# Patient Record
Sex: Female | Born: 1970 | Race: Black or African American | Hispanic: No | Marital: Single | Smoking: Former smoker
Health system: Southern US, Community
[De-identification: ages and names within clinical notes are randomized; demographics above are authoritative.]

## PROBLEM LIST (undated history)

## (undated) DIAGNOSIS — I10 Essential (primary) hypertension: Secondary | ICD-10-CM

## (undated) DIAGNOSIS — K429 Umbilical hernia without obstruction or gangrene: Secondary | ICD-10-CM

## (undated) DIAGNOSIS — E7800 Pure hypercholesterolemia, unspecified: Secondary | ICD-10-CM

## (undated) DIAGNOSIS — R7303 Prediabetes: Secondary | ICD-10-CM

## (undated) DIAGNOSIS — R0602 Shortness of breath: Secondary | ICD-10-CM

## (undated) DIAGNOSIS — E119 Type 2 diabetes mellitus without complications: Secondary | ICD-10-CM

## (undated) DIAGNOSIS — E669 Obesity, unspecified: Secondary | ICD-10-CM

## (undated) DIAGNOSIS — G4730 Sleep apnea, unspecified: Secondary | ICD-10-CM

## (undated) DIAGNOSIS — D649 Anemia, unspecified: Secondary | ICD-10-CM

## (undated) DIAGNOSIS — M549 Dorsalgia, unspecified: Secondary | ICD-10-CM

## (undated) DIAGNOSIS — F419 Anxiety disorder, unspecified: Secondary | ICD-10-CM

## (undated) DIAGNOSIS — R928 Other abnormal and inconclusive findings on diagnostic imaging of breast: Secondary | ICD-10-CM

## (undated) HISTORY — DX: Type 2 diabetes mellitus without complications: E11.9

## (undated) HISTORY — DX: Dorsalgia, unspecified: M54.9

## (undated) HISTORY — DX: Shortness of breath: R06.02

## (undated) HISTORY — PX: TUBAL LIGATION: SHX77

## (undated) HISTORY — DX: Prediabetes: R73.03

## (undated) HISTORY — DX: Obesity, unspecified: E66.9

## (undated) HISTORY — DX: Pure hypercholesterolemia, unspecified: E78.00

## (undated) HISTORY — DX: Sleep apnea, unspecified: G47.30

---
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
## 1997-12-29 ENCOUNTER — Other Ambulatory Visit: Admission: RE | Admit: 1997-12-29 | Discharge: 1997-12-29 | Payer: Self-pay | Admitting: Gynecology

## 1998-02-06 ENCOUNTER — Other Ambulatory Visit: Admission: RE | Admit: 1998-02-06 | Discharge: 1998-02-06 | Payer: Self-pay | Admitting: Gynecology

## 1998-04-17 ENCOUNTER — Other Ambulatory Visit: Admission: RE | Admit: 1998-04-17 | Discharge: 1998-04-17 | Payer: Self-pay | Admitting: Gynecology

## 1998-05-14 ENCOUNTER — Inpatient Hospital Stay (HOSPITAL_COMMUNITY): Admission: AD | Admit: 1998-05-14 | Discharge: 1998-05-18 | Payer: Self-pay | Admitting: Gynecology

## 1998-07-03 ENCOUNTER — Other Ambulatory Visit: Admission: RE | Admit: 1998-07-03 | Discharge: 1998-07-03 | Payer: Self-pay | Admitting: Obstetrics and Gynecology

## 2001-05-01 ENCOUNTER — Other Ambulatory Visit: Admission: RE | Admit: 2001-05-01 | Discharge: 2001-05-01 | Payer: Self-pay | Admitting: Obstetrics and Gynecology

## 2001-12-30 ENCOUNTER — Emergency Department (HOSPITAL_COMMUNITY): Admission: EM | Admit: 2001-12-30 | Discharge: 2001-12-30 | Payer: Self-pay | Admitting: Emergency Medicine

## 2001-12-30 ENCOUNTER — Encounter: Payer: Self-pay | Admitting: Emergency Medicine

## 2002-03-02 ENCOUNTER — Emergency Department (HOSPITAL_COMMUNITY): Admission: EM | Admit: 2002-03-02 | Discharge: 2002-03-02 | Payer: Self-pay | Admitting: Emergency Medicine

## 2005-09-20 ENCOUNTER — Other Ambulatory Visit: Admission: RE | Admit: 2005-09-20 | Discharge: 2005-09-20 | Payer: Self-pay | Admitting: Obstetrics and Gynecology

## 2006-11-14 ENCOUNTER — Other Ambulatory Visit: Admission: RE | Admit: 2006-11-14 | Discharge: 2006-11-14 | Payer: Self-pay | Admitting: Obstetrics and Gynecology

## 2007-12-11 ENCOUNTER — Other Ambulatory Visit: Admission: RE | Admit: 2007-12-11 | Discharge: 2007-12-11 | Payer: Self-pay | Admitting: Obstetrics and Gynecology

## 2009-01-16 ENCOUNTER — Other Ambulatory Visit: Admission: RE | Admit: 2009-01-16 | Discharge: 2009-01-16 | Payer: Self-pay | Admitting: Obstetrics and Gynecology

## 2009-01-16 ENCOUNTER — Ambulatory Visit: Payer: Self-pay | Admitting: Obstetrics and Gynecology

## 2009-01-16 ENCOUNTER — Encounter: Payer: Self-pay | Admitting: Obstetrics and Gynecology

## 2010-03-12 ENCOUNTER — Ambulatory Visit: Payer: Self-pay | Admitting: Obstetrics and Gynecology

## 2010-03-12 ENCOUNTER — Other Ambulatory Visit: Admission: RE | Admit: 2010-03-12 | Discharge: 2010-03-12 | Payer: Self-pay | Admitting: Obstetrics and Gynecology

## 2011-07-01 ENCOUNTER — Encounter: Payer: Self-pay | Admitting: Gynecology

## 2011-07-05 ENCOUNTER — Other Ambulatory Visit: Payer: Self-pay | Admitting: *Deleted

## 2011-07-05 ENCOUNTER — Encounter: Payer: Self-pay | Admitting: Obstetrics and Gynecology

## 2011-07-05 ENCOUNTER — Other Ambulatory Visit (HOSPITAL_COMMUNITY)
Admission: RE | Admit: 2011-07-05 | Discharge: 2011-07-05 | Disposition: A | Discharge status: Home / Self Care | Payer: BC Managed Care – PPO | Source: Ambulatory Visit | Attending: Obstetrics and Gynecology | Admitting: Obstetrics and Gynecology

## 2011-07-05 ENCOUNTER — Ambulatory Visit (INDEPENDENT_AMBULATORY_CARE_PROVIDER_SITE_OTHER): Payer: BC Managed Care – PPO | Admitting: Obstetrics and Gynecology

## 2011-07-05 VITALS — BP 124/72 | Ht 61.0 in | Wt 240.0 lb

## 2011-07-05 DIAGNOSIS — Z309 Encounter for contraceptive management, unspecified: Secondary | ICD-10-CM

## 2011-07-05 DIAGNOSIS — Z01419 Encounter for gynecological examination (general) (routine) without abnormal findings: Secondary | ICD-10-CM | POA: Insufficient documentation

## 2011-07-05 DIAGNOSIS — Z833 Family history of diabetes mellitus: Secondary | ICD-10-CM

## 2011-07-05 DIAGNOSIS — D649 Anemia, unspecified: Secondary | ICD-10-CM

## 2011-07-05 DIAGNOSIS — E7800 Pure hypercholesterolemia, unspecified: Secondary | ICD-10-CM

## 2011-07-05 MED ORDER — FERROUS SULFATE 325 (65 FE) MG PO TABS: 325.0000 mg | ORAL_TABLET | Freq: Every day | ORAL | Status: DC

## 2011-07-05 MED ORDER — NORGESTIM-ETH ESTRAD TRIPHASIC 0.18/0.215/0.25 MG-35 MCG PO TABS: 1.0000 | ORAL_TABLET | Freq: Every day | ORAL | Status: DC

## 2011-07-05 NOTE — Progress Notes (Signed)
The patient came to see me today for her annual GYN exam. She remains on birth control pills with excellent results. She was treated for strep throat elsewhere a month ago. She was treated with a Z-Pak. She is now asymptomatic but her lymph nodes are still slightly swollen.  Physical examination: HEENT within normal limits. Neck: Thyroid not large. Slightly enlarged lymph nodes bilaterally. Supraclavicular nodes: not enlarged. Breasts: Examined in both sitting midline position. No skin changes and no masses. Abdomen: Soft no guarding rebound or masses or hernia. Pelvic: External: Within normal limits. BUS: Within normal limits. Vaginal:within normal limits. Good estrogen effect. No evidence of cystocele rectocele or enterocele. Cervix: clean. Uterus: Normal size and shape. Adnexa: No masses. Rectovaginal exam: Confirmatory and negative. Extremities: Within normal limits.  Assessment: Normal GYN exam. Strep throat treated.  Plan: Continue oral contraceptives. Mammogram at 40. Lipid profile done as cholesterol was slightly elevated last year. Patient was fasted this year. If lymph node swelling does not disappear we will refer her to ENT.

## 2012-05-04 ENCOUNTER — Other Ambulatory Visit: Payer: Self-pay | Admitting: Obstetrics and Gynecology

## 2012-05-04 ENCOUNTER — Other Ambulatory Visit (HOSPITAL_COMMUNITY): Payer: Self-pay | Admitting: *Deleted

## 2012-05-04 DIAGNOSIS — Z1231 Encounter for screening mammogram for malignant neoplasm of breast: Secondary | ICD-10-CM

## 2012-05-07 ENCOUNTER — Ambulatory Visit (INDEPENDENT_AMBULATORY_CARE_PROVIDER_SITE_OTHER): Payer: BC Managed Care – PPO | Admitting: Obstetrics and Gynecology

## 2012-05-07 ENCOUNTER — Encounter: Payer: Self-pay | Admitting: Obstetrics and Gynecology

## 2012-05-07 DIAGNOSIS — N912 Amenorrhea, unspecified: Secondary | ICD-10-CM

## 2012-05-07 DIAGNOSIS — Z113 Encounter for screening for infections with a predominantly sexual mode of transmission: Secondary | ICD-10-CM

## 2012-05-07 NOTE — Addendum Note (Signed)
Addended by: Trellis Paganini on: 05/07/2012 05:07 PM   Modules accepted: Orders

## 2012-05-07 NOTE — Patient Instructions (Signed)
Call for pregnancy test results.

## 2012-05-07 NOTE — Progress Notes (Signed)
Patient came to see me today having not had a cycle for 3 months. She's had in frequent intercourse and uses condoms. Over the past week she started to feel different and felt she might be pregnant. She did a home pregnancy test which was positive. She does not want to be pregnant.  Exam: Kennon Portela present.Pelvic exam: External within normal limits. BUS within normal limits. Vaginal exam within normal limits. Cervix is clean without lesions. Uterus top normal size and shape. Adnexa failed to reveal masses.   Assessment: Probable early  intrauterine pregnancy  Plan: Quantitative hCG. If positive pelvic ultrasound. We did have a preliminary  discussion about therapeutic abortion.

## 2012-05-08 ENCOUNTER — Ambulatory Visit (HOSPITAL_COMMUNITY): Payer: BC Managed Care – PPO

## 2012-05-08 LAB — HIV ANTIBODY (ROUTINE TESTING W REFLEX): HIV: NONREACTIVE

## 2012-05-11 ENCOUNTER — Ambulatory Visit (INDEPENDENT_AMBULATORY_CARE_PROVIDER_SITE_OTHER): Payer: BC Managed Care – PPO | Admitting: Obstetrics and Gynecology

## 2012-05-11 ENCOUNTER — Ambulatory Visit (INDEPENDENT_AMBULATORY_CARE_PROVIDER_SITE_OTHER): Payer: BC Managed Care – PPO

## 2012-05-11 ENCOUNTER — Other Ambulatory Visit: Payer: Self-pay | Admitting: Obstetrics and Gynecology

## 2012-05-11 DIAGNOSIS — O0930 Supervision of pregnancy with insufficient antenatal care, unspecified trimester: Secondary | ICD-10-CM

## 2012-05-11 DIAGNOSIS — O09529 Supervision of elderly multigravida, unspecified trimester: Secondary | ICD-10-CM

## 2012-05-11 DIAGNOSIS — N912 Amenorrhea, unspecified: Secondary | ICD-10-CM

## 2012-05-11 DIAGNOSIS — Z3490 Encounter for supervision of normal pregnancy, unspecified, unspecified trimester: Secondary | ICD-10-CM

## 2012-05-11 DIAGNOSIS — Z331 Pregnant state, incidental: Secondary | ICD-10-CM

## 2012-05-11 DIAGNOSIS — O2690 Pregnancy related conditions, unspecified, unspecified trimester: Secondary | ICD-10-CM

## 2012-05-11 LAB — US OB COMP + 14 WK

## 2012-05-12 NOTE — Progress Notes (Signed)
I reviewed patient's ultrasound which she did in our office yesterday. It shows a 17 week intrauterine pregnancy. An incomplete anatomy survey was done due to gestational age and fetal position. There was good fetal movement. The cervix was long and closed. I called the patient today and told her how far pregnant she was. She had been interested in termination of pregnancy. I told her I did not do terminations that far along. I called Dr. Ilda Mori who also said he did not do them at that point. He suggested she called the abortion clinic in [place]. I discussed this with the patient today. She is now leaning towards having a pregnancy. I referred her to [facility]  OB/GYN. I told her she should be seen immediately. She asked if the baby was normal. I told her we saw her no abnormality but was too soon to be sure and she needed a complete anatomy study within the next 2 weeks.

## 2012-05-12 NOTE — Patient Instructions (Signed)
Call [facility] immediately for prenatal care. If interested in termination call the abortion clinic in [place].

## 2012-05-15 ENCOUNTER — Encounter: Payer: BC Managed Care – PPO | Admitting: Obstetrics and Gynecology

## 2012-05-21 ENCOUNTER — Other Ambulatory Visit: Payer: Self-pay

## 2012-05-21 ENCOUNTER — Other Ambulatory Visit (HOSPITAL_COMMUNITY): Payer: Self-pay | Admitting: Obstetrics and Gynecology

## 2012-05-21 DIAGNOSIS — Z3689 Encounter for other specified antenatal screening: Secondary | ICD-10-CM

## 2012-05-21 DIAGNOSIS — O4400 Placenta previa specified as without hemorrhage, unspecified trimester: Secondary | ICD-10-CM

## 2012-05-21 DIAGNOSIS — O09529 Supervision of elderly multigravida, unspecified trimester: Secondary | ICD-10-CM

## 2012-05-21 LAB — OB RESULTS CONSOLE ABO/RH

## 2012-05-21 LAB — OB RESULTS CONSOLE GC/CHLAMYDIA
Chlamydia: NEGATIVE
Gonorrhea: NEGATIVE

## 2012-05-26 ENCOUNTER — Ambulatory Visit (HOSPITAL_COMMUNITY)
Admission: RE | Admit: 2012-05-26 | Discharge: 2012-05-26 | Disposition: A | Discharge status: Home / Self Care | Payer: Medicaid Other | Source: Ambulatory Visit | Attending: Obstetrics and Gynecology | Admitting: Obstetrics and Gynecology

## 2012-05-26 ENCOUNTER — Encounter (HOSPITAL_COMMUNITY): Payer: Self-pay

## 2012-05-26 DIAGNOSIS — O09529 Supervision of elderly multigravida, unspecified trimester: Secondary | ICD-10-CM

## 2012-05-26 DIAGNOSIS — Z363 Encounter for antenatal screening for malformations: Secondary | ICD-10-CM | POA: Insufficient documentation

## 2012-05-26 DIAGNOSIS — O99210 Obesity complicating pregnancy, unspecified trimester: Secondary | ICD-10-CM | POA: Insufficient documentation

## 2012-05-26 DIAGNOSIS — Z1389 Encounter for screening for other disorder: Secondary | ICD-10-CM | POA: Insufficient documentation

## 2012-05-26 DIAGNOSIS — Z3689 Encounter for other specified antenatal screening: Secondary | ICD-10-CM

## 2012-05-26 DIAGNOSIS — E669 Obesity, unspecified: Secondary | ICD-10-CM | POA: Insufficient documentation

## 2012-05-26 DIAGNOSIS — O358XX0 Maternal care for other (suspected) fetal abnormality and damage, not applicable or unspecified: Secondary | ICD-10-CM | POA: Insufficient documentation

## 2012-05-26 DIAGNOSIS — O4400 Placenta previa specified as without hemorrhage, unspecified trimester: Secondary | ICD-10-CM | POA: Insufficient documentation

## 2012-05-26 DIAGNOSIS — O0930 Supervision of pregnancy with insufficient antenatal care, unspecified trimester: Secondary | ICD-10-CM | POA: Insufficient documentation

## 2012-05-26 DIAGNOSIS — O34219 Maternal care for unspecified type scar from previous cesarean delivery: Secondary | ICD-10-CM | POA: Insufficient documentation

## 2012-07-16 ENCOUNTER — Other Ambulatory Visit (HOSPITAL_COMMUNITY): Payer: Self-pay | Admitting: Obstetrics and Gynecology

## 2012-07-16 DIAGNOSIS — O4420 Partial placenta previa NOS or without hemorrhage, unspecified trimester: Secondary | ICD-10-CM

## 2012-07-16 DIAGNOSIS — O09529 Supervision of elderly multigravida, unspecified trimester: Secondary | ICD-10-CM

## 2012-07-20 ENCOUNTER — Ambulatory Visit (HOSPITAL_COMMUNITY)
Admission: RE | Admit: 2012-07-20 | Discharge: 2012-07-20 | Disposition: A | Discharge status: Home / Self Care | Payer: Medicaid Other | Source: Ambulatory Visit | Attending: Obstetrics and Gynecology | Admitting: Obstetrics and Gynecology

## 2012-07-20 DIAGNOSIS — O09529 Supervision of elderly multigravida, unspecified trimester: Secondary | ICD-10-CM

## 2012-07-20 DIAGNOSIS — O4400 Placenta previa specified as without hemorrhage, unspecified trimester: Secondary | ICD-10-CM | POA: Insufficient documentation

## 2012-07-20 DIAGNOSIS — O4420 Partial placenta previa NOS or without hemorrhage, unspecified trimester: Secondary | ICD-10-CM

## 2012-07-20 DIAGNOSIS — E669 Obesity, unspecified: Secondary | ICD-10-CM | POA: Insufficient documentation

## 2012-07-20 DIAGNOSIS — O34219 Maternal care for unspecified type scar from previous cesarean delivery: Secondary | ICD-10-CM | POA: Insufficient documentation

## 2012-07-20 DIAGNOSIS — O0930 Supervision of pregnancy with insufficient antenatal care, unspecified trimester: Secondary | ICD-10-CM | POA: Insufficient documentation

## 2012-07-20 DIAGNOSIS — O99210 Obesity complicating pregnancy, unspecified trimester: Secondary | ICD-10-CM | POA: Insufficient documentation

## 2012-07-20 NOTE — Progress Notes (Addendum)
Ms. Venuti had an ultrasound appointment today.  Please see [facility]-OB/[facility] report for details.  Comments There is an active singleton fetus with no apparent dysmorphic features on today's routine anatomic re-examination.  The biometry suggests a fetus with an EFW at the approximately 65th percentile for gestational age.    Impression Active singleton fetus. Normal interval growth. Normal amniotic fluid volume Placenta is implanted along the left lateral uterine wall without evidence of previa. Today's images complete our anatomic survey without apparent structural defect. Extreme AMA (>36 years old)  Recommendations 1. Repeat interval growth assessment by ultrasound is recommended for your patient in 4 weeks in context of extremely advanced AMA. 2. Follow as clinically indicated.  Rogelia Boga, MD, MS, FACOG Assistant Professor Section of Maternal-Fetal Medicine [facility]

## 2012-07-20 NOTE — Addendum Note (Signed)
Encounter addended by: Durwin Nora, MD on: 07/20/2012 11:01 AM<BR>     Documentation filed: Notes Section

## 2012-07-24 ENCOUNTER — Other Ambulatory Visit: Payer: Self-pay

## 2012-07-29 ENCOUNTER — Encounter: Payer: Medicaid Other | Attending: Obstetrics and Gynecology | Admitting: *Deleted

## 2012-07-29 VITALS — Ht 61.0 in | Wt 267.3 lb

## 2012-07-29 DIAGNOSIS — O99810 Abnormal glucose complicating pregnancy: Secondary | ICD-10-CM | POA: Insufficient documentation

## 2012-07-29 DIAGNOSIS — Z713 Dietary counseling and surveillance: Secondary | ICD-10-CM | POA: Insufficient documentation

## 2012-07-29 DIAGNOSIS — O24419 Gestational diabetes mellitus in pregnancy, unspecified control: Secondary | ICD-10-CM

## 2012-07-30 ENCOUNTER — Encounter: Payer: Self-pay | Admitting: *Deleted

## 2012-07-30 NOTE — Progress Notes (Signed)
  Patient was seen on 07/29/12 for Gestational Diabetes self-management class at the [facility]. The following learning objectives were met by the patient during this course:   States the definition of Gestational Diabetes  States why dietary management is important in controlling blood glucose  Describes the effects each nutrient has on blood glucose levels  Demonstrates ability to create a balanced meal plan  Demonstrates carbohydrate counting   States when to check blood glucose levels  Demonstrates proper blood glucose monitoring techniques  States the effect of stress and exercise on blood glucose levels  States the importance of limiting caffeine and abstaining from alcohol and smoking  Blood glucose monitor given: Cecilie Lowers Lot # [id number] X Exp: 8/14 Blood glucose reading: 118 mg/dL  Patient instructed to monitor glucose levels: FBS: 60 - <90 2 hour: <120  *Patient received handouts:  Nutrition Diabetes and Pregnancy  Carbohydrate Counting List  Patient will be seen for follow-up as needed.

## 2012-07-30 NOTE — Patient Instructions (Signed)
Goals:  Check glucose levels per MD as instructed  Follow Gestational Diabetes Diet as instructed  Call for follow-up as needed    

## 2012-08-14 ENCOUNTER — Other Ambulatory Visit (HOSPITAL_COMMUNITY): Payer: Self-pay | Admitting: Obstetrics and Gynecology

## 2012-08-14 DIAGNOSIS — O09529 Supervision of elderly multigravida, unspecified trimester: Secondary | ICD-10-CM

## 2012-08-17 ENCOUNTER — Ambulatory Visit (HOSPITAL_COMMUNITY): Payer: BC Managed Care – PPO

## 2012-08-18 ENCOUNTER — Ambulatory Visit (HOSPITAL_COMMUNITY)
Admission: RE | Admit: 2012-08-18 | Discharge: 2012-08-18 | Disposition: A | Discharge status: Home / Self Care | Payer: Medicaid Other | Source: Ambulatory Visit | Attending: Obstetrics and Gynecology | Admitting: Obstetrics and Gynecology

## 2012-08-18 DIAGNOSIS — O0930 Supervision of pregnancy with insufficient antenatal care, unspecified trimester: Secondary | ICD-10-CM | POA: Insufficient documentation

## 2012-08-18 DIAGNOSIS — O09529 Supervision of elderly multigravida, unspecified trimester: Secondary | ICD-10-CM | POA: Insufficient documentation

## 2012-08-18 DIAGNOSIS — O34219 Maternal care for unspecified type scar from previous cesarean delivery: Secondary | ICD-10-CM | POA: Insufficient documentation

## 2012-08-18 DIAGNOSIS — E669 Obesity, unspecified: Secondary | ICD-10-CM | POA: Insufficient documentation

## 2012-08-19 ENCOUNTER — Other Ambulatory Visit (HOSPITAL_COMMUNITY): Payer: Self-pay | Admitting: Maternal and Fetal Medicine

## 2012-08-19 DIAGNOSIS — O09529 Supervision of elderly multigravida, unspecified trimester: Secondary | ICD-10-CM

## 2012-08-19 DIAGNOSIS — O99210 Obesity complicating pregnancy, unspecified trimester: Secondary | ICD-10-CM

## 2012-09-18 ENCOUNTER — Ambulatory Visit (HOSPITAL_COMMUNITY)
Admission: RE | Admit: 2012-09-18 | Discharge: 2012-09-18 | Disposition: A | Discharge status: Home / Self Care | Payer: Medicaid Other | Source: Ambulatory Visit | Attending: Maternal and Fetal Medicine | Admitting: Maternal and Fetal Medicine

## 2012-09-18 ENCOUNTER — Encounter (HOSPITAL_COMMUNITY): Payer: Self-pay

## 2012-09-18 DIAGNOSIS — E669 Obesity, unspecified: Secondary | ICD-10-CM | POA: Insufficient documentation

## 2012-09-18 DIAGNOSIS — O0930 Supervision of pregnancy with insufficient antenatal care, unspecified trimester: Secondary | ICD-10-CM | POA: Insufficient documentation

## 2012-09-18 DIAGNOSIS — O09529 Supervision of elderly multigravida, unspecified trimester: Secondary | ICD-10-CM | POA: Insufficient documentation

## 2012-09-18 DIAGNOSIS — O99810 Abnormal glucose complicating pregnancy: Secondary | ICD-10-CM | POA: Insufficient documentation

## 2012-09-18 DIAGNOSIS — O34219 Maternal care for unspecified type scar from previous cesarean delivery: Secondary | ICD-10-CM | POA: Insufficient documentation

## 2012-09-18 DIAGNOSIS — O99210 Obesity complicating pregnancy, unspecified trimester: Secondary | ICD-10-CM

## 2012-09-18 NOTE — Progress Notes (Signed)
Amy Cardenas  was seen today for an ultrasound appointment.  See full report in [facility].  Single IUP at 35 4/7 weeks A1 GDM on diet, advanced maternal age (> 41 yo) Interval growth is appropriate (79th %tile) Normal amniotic fluid volume  Follow-up ultrasounds as clinically indicated.  Recommend beginning antenatal fetal testing next week due to advanced maternal age > 44 yo.  Alpha Gula, MD

## 2012-09-30 ENCOUNTER — Other Ambulatory Visit: Payer: Self-pay | Admitting: Obstetrics and Gynecology

## 2012-10-01 ENCOUNTER — Encounter (HOSPITAL_COMMUNITY): Payer: Self-pay | Admitting: Pharmacist

## 2012-10-13 ENCOUNTER — Encounter (HOSPITAL_COMMUNITY)
Admission: RE | Admit: 2012-10-13 | Discharge: 2012-10-13 | Disposition: A | Discharge status: Home / Self Care | Payer: Medicaid Other | Source: Ambulatory Visit | Attending: Obstetrics and Gynecology | Admitting: Obstetrics and Gynecology

## 2012-10-13 ENCOUNTER — Encounter (HOSPITAL_COMMUNITY): Payer: Self-pay

## 2012-10-13 HISTORY — DX: Anemia, unspecified: D64.9

## 2012-10-13 HISTORY — DX: Anxiety disorder, unspecified: F41.9

## 2012-10-13 LAB — BASIC METABOLIC PANEL
BUN: 7 mg/dL (ref 6–23)
Calcium: 9.6 mg/dL (ref 8.4–10.5)
Creatinine, Ser: 0.7 mg/dL (ref 0.50–1.10)
GFR calc non Af Amer: >90 mL/min (ref >90)
Glucose, Bld: 92 mg/dL (ref 70–99)
Sodium: 136 mEq/L (ref 135–145)

## 2012-10-13 LAB — CBC
HCT: 31.8 % — ABNORMAL LOW (ref 36.0–46.0)
Hemoglobin: 10.3 g/dL — ABNORMAL LOW (ref 12.0–15.0)
RDW: 16.7 % — ABNORMAL HIGH (ref 11.5–15.5)
WBC: 7.7 10*3/uL (ref 4.0–10.5)

## 2012-10-13 LAB — RPR: RPR Ser Ql: NONREACTIVE

## 2012-10-13 LAB — SURGICAL PCR SCREEN: Staphylococcus aureus: NEGATIVE

## 2012-10-13 NOTE — Patient Instructions (Addendum)
   Your procedure is scheduled on: Thursday, Jan 23  Enter through the Main Entrance of [facility] at: 11 am Pick up the phone at the desk and dial [phone] and inform us of your arrival.  Please call this number if you have any problems the morning of surgery: [phone]  Remember: Do not eat food after midnight: Wednesday Do not drink clear liquids after: 830 am Thursday Take these medicines the morning of surgery with a SIP OF WATER:  none  Do not wear jewelry, make-up, or FINGER nail polish No metal in your hair or on your body. Do not wear lotions, powders, perfumes. You may wear deodorant.  Please use your CHG wash as directed prior to surgery.  Do not shave anywhere for at least 12 hours prior to first CHG shower.  Do not bring valuables to the hospital. Contacts, dentures or bridgework may not be worn into surgery.  Leave suitcase in the car. After Surgery it may be brought to your room. For patients being admitted to the hospital, checkout time is 11:00am the day of discharge.  Home with Lester Kinsman or Darrel Hoover.

## 2012-10-14 MED ORDER — DEXTROSE 5 % IV SOLN
3.0000 g | INTRAVENOUS | Status: DC
  Filled 2012-10-14: qty 3000

## 2012-10-14 NOTE — H&P (Addendum)
42 y.o. [redacted]w[redacted]d  G2P1001 comes in for scheduled repeat cesarean section and bilateral tubal ligation for desired permanent sterilization.  Otherwise has good fetal movement and no bleeding.  Pregnancy complicated by AMA, low lying placenta, GDMA1  Past Medical History  Diagnosis Date  . Obesity   . Anemia   . Anxiety     no meds  . Diabetes mellitus without complication     GDM - diet controlled - no meds    Past Surgical History  Procedure Date  . Cesarean section   . Wisdom tooth extraction 1990    OB History    Grav Para Term Preterm Abortions TAB SAB Ect Mult Living   2 1 1       1      # Outc Date GA Lbr Len/2nd Wgt Sex Del Anes PTL Lv   1 TRM 1999 [redacted]w[redacted]d   M CS EPI  Yes   2 CUR               History   Social History  . Marital Status: Single    Spouse Name: N/A    Number of Children: N/A  . Years of Education: N/A   Occupational History  . Not on file.   Social History Main Topics  . Smoking status: Former Smoker -- 0.1 packs/day for 3 years    Types: Cigarettes    Quit date: 09/23/2010  . Smokeless tobacco: Never Used  . Alcohol Use: No  . Drug Use: No  . Sexually Active: Not Currently    Birth Control/ Protection: None, Condom     Comment: pregnant   Other Topics Concern  . Not on file   Social History Narrative  . No narrative on file   [place]    Prenatal Transfer Tool  Maternal Diabetes: Yes:  Diabetes Type:  Diet controlled Genetic Screening: Normal Maternal Ultrasounds/Referrals: Normal anatomy scan Fetal Ultrasounds or other Referrals:  Other:  growth scan 09/18/12 79% (6#8) Maternal Substance Abuse:  No Significant Maternal Medications:  None Significant Maternal Lab Results: None  Other PNC: biweekly NSTs normal    There were no vitals filed for this visit.   Lungs/Cor:  NAD Abdomen:  soft, gravid Ex:  no cords, erythema    A/P   Admit for repeat cesarean section Risks, benefits, alternatives discussed with patient, desires  to proceed with Repeat c/s and BTL. 3g Ancef  GBS Pos Other routine pre-op care  Carl, Luther Parody

## 2012-10-15 ENCOUNTER — Encounter (HOSPITAL_COMMUNITY): Payer: Self-pay | Admitting: Anesthesiology

## 2012-10-15 ENCOUNTER — Inpatient Hospital Stay (HOSPITAL_COMMUNITY): Payer: Medicaid Other | Admitting: Anesthesiology

## 2012-10-15 ENCOUNTER — Inpatient Hospital Stay (HOSPITAL_COMMUNITY)
Admission: AD | Admit: 2012-10-15 | Discharge: 2012-10-18 | DRG: 765 | Disposition: A | Discharge status: Home / Self Care | Payer: Medicaid Other | Source: Ambulatory Visit | Attending: Obstetrics and Gynecology | Admitting: Obstetrics and Gynecology

## 2012-10-15 ENCOUNTER — Encounter (HOSPITAL_COMMUNITY)
Admission: AD | Disposition: A | Discharge status: Home / Self Care | Payer: Self-pay | Source: Ambulatory Visit | Attending: Obstetrics and Gynecology

## 2012-10-15 ENCOUNTER — Encounter (HOSPITAL_COMMUNITY): Payer: Self-pay | Admitting: *Deleted

## 2012-10-15 DIAGNOSIS — O09529 Supervision of elderly multigravida, unspecified trimester: Secondary | ICD-10-CM | POA: Diagnosis present

## 2012-10-15 DIAGNOSIS — O99814 Abnormal glucose complicating childbirth: Secondary | ICD-10-CM | POA: Diagnosis present

## 2012-10-15 DIAGNOSIS — O4410 Placenta previa with hemorrhage, unspecified trimester: Secondary | ICD-10-CM | POA: Diagnosis present

## 2012-10-15 DIAGNOSIS — O34219 Maternal care for unspecified type scar from previous cesarean delivery: Principal | ICD-10-CM | POA: Diagnosis present

## 2012-10-15 DIAGNOSIS — Z302 Encounter for sterilization: Secondary | ICD-10-CM

## 2012-10-15 LAB — GLUCOSE, CAPILLARY
Glucose-Capillary: 82 mg/dL (ref 70–99)
Glucose-Capillary: 89 mg/dL (ref 70–99)

## 2012-10-15 SURGERY — Surgical Case
Anesthesia: Spinal | Wound class: Clean Contaminated

## 2012-10-15 MED ORDER — ONDANSETRON HCL 4 MG/2ML IJ SOLN: 4.0000 mg | Freq: Three times a day (TID) | INTRAMUSCULAR | Status: DC | PRN

## 2012-10-15 MED ORDER — FENTANYL CITRATE 0.05 MG/ML IJ SOLN
INTRAMUSCULAR | Status: DC | PRN
  Administered 2012-10-15: 25 ug via INTRATHECAL

## 2012-10-15 MED ORDER — DIBUCAINE 1 % RE OINT: 1.0000 "application " | TOPICAL_OINTMENT | RECTAL | Status: DC | PRN

## 2012-10-15 MED ORDER — KETOROLAC TROMETHAMINE 30 MG/ML IJ SOLN: 30.0000 mg | Freq: Four times a day (QID) | INTRAMUSCULAR | Status: AC | PRN

## 2012-10-15 MED ORDER — DIPHENHYDRAMINE HCL 50 MG/ML IJ SOLN: 25.0000 mg | INTRAMUSCULAR | Status: DC | PRN

## 2012-10-15 MED ORDER — PHENYLEPHRINE 40 MCG/ML (10ML) SYRINGE FOR IV PUSH (FOR BLOOD PRESSURE SUPPORT)
PREFILLED_SYRINGE | INTRAVENOUS | Status: AC
  Filled 2012-10-15: qty 10

## 2012-10-15 MED ORDER — WITCH HAZEL-GLYCERIN EX PADS: 1.0000 "application " | MEDICATED_PAD | CUTANEOUS | Status: DC | PRN

## 2012-10-15 MED ORDER — OXYTOCIN 40 UNITS IN LACTATED RINGERS INFUSION - SIMPLE MED: 62.5000 mL/h | INTRAVENOUS | Status: AC

## 2012-10-15 MED ORDER — SIMETHICONE 80 MG PO CHEW
80.0000 mg | CHEWABLE_TABLET | Freq: Three times a day (TID) | ORAL | Status: DC
  Administered 2012-10-15 – 2012-10-18 (×10): 80 mg via ORAL

## 2012-10-15 MED ORDER — PHENYLEPHRINE 40 MCG/ML (10ML) SYRINGE FOR IV PUSH (FOR BLOOD PRESSURE SUPPORT)
PREFILLED_SYRINGE | INTRAVENOUS | Status: AC
  Filled 2012-10-15: qty 5

## 2012-10-15 MED ORDER — DIPHENHYDRAMINE HCL 25 MG PO CAPS: 25.0000 mg | ORAL_CAPSULE | Freq: Four times a day (QID) | ORAL | Status: DC | PRN

## 2012-10-15 MED ORDER — KETOROLAC TROMETHAMINE 30 MG/ML IJ SOLN
30.0000 mg | Freq: Four times a day (QID) | INTRAMUSCULAR | Status: AC | PRN
  Administered 2012-10-15 (×2): 30 mg via INTRAVENOUS
  Filled 2012-10-15: qty 1

## 2012-10-15 MED ORDER — GENTAMICIN SULFATE 40 MG/ML IJ SOLN
Freq: Once | INTRAMUSCULAR | Status: AC
  Administered 2012-10-15: 100 mL via INTRAVENOUS
  Filled 2012-10-15: qty 8.84

## 2012-10-15 MED ORDER — FENTANYL CITRATE 0.05 MG/ML IJ SOLN
INTRAMUSCULAR | Status: AC
  Filled 2012-10-15: qty 2

## 2012-10-15 MED ORDER — ONDANSETRON HCL 4 MG PO TABS: 4.0000 mg | ORAL_TABLET | ORAL | Status: DC | PRN

## 2012-10-15 MED ORDER — CLINDAMYCIN PHOSPHATE 900 MG/50ML IV SOLN: 900.0000 mg | Freq: Once | INTRAVENOUS | Status: DC

## 2012-10-15 MED ORDER — OXYTOCIN 10 UNIT/ML IJ SOLN
INTRAMUSCULAR | Status: AC
  Filled 2012-10-15: qty 4

## 2012-10-15 MED ORDER — TETANUS-DIPHTH-ACELL PERTUSSIS 5-2.5-18.5 LF-MCG/0.5 IM SUSP: 0.5000 mL | Freq: Once | INTRAMUSCULAR | Status: DC

## 2012-10-15 MED ORDER — ZOLPIDEM TARTRATE 5 MG PO TABS: 5.0000 mg | ORAL_TABLET | Freq: Every evening | ORAL | Status: DC | PRN

## 2012-10-15 MED ORDER — LANOLIN HYDROUS EX OINT: 1.0000 "application " | TOPICAL_OINTMENT | CUTANEOUS | Status: DC | PRN

## 2012-10-15 MED ORDER — SCOPOLAMINE 1 MG/3DAYS TD PT72
1.0000 | MEDICATED_PATCH | Freq: Once | TRANSDERMAL | Status: DC
  Administered 2012-10-15: 1.5 mg via TRANSDERMAL

## 2012-10-15 MED ORDER — NALBUPHINE HCL 10 MG/ML IJ SOLN: 5.0000 mg | INTRAMUSCULAR | Status: DC | PRN

## 2012-10-15 MED ORDER — SCOPOLAMINE 1 MG/3DAYS TD PT72: 1.0000 | MEDICATED_PATCH | Freq: Once | TRANSDERMAL | Status: DC

## 2012-10-15 MED ORDER — 0.9 % SODIUM CHLORIDE (POUR BTL) OPTIME
TOPICAL | Status: DC | PRN
  Administered 2012-10-15: 1000 mL

## 2012-10-15 MED ORDER — OXYCODONE-ACETAMINOPHEN 5-325 MG PO TABS
1.0000 | ORAL_TABLET | ORAL | Status: DC | PRN
  Administered 2012-10-16 (×3): 1 via ORAL
  Administered 2012-10-16: 2 via ORAL
  Administered 2012-10-17 – 2012-10-18 (×6): 1 via ORAL
  Filled 2012-10-15 (×2): qty 1
  Filled 2012-10-15: qty 2
  Filled 2012-10-15 (×7): qty 1

## 2012-10-15 MED ORDER — LACTATED RINGERS IV SOLN
INTRAVENOUS | Status: DC | PRN
  Administered 2012-10-15 (×3): via INTRAVENOUS

## 2012-10-15 MED ORDER — DIPHENHYDRAMINE HCL 50 MG/ML IJ SOLN: 12.5000 mg | INTRAMUSCULAR | Status: DC | PRN

## 2012-10-15 MED ORDER — KETOROLAC TROMETHAMINE 30 MG/ML IJ SOLN
INTRAMUSCULAR | Status: AC
  Administered 2012-10-15: 30 mg via INTRAVENOUS
  Filled 2012-10-15: qty 1

## 2012-10-15 MED ORDER — PRENATAL MULTIVITAMIN CH
1.0000 | ORAL_TABLET | Freq: Every day | ORAL | Status: DC
  Administered 2012-10-15 – 2012-10-18 (×4): 1 via ORAL
  Filled 2012-10-15 (×4): qty 1

## 2012-10-15 MED ORDER — NALOXONE HCL 0.4 MG/ML IJ SOLN: 0.4000 mg | INTRAMUSCULAR | Status: DC | PRN

## 2012-10-15 MED ORDER — LACTATED RINGERS IV SOLN
INTRAVENOUS | Status: DC | PRN
  Administered 2012-10-15: 13:00:00 via INTRAVENOUS

## 2012-10-15 MED ORDER — GENTAMICIN SULFATE 40 MG/ML IJ SOLN: 1.5000 mg/kg | Freq: Once | INTRAVENOUS | Status: DC

## 2012-10-15 MED ORDER — DIPHENHYDRAMINE HCL 25 MG PO CAPS: 25.0000 mg | ORAL_CAPSULE | ORAL | Status: DC | PRN

## 2012-10-15 MED ORDER — NALOXONE HCL 1 MG/ML IJ SOLN: 1.0000 ug/kg/h | INTRAVENOUS | Status: DC | PRN

## 2012-10-15 MED ORDER — ONDANSETRON HCL 4 MG/2ML IJ SOLN
INTRAMUSCULAR | Status: DC | PRN
  Administered 2012-10-15: 4 mg via INTRAVENOUS

## 2012-10-15 MED ORDER — MORPHINE SULFATE 0.5 MG/ML IJ SOLN
INTRAMUSCULAR | Status: AC
  Filled 2012-10-15: qty 10

## 2012-10-15 MED ORDER — PRENATAL MULTIVITAMIN CH: 1.0000 | ORAL_TABLET | Freq: Every day | ORAL | Status: DC

## 2012-10-15 MED ORDER — METOCLOPRAMIDE HCL 5 MG/ML IJ SOLN: 10.0000 mg | Freq: Three times a day (TID) | INTRAMUSCULAR | Status: DC | PRN

## 2012-10-15 MED ORDER — BUPIVACAINE IN DEXTROSE 0.75-8.25 % IT SOLN
INTRATHECAL | Status: DC | PRN
  Administered 2012-10-15: 1.5 mL via INTRATHECAL

## 2012-10-15 MED ORDER — LACTATED RINGERS IV SOLN
Freq: Once | INTRAVENOUS | Status: AC
  Administered 2012-10-15: 12:00:00 via INTRAVENOUS

## 2012-10-15 MED ORDER — ONDANSETRON HCL 4 MG/2ML IJ SOLN: 4.0000 mg | INTRAMUSCULAR | Status: DC | PRN

## 2012-10-15 MED ORDER — SENNOSIDES-DOCUSATE SODIUM 8.6-50 MG PO TABS
2.0000 | ORAL_TABLET | Freq: Every day | ORAL | Status: DC
  Administered 2012-10-15 – 2012-10-17 (×3): 2 via ORAL

## 2012-10-15 MED ORDER — MORPHINE SULFATE (PF) 0.5 MG/ML IJ SOLN
INTRAMUSCULAR | Status: DC | PRN
  Administered 2012-10-15: .1 mg via INTRATHECAL

## 2012-10-15 MED ORDER — ACETAMINOPHEN 10 MG/ML IV SOLN: 1000.0000 mg | Freq: Four times a day (QID) | INTRAVENOUS | Status: AC | PRN

## 2012-10-15 MED ORDER — IBUPROFEN 600 MG PO TABS
600.0000 mg | ORAL_TABLET | Freq: Four times a day (QID) | ORAL | Status: DC
  Administered 2012-10-16 – 2012-10-18 (×11): 600 mg via ORAL
  Filled 2012-10-15 (×11): qty 1

## 2012-10-15 MED ORDER — MIDAZOLAM HCL 2 MG/2ML IJ SOLN: 0.5000 mg | Freq: Once | INTRAMUSCULAR | Status: DC | PRN

## 2012-10-15 MED ORDER — MEPERIDINE HCL 25 MG/ML IJ SOLN: 6.2500 mg | INTRAMUSCULAR | Status: DC | PRN

## 2012-10-15 MED ORDER — PROMETHAZINE HCL 25 MG/ML IJ SOLN: 6.2500 mg | INTRAMUSCULAR | Status: DC | PRN

## 2012-10-15 MED ORDER — PHENYLEPHRINE HCL 10 MG/ML IJ SOLN
INTRAMUSCULAR | Status: DC | PRN
  Administered 2012-10-15: 40 ug via INTRAVENOUS
  Administered 2012-10-15: 80 ug via INTRAVENOUS
  Administered 2012-10-15 (×3): 40 ug via INTRAVENOUS
  Administered 2012-10-15: 80 ug via INTRAVENOUS
  Administered 2012-10-15: 40 ug via INTRAVENOUS
  Administered 2012-10-15: 80 ug via INTRAVENOUS
  Administered 2012-10-15: 40 ug via INTRAVENOUS
  Administered 2012-10-15: 80 ug via INTRAVENOUS
  Administered 2012-10-15: 40 ug via INTRAVENOUS
  Administered 2012-10-15 (×3): 80 ug via INTRAVENOUS
  Administered 2012-10-15 (×2): 40 ug via INTRAVENOUS
  Administered 2012-10-15: 80 ug via INTRAVENOUS
  Administered 2012-10-15: 40 ug via INTRAVENOUS
  Administered 2012-10-15: 80 ug via INTRAVENOUS

## 2012-10-15 MED ORDER — SODIUM CHLORIDE 0.9 % IJ SOLN: 3.0000 mL | INTRAMUSCULAR | Status: DC | PRN

## 2012-10-15 MED ORDER — ONDANSETRON HCL 4 MG/2ML IJ SOLN
INTRAMUSCULAR | Status: AC
  Filled 2012-10-15: qty 2

## 2012-10-15 MED ORDER — MENTHOL 3 MG MT LOZG: 1.0000 | LOZENGE | OROMUCOSAL | Status: DC | PRN

## 2012-10-15 MED ORDER — SIMETHICONE 80 MG PO CHEW: 80.0000 mg | CHEWABLE_TABLET | ORAL | Status: DC | PRN

## 2012-10-15 MED ORDER — OXYTOCIN 10 UNIT/ML IJ SOLN
40.0000 [IU] | INTRAVENOUS | Status: DC | PRN
  Administered 2012-10-15: 40 [IU] via INTRAVENOUS

## 2012-10-15 MED ORDER — FENTANYL CITRATE 0.05 MG/ML IJ SOLN
INTRAMUSCULAR | Status: AC
  Administered 2012-10-15: 50 ug via INTRAVENOUS
  Filled 2012-10-15: qty 2

## 2012-10-15 MED ORDER — LACTATED RINGERS IV SOLN
INTRAVENOUS | Status: DC
  Administered 2012-10-15 – 2012-10-16 (×2): via INTRAVENOUS

## 2012-10-15 MED ORDER — FENTANYL CITRATE 0.05 MG/ML IJ SOLN
25.0000 ug | INTRAMUSCULAR | Status: DC | PRN
  Administered 2012-10-15 (×2): 50 ug via INTRAVENOUS

## 2012-10-15 MED ORDER — SCOPOLAMINE 1 MG/3DAYS TD PT72
MEDICATED_PATCH | TRANSDERMAL | Status: AC
  Filled 2012-10-15: qty 1

## 2012-10-15 MED ORDER — FERROUS SULFATE 325 (65 FE) MG PO TABS
325.0000 mg | ORAL_TABLET | Freq: Every day | ORAL | Status: DC
  Administered 2012-10-16 – 2012-10-18 (×3): 325 mg via ORAL
  Filled 2012-10-15 (×3): qty 1

## 2012-10-15 SURGICAL SUPPLY — 36 items
CLIP FILSHIE TUBAL LIGA STRL (Clip) ×1 IMPLANT
CLOTH BEACON ORANGE TIMEOUT ST (SAFETY) ×2 IMPLANT
DRAPE LG THREE QUARTER DISP (DRAPES) ×2 IMPLANT
DRESSING TELFA 8X3 (GAUZE/BANDAGES/DRESSINGS) ×2 IMPLANT
DRSG OPSITE POSTOP 4X10 (GAUZE/BANDAGES/DRESSINGS) ×2 IMPLANT
DURAPREP 26ML APPLICATOR (WOUND CARE) ×2 IMPLANT
ELECT REM PT RETURN 9FT ADLT (ELECTROSURGICAL) ×2
ELECTRODE REM PT RTRN 9FT ADLT (ELECTROSURGICAL) ×1 IMPLANT
EXTRACTOR VACUUM M CUP 4 TUBE (SUCTIONS) IMPLANT
GAUZE SPONGE 4X4 12PLY STRL LF (GAUZE/BANDAGES/DRESSINGS) ×4 IMPLANT
GLOVE BIO SURGEON STRL SZ 6.5 (GLOVE) ×4 IMPLANT
GLOVE BIOGEL PI IND STRL 6.5 (GLOVE) ×2 IMPLANT
GLOVE BIOGEL PI IND STRL 7.0 (GLOVE) IMPLANT
GLOVE BIOGEL PI INDICATOR 6.5 (GLOVE) ×3
GLOVE BIOGEL PI INDICATOR 7.0 (GLOVE) ×2
GLOVE INDICATOR 7.0 STRL GRN (GLOVE) ×1 IMPLANT
GOWN PREVENTION PLUS LG XLONG (DISPOSABLE) ×4 IMPLANT
KIT ABG SYR 3ML LUER SLIP (SYRINGE) IMPLANT
NDL HYPO 25X5/8 SAFETYGLIDE (NEEDLE) IMPLANT
NEEDLE HYPO 25X5/8 SAFETYGLIDE (NEEDLE) IMPLANT
NS IRRIG 1000ML POUR BTL (IV SOLUTION) ×2 IMPLANT
PACK C SECTION WH (CUSTOM PROCEDURE TRAY) ×2 IMPLANT
PAD ABD 7.5X8 STRL (GAUZE/BANDAGES/DRESSINGS) IMPLANT
PAD OB MATERNITY 4.3X12.25 (PERSONAL CARE ITEMS) ×2 IMPLANT
RTRCTR C-SECT PINK 25CM LRG (MISCELLANEOUS) ×1 IMPLANT
SLEEVE SCD COMPRESS KNEE MED (MISCELLANEOUS) IMPLANT
STAPLER VISISTAT 35W (STAPLE) ×1 IMPLANT
SUT MON AB 2-0 CT1 27 (SUTURE) ×2 IMPLANT
SUT MON AB 4-0 PS1 27 (SUTURE) IMPLANT
SUT PLAIN 2 0 XLH (SUTURE) ×1 IMPLANT
SUT VIC AB 0 CTX 36 (SUTURE) ×8
SUT VIC AB 0 CTX36XBRD ANBCTRL (SUTURE) ×4 IMPLANT
SUT VIC AB 4-0 KS 27 (SUTURE) IMPLANT
TOWEL OR 17X24 6PK STRL BLUE (TOWEL DISPOSABLE) ×6 IMPLANT
TRAY FOLEY CATH 14FR (SET/KITS/TRAYS/PACK) ×2 IMPLANT
WATER STERILE IRR 1000ML POUR (IV SOLUTION) ×2 IMPLANT

## 2012-10-15 NOTE — Transfer of Care (Signed)
Immediate Anesthesia Transfer of Care Note  Patient: Amy Cardenas  Procedure(s) Performed: Procedure(s) (LRB) with comments: CESAREAN SECTION WITH BILATERAL TUBAL LIGATION (N/A) - Repeat  Patient Location: PACU  Anesthesia Type:Spinal  Level of Consciousness: awake, alert , oriented and patient cooperative  Airway & Oxygen Therapy: Patient Spontanous Breathing  Post-op Assessment: Report given to PACU RN and Post -op Vital signs reviewed and stable  Post vital signs: stable  Complications: No apparent anesthesia complications

## 2012-10-15 NOTE — Progress Notes (Signed)
Preop dx: term repeat c/s with BTL Postop dx: same Procedure: LTCS BTL Surgeon: Delray Alt Assist: M. Horvath EBL: 500 UOP 200 Fluids 2500 Findings: female infant, cephalic, APGARs pending, wt pending, vigorous and crying to neotologist Specimen: cord blood Complications: none Condition: stable to PACU

## 2012-10-15 NOTE — Interval H&P Note (Signed)
History and Physical Interval Note:  10/15/2012 11:18 AM  Amy Cardenas  has presented today for surgery, with the diagnosis of REPEAT [phone] [id number]  The various methods of treatment have been discussed with the patient and family. After consideration of risks, benefits and other options for treatment, the patient has consented to  Procedure(s) (LRB) with comments: CESAREAN SECTION WITH BILATERAL TUBAL LIGATION (N/A) - Repeat as a surgical intervention .  The patient's history has been reviewed, patient examined, no change in status, stable for surgery.  I have reviewed the patient's chart and labs.  Questions were answered to the patient's satisfaction.     Amy Cardenas  No changes to health.  Risks, benefits, complications reviewed with pt and consent reaffirmed.

## 2012-10-15 NOTE — Anesthesia Procedure Notes (Signed)
Spinal  Patient location during procedure: OR Start time: 10/15/2012 12:11 PM Staffing Anesthesiologist: Angus Seller., Harrell Gave. Performed by: anesthesiologist  Preanesthetic Checklist Completed: patient identified, site marked, surgical consent, pre-op evaluation, timeout performed, IV checked, risks and benefits discussed and monitors and equipment checked Spinal Block Patient position: sitting Prep: DuraPrep Patient monitoring: heart rate, cardiac monitor, continuous pulse ox and blood pressure Approach: midline Location: L3-4 Injection technique: single-shot Needle Needle type: Sprotte  Needle gauge: 24 G Needle length: 9 cm Assessment Sensory level: T4 Additional Notes Patient identified.  Risk benefits discussed including failed block, incomplete pain control, headache, nerve damage, paralysis, blood pressure changes, nausea, vomiting, reactions to medication both toxic or allergic, and postpartum back pain.  Patient expressed understanding and wished to proceed.  All questions were answered.  Sterile technique used throughout procedure.  CSF was clear.  No parasthesia or other complications.  Please see nursing notes for vital signs.

## 2012-10-15 NOTE — Anesthesia Preprocedure Evaluation (Signed)
Anesthesia Evaluation  Patient identified by MRN, date of birth, ID band Patient awake    Reviewed: Allergy & Precautions, H&P , NPO status , Patient's Chart, lab work & pertinent test results  Airway Mallampati: III      Dental No notable dental hx.    Pulmonary neg pulmonary ROS,  breath sounds clear to auscultation  Pulmonary exam normal       Cardiovascular Exercise Tolerance: Good negative cardio ROS  Rhythm:regular Rate:Normal     Neuro/Psych PSYCHIATRIC DISORDERS negative neurological ROS  negative psych ROS   GI/Hepatic negative GI ROS, Neg liver ROS,   Endo/Other  negative endocrine ROSdiabetesMorbid obesity  Renal/GU negative Renal ROS  negative genitourinary   Musculoskeletal   Abdominal Normal abdominal exam  (+)   Peds  Hematology negative hematology ROS (+) anemia ,   Anesthesia Other Findings Obesity     Anemia        Anxiety   no meds Diabetes mellitus without complication   GDM - diet controlled - no meds    Reproductive/Obstetrics (+) Pregnancy                           Anesthesia Physical Anesthesia Plan  ASA: III  Anesthesia Plan: Spinal   Post-op Pain Management:    Induction:   Airway Management Planned:   Additional Equipment:   Intra-op Plan:   Post-operative Plan:   Informed Consent: I have reviewed the patients History and Physical, chart, labs and discussed the procedure including the risks, benefits and alternatives for the proposed anesthesia with the patient or authorized representative who has indicated his/her understanding and acceptance.     Plan Discussed with: Anesthesiologist, CRNA and Surgeon  Anesthesia Plan Comments:         Anesthesia Quick Evaluation

## 2012-10-15 NOTE — Anesthesia Postprocedure Evaluation (Signed)
Anesthesia Post Note  Patient: Amy Cardenas  Procedure(s) Performed: Procedure(s) (LRB): CESAREAN SECTION WITH BILATERAL TUBAL LIGATION (N/A)  Anesthesia type: Spinal  Patient location: PACU  Post pain: Pain level controlled  Post assessment: Post-op Vital signs reviewed  Last Vitals:  Filed Vitals:   10/15/12 1445  BP: 127/74  Pulse: 86  Temp:   Resp: 17    Post vital signs: Reviewed  Level of consciousness: awake  Complications: No apparent anesthesia complications

## 2012-10-16 ENCOUNTER — Encounter (HOSPITAL_COMMUNITY): Payer: Self-pay | Admitting: Obstetrics and Gynecology

## 2012-10-16 LAB — CBC
HCT: 27.1 % — ABNORMAL LOW (ref 36.0–46.0)
Hemoglobin: 8.7 g/dL — ABNORMAL LOW (ref 12.0–15.0)
MCH: 25.1 pg — ABNORMAL LOW (ref 26.0–34.0)
MCHC: 32.1 g/dL (ref 30.0–36.0)
MCV: 78.3 fL (ref 78.0–100.0)
Platelets: 251 10*3/uL (ref 150–400)
RBC: 3.46 MIL/uL — ABNORMAL LOW (ref 3.87–5.11)
RDW: 16.8 % — ABNORMAL HIGH (ref 11.5–15.5)
WBC: 8.5 10*3/uL (ref 4.0–10.5)

## 2012-10-16 NOTE — Discharge Summary (Signed)
Obstetric Discharge Summary Reason for Admission: cesarean section Prenatal Procedures: none Intrapartum Procedures: cesarean: low cervical, transverse, Tubal sterilization Postpartum Procedures: none Complications-Operative and Postpartum: none Hemoglobin  Date Value Range Status  10/16/2012 8.7* 12.0 - 15.0 g/dL Final     HCT  Date Value Range Status  10/16/2012 27.1* 36.0 - 46.0 % Final    Physical Exam:  General: alert Lochia: appropriate Uterine Fundus: firm Incision: healing well DVT Evaluation: No evidence of DVT seen on physical exam.  Discharge Diagnoses: Term Pregnancy-delivered, Previous C/S.  Discharge Information: Date: 10/16/2012 Activity: Limited Diet: routine Medications: PNV, Ibuprofen and Percocet Condition: stable Instructions: refer to practice specific booklet Discharge to: home Follow-up Information    Follow up with CALLAHAN, SIDNEY, DO. Schedule an appointment as soon as possible for a visit in 4 weeks.   Contact information:   [address] [place] [address] [phone]          Newborn Data: Live born female  Birth Weight: 8 lb 12.9 oz (3995 g) APGAR: 9, 9  Home with mother.  Amy Cardenas D 10/16/2012, 9:05 PM

## 2012-10-16 NOTE — Progress Notes (Signed)
Post Op Day 1 Subjective: no complaints  Objective: Blood pressure 124/76, pulse 96, temperature 97.8 F (36.6 C), temperature source Oral, resp. rate 20, weight 273 lb (123.832 kg), last menstrual period 01/22/2012, SpO2 97.00%, unknown if currently breastfeeding.  Physical Exam:  General: alert Lochia: appropriate Uterine Fundus: firm Incision: healing well   Basename 10/16/12 0600 10/13/12 1345  HGB 8.7* 10.3*  HCT 27.1* 31.8*    Assessment/Plan: Plan for discharge tomorrow   LOS: 1 day   Cheick Suhr D 10/16/2012, 10:55 AM

## 2012-10-16 NOTE — Op Note (Signed)
NAME:  Amy Cardenas, Amy Cardenas                  ACCOUNT NO.:  [id number]  MEDICAL RECORD NO.:  [id number]  LOCATION:  9118                          FACILITY:  [facility]  PHYSICIAN:  Philip Aspen, DO    DATE OF BIRTH:  [birth year]  DATE OF PROCEDURE:  10/15/2012 DATE OF DISCHARGE:                              OPERATIVE REPORT   PREOPERATIVE DIAGNOSES:  Term pregnancy, elective repeat cesarean section with bilateral tubal ligation.  SURGEON:  Philip Aspen, DO  ASSISTANT:  Carrington Clamp, MD  ANESTHESIA:  Spinal.  ESTIMATED BLOOD LOSS:  500 mL.  URINE OUTPUT:  200 mL clear urine.  FLUIDS:  2500 mL.  FINDINGS:  Female infant, cephalic presentation with Apgars and weight pending but vigorous at time of delivery.  Normal tubes and ovaries bilaterally.  SPECIMENS:  Cord blood.  COMPLICATIONS:  None.  CONDITION:  Stable to PACU.  DESCRIPTION OF PROCEDURE:  Risks, benefits, expectations, potential complications were reviewed with the patient and consent was reaffirmed. The patient was taken to the operating room, where spinal anesthesia was administered and found to be adequate.  She was then prepped and draped in the normal sterile fashion in dorsal supine position with a leftward tilt.  As part of the preparation, the patient's pannus was elevated by taping in the cephalic direction so that the abdomen was easily visualized.  The patient was tested.  Spinal was adequate.  The scalp was used to create the Pfannenstiel skin incision.  Bovie was used to incise the subcutaneous tissue down to the fascia.  It was noted that intra-abdominal contents consisting of omental tissue had extruded through the fascial layer to the subcutaneous layer.  This was dissected off carefully from the fascia.  No additional adhesions were noted.  The fascial incision was extended laterally, bilaterally with the use of Mayo scissors.  An Alexis self retractor was placed.  The uterine contour examined  manually prior to placement of the Alexis retractor. The fascia was tented.  Kocher clamps and rectus muscles were dissected off bluntly and sharply.  A low-transverse cesarean incision was made with the scalpel and the use of Allis clamps to elevate incision.  The amniotic sac was entered sharply.  This incision was extended laterally with cephalic and caudal traction.  The infant's head was elevated and delivered without difficulty from the pelvis followed by the remainder of the body.  The cord was clamped and cut.  Baby was handed off to awaiting Neonatology.  Cord blood was collected and started gentle external massage on the uterus, performed gentle traction on the cord, and the placenta was delivered without difficulty.  The uterine cavity was cleared of all clot and debris and the uterine incision was closed with Vicryl in a running locked fashion.  Hemostasis was obtained.  The right tube was then grasped with Tanja Port and Filshie clip placed with confirmation and it had surrounded the entire tube onto to the mesosalpinx.  The same procedure was performed on the patient's right tube.  The Alexis self retractor was then removed and the incision was reexamined and found to be hemostatic.  Due to prior scar tissue, there was no clear peritoneal  edge along the patient's left hand side. Therefore, the peritoneum was not closed.  The rectus muscles were reapproximated with Monocryl suture.  The fascia was then closed with 0 Vicryl in a running, locked fashion.  The subcutaneous tissue was then irrigated, dried, and hemostasis achieved with use of Bovie cautery. The space at each lateral angle was closed by interrupted sutures of chromic subcutaneously.  The skin was then undermined with Bovie cautery along the patient's inferior left lateral side so that good approximation of the skin could be achieved.  Skin was then closed with staples.  The patient tolerated the procedure well.  The  sponge, lap, and needle counts were correct x2.  The patient was taken to recovery in stable condition.          ______________________________ Philip Aspen, DO     Rice/MEDQ  D:  10/15/2012  T:  10/16/2012  Job:  [id number]

## 2012-10-16 NOTE — Progress Notes (Signed)
Patient was referred for history of depression/anxiety.  * Referral screened out by Clinical Social Worker because none of the following criteria appear to apply:  ~ History of anxiety/depression during this pregnancy, or of post-partum depression.  ~ Diagnosis of anxiety and/or depression within last 3 years  ~ History of depression due to pregnancy loss/loss of child  OR  * Patient's symptoms currently being treated with medication and/or therapy.  Please contact the Clinical Social Worker if needs arise, or by the patient's request.  Pt's anxiety was situational, regarding pregnancy. Symptoms were brief, as per pt. She denies any depression & reports feeling fine.

## 2012-10-16 NOTE — Anesthesia Postprocedure Evaluation (Signed)
  Anesthesia Post-op Note  Patient: Amy Cardenas  Procedure(s) Performed: Procedure(s) (LRB) with comments: CESAREAN SECTION WITH BILATERAL TUBAL LIGATION (N/A) - Repeat  Patient Location: PACU and Mother/Baby  Anesthesia Type:Spinal  Level of Consciousness: awake  Airway and Oxygen Therapy: Patient Spontanous Breathing  Post-op Pain: none  Post-op Assessment: Patient's Cardiovascular Status Stable, Respiratory Function Stable, Patent Airway, No signs of Nausea or vomiting, Adequate PO intake, Pain level controlled, No headache, No backache, No residual numbness and No residual motor weakness  Post-op Vital Signs: Reviewed and stable  Complications: No apparent anesthesia complications

## 2012-10-16 NOTE — Addendum Note (Signed)
Addendum  created 10/16/12 0805 by Suella Grove, CRNA   Modules edited:Notes Section

## 2012-10-17 ENCOUNTER — Encounter (HOSPITAL_COMMUNITY): Payer: Self-pay

## 2012-10-17 LAB — TYPE AND SCREEN
ABO/RH(D): O POS
Antibody Screen: NEGATIVE
Unit division: 0

## 2012-10-17 MED ORDER — BISACODYL 10 MG RE SUPP
10.0000 mg | Freq: Once | RECTAL | Status: AC
  Administered 2012-10-17: 10 mg via RECTAL
  Filled 2012-10-17: qty 1

## 2012-10-17 NOTE — Progress Notes (Signed)
Post Op Day 2 Subjective: no complaints  Objective: Blood pressure 111/77, pulse 97, temperature 98.1 F (36.7 C), temperature source Oral, resp. rate 18, height 5\' 1"  (1.549 m), weight 273 lb (123.832 kg), last menstrual period 01/22/2012, SpO2 98.00%, unknown if currently breastfeeding.  Physical Exam:  General: alert Lochia: appropriate Uterine Fundus: firm Incision: healing well   Basename 10/16/12 0600  HGB 8.7*  HCT 27.1*    Assessment/Plan: Plan for discharge tomorrow   LOS: 2 days   Amy Cardenas D 10/17/2012, 10:02 AM

## 2012-10-18 MED ORDER — OXYCODONE-ACETAMINOPHEN 5-325 MG PO TABS: 1.0000 | ORAL_TABLET | ORAL | Status: DC | PRN

## 2012-10-19 NOTE — Progress Notes (Signed)
Post discharge chart review completed.  

## 2012-10-29 ENCOUNTER — Encounter (HOSPITAL_COMMUNITY)
Admission: RE | Admit: 2012-10-29 | Discharge: 2012-10-29 | Disposition: A | Discharge status: Home / Self Care | Payer: Medicaid Other | Source: Ambulatory Visit | Attending: Obstetrics and Gynecology | Admitting: Obstetrics and Gynecology

## 2012-10-29 DIAGNOSIS — O923 Agalactia: Secondary | ICD-10-CM | POA: Insufficient documentation

## 2012-11-27 ENCOUNTER — Encounter (HOSPITAL_COMMUNITY)
Admission: RE | Admit: 2012-11-27 | Discharge: 2012-11-27 | Disposition: A | Discharge status: Home / Self Care | Payer: BC Managed Care – PPO | Source: Ambulatory Visit | Attending: Obstetrics and Gynecology | Admitting: Obstetrics and Gynecology

## 2012-11-27 DIAGNOSIS — O923 Agalactia: Secondary | ICD-10-CM | POA: Insufficient documentation

## 2013-02-08 ENCOUNTER — Telehealth: Payer: Self-pay | Admitting: Nurse Practitioner

## 2013-02-08 NOTE — Telephone Encounter (Signed)
APPT MADE

## 2013-02-09 ENCOUNTER — Ambulatory Visit: Payer: Self-pay | Admitting: Nurse Practitioner

## 2013-03-13 ENCOUNTER — Emergency Department (HOSPITAL_COMMUNITY)
Admission: EM | Admit: 2013-03-13 | Discharge: 2013-03-13 | Disposition: A | Discharge status: Home / Self Care | Payer: Medicaid Other | Attending: Emergency Medicine | Admitting: Emergency Medicine

## 2013-03-13 ENCOUNTER — Encounter (HOSPITAL_COMMUNITY): Payer: Self-pay | Admitting: Emergency Medicine

## 2013-03-13 DIAGNOSIS — Y9289 Other specified places as the place of occurrence of the external cause: Secondary | ICD-10-CM | POA: Insufficient documentation

## 2013-03-13 DIAGNOSIS — Z8659 Personal history of other mental and behavioral disorders: Secondary | ICD-10-CM | POA: Insufficient documentation

## 2013-03-13 DIAGNOSIS — Z862 Personal history of diseases of the blood and blood-forming organs and certain disorders involving the immune mechanism: Secondary | ICD-10-CM | POA: Insufficient documentation

## 2013-03-13 DIAGNOSIS — W57XXXA Bitten or stung by nonvenomous insect and other nonvenomous arthropods, initial encounter: Secondary | ICD-10-CM

## 2013-03-13 DIAGNOSIS — S90569A Insect bite (nonvenomous), unspecified ankle, initial encounter: Secondary | ICD-10-CM | POA: Insufficient documentation

## 2013-03-13 DIAGNOSIS — Z880 Allergy status to penicillin: Secondary | ICD-10-CM | POA: Insufficient documentation

## 2013-03-13 DIAGNOSIS — E669 Obesity, unspecified: Secondary | ICD-10-CM | POA: Insufficient documentation

## 2013-03-13 DIAGNOSIS — Z87891 Personal history of nicotine dependence: Secondary | ICD-10-CM | POA: Insufficient documentation

## 2013-03-13 DIAGNOSIS — Y9389 Activity, other specified: Secondary | ICD-10-CM | POA: Insufficient documentation

## 2013-03-13 MED ORDER — SULFAMETHOXAZOLE-TRIMETHOPRIM 800-160 MG PO TABS: 1.0000 | ORAL_TABLET | Freq: Two times a day (BID) | ORAL | Status: DC

## 2013-03-13 MED ORDER — PREDNISONE 10 MG PO TABS: ORAL_TABLET | ORAL | Status: DC

## 2013-03-13 MED ORDER — SULFAMETHOXAZOLE-TMP DS 800-160 MG PO TABS
1.0000 | ORAL_TABLET | Freq: Once | ORAL | Status: AC
  Administered 2013-03-13: 1 via ORAL
  Filled 2013-03-13: qty 1

## 2013-03-13 MED ORDER — DIPHENHYDRAMINE HCL 25 MG PO CAPS
25.0000 mg | ORAL_CAPSULE | Freq: Once | ORAL | Status: AC
  Administered 2013-03-13: 25 mg via ORAL
  Filled 2013-03-13: qty 1

## 2013-03-13 NOTE — ED Provider Notes (Signed)
History     CSN: [id number]  Arrival date & time 03/13/13  [place]   First MD Initiated Contact with Patient 03/13/13 1935      Chief Complaint  Patient presents with  . Insect Bite    possible insect bite x 2 days ago    (Consider location/radiation/quality/duration/timing/severity/associated sxs/prior treatment) HPI Comments: Amy Cardenas is a 42 y.o. female who presents to the Emergency Department complaining of redness, pain and itching to the lateral right lower leg.  Sx's began 3 days ago.  Pain with weight bearing.  She states that she was bitten by some type of insect.  She denies swelling, fever, chills or joint pain.    The history is provided by the patient.    Past Medical History  Diagnosis Date  . Obesity   . Anemia   . Anxiety     no meds    Past Surgical History  Procedure Laterality Date  . Cesarean section    . Wisdom tooth extraction  1990  . Cesarean section with bilateral tubal ligation  10/15/2012    Procedure: CESAREAN SECTION WITH BILATERAL TUBAL LIGATION;  Surgeon: Philip Aspen, DO;  Location: [facility];  Service: Obstetrics;  Laterality: N/A;  Repeat    Family History  Problem Relation Age of Onset  . Diabetes Father   . Colon cancer Maternal Grandmother     History  Substance Use Topics  . Smoking status: Former Smoker -- 0.15 packs/day for 3 years    Types: Cigarettes    Quit date: 09/23/2010  . Smokeless tobacco: Never Used  . Alcohol Use: No    OB History   Grav Para Term Preterm Abortions TAB SAB Ect Mult Living   2 2 2       2       Review of Systems  Constitutional: Negative for fever and chills.  Gastrointestinal: Negative for nausea and vomiting.  Musculoskeletal: Negative for joint swelling and arthralgias.  Skin: Positive for color change.       Insect bite to the right LE  Hematological: Negative for adenopathy.  All other systems reviewed and are negative.    Allergies  Penicillins  Home Medications   Current  Outpatient Rx  Name  Route  Sig  Dispense  Refill  . ferrous sulfate 325 (65 FE) MG tablet   Oral   Take 325 mg by mouth daily with breakfast.         . oxyCODONE-acetaminophen (PERCOCET/ROXICET) 5-325 MG per tablet   Oral   Take 1-2 tablets by mouth every 4 (four) hours as needed (moderate - severe pain).   25 tablet   0   . Prenatal Vit-Fe Fumarate-FA (PRENATAL MULTIVITAMIN) TABS   Oral   Take 1 tablet by mouth daily.           BP 141/92  Pulse 84  Temp(Src) 99.3 F (37.4 C) (Oral)  Ht 5\' 1"  (1.549 m)  Wt 260 lb (117.935 kg)  BMI 49.15 kg/m2  SpO2 100%  LMP 02/11/2013  Breastfeeding? No  Physical Exam  Nursing note and vitals reviewed. Constitutional: She is oriented to person, place, and time. She appears well-developed and well-nourished. No distress.  HENT:  Head: Normocephalic and atraumatic.  Cardiovascular: Normal rate, regular rhythm, normal heart sounds and intact distal pulses.   No murmur heard. Pulmonary/Chest: Effort normal and breath sounds normal. No respiratory distress.  Musculoskeletal: Normal range of motion. She exhibits no edema.  Neurological: She is alert and  oriented to person, place, and time. She exhibits normal muscle tone. Coordination normal.  Skin: Skin is warm. There is erythema.  Localized maculopapular lesion to the right lower leg. Several vesicles present.   No distal edema, red streaks or induration.  No ulceration or eschar.      ED Course  Procedures (including critical care time)  Labs Reviewed - No data to display No results found.      MDM     10 cm area of erythema to the lateral right lower leg.  Several small vesicles present to center.  No drainage, pus, induration or fluctuance.  Leading edge of erythema marked by me.  Advised pt to elevate her leg, minimize weight bearing and return here tomorrow for recheck if the redness is spreading.     Hx is convincing for likely insect bite/sting.  Doubt zoster, no  necrosis or ulceration present.  Skin intact.    Jenika Chiem L. Trisha Mangle, PA-C 03/15/13 2123

## 2013-03-13 NOTE — ED Notes (Signed)
Possible insect bite or sting x 3 days ago.  Swelling, redness, and warm to touch.

## 2013-03-17 NOTE — ED Provider Notes (Signed)
Medical screening examination/treatment/procedure(s) were performed by non-physician practitioner and as supervising physician I was immediately available for consultation/collaboration.  Kinzleigh Kandler, MD 03/17/13 0831 

## 2013-05-25 ENCOUNTER — Ambulatory Visit: Payer: Self-pay | Admitting: General Practice

## 2013-06-01 ENCOUNTER — Ambulatory Visit (INDEPENDENT_AMBULATORY_CARE_PROVIDER_SITE_OTHER): Payer: Medicaid Other | Admitting: General Practice

## 2013-06-01 ENCOUNTER — Encounter: Payer: Self-pay | Admitting: General Practice

## 2013-06-01 VITALS — BP 137/88 | HR 87 | Temp 97.7°F | Ht 62.0 in | Wt 268.5 lb

## 2013-06-01 DIAGNOSIS — Z8249 Family history of ischemic heart disease and other diseases of the circulatory system: Secondary | ICD-10-CM

## 2013-06-01 DIAGNOSIS — R5381 Other malaise: Secondary | ICD-10-CM

## 2013-06-01 DIAGNOSIS — Z862 Personal history of diseases of the blood and blood-forming organs and certain disorders involving the immune mechanism: Secondary | ICD-10-CM

## 2013-06-01 DIAGNOSIS — R635 Abnormal weight gain: Secondary | ICD-10-CM

## 2013-06-01 DIAGNOSIS — Z833 Family history of diabetes mellitus: Secondary | ICD-10-CM

## 2013-06-01 DIAGNOSIS — Z0000 Encounter for general adult medical examination without abnormal findings: Secondary | ICD-10-CM

## 2013-06-01 DIAGNOSIS — O24419 Gestational diabetes mellitus in pregnancy, unspecified control: Secondary | ICD-10-CM

## 2013-06-01 LAB — POCT CBC
Granulocyte percent: 62 %G (ref 37–80)
Lymph, poc: 2.2 (ref 0.6–3.4)
MCV: 70.1 fL — AB (ref 80–97)
MPV: 7.7 fL (ref 0–99.8)
POC LYMPH PERCENT: 32.5 %L (ref 10–50)
Platelet Count, POC: 400 10*3/uL (ref 142–424)
RBC: 4.9 M/uL (ref 4.04–5.48)

## 2013-06-01 NOTE — Progress Notes (Signed)
  Subjective:    Patient ID: Amy Cardenas, female    DOB: 09-May-1971, 42 y.o.   MRN: [id number]  HPI Patient presents today with complaints of weight gain and inability to loose weight. She recently had a baby and lost 10 pounds since January. She denies eating three meals a day (may eat a protein bar and not eat again until night) or regular exercise. She reports blood pressure normal ranges 120's/60's. Reports being tired and having lack of energy.  Reports last menstrual cycle was May 15, 2013.   Review of Systems  Constitutional: Negative for fever and chills.  HENT: Negative for neck pain and neck stiffness.   Respiratory: Negative for chest tightness and shortness of breath.   Cardiovascular: Negative for chest pain and palpitations.  Gastrointestinal: Negative for abdominal pain.  Genitourinary: Negative for dysuria, hematuria and difficulty urinating.  Musculoskeletal: Positive for back pain.  Neurological: Negative for dizziness, weakness and headaches.       Objective:   Physical Exam  Constitutional: She is oriented to person, place, and time. She appears well-developed and well-nourished.  HENT:  Head: Normocephalic and atraumatic.  Right Ear: External ear normal.  Left Ear: External ear normal.  Nose: Nose normal.  Mouth/Throat: Oropharynx is clear and moist.  Eyes: EOM are normal. Pupils are equal, round, and reactive to light.  Neck: Normal range of motion. Neck supple. No thyromegaly present.  Cardiovascular: Normal rate, regular rhythm and normal heart sounds.   Pulmonary/Chest: Effort normal and breath sounds normal. No respiratory distress. She exhibits no tenderness.  Abdominal: Soft. Bowel sounds are normal. She exhibits no distension. There is no tenderness.  Obese abdomen  Musculoskeletal: She exhibits no edema and no tenderness.  Lymphadenopathy:    She has no cervical adenopathy.  Neurological: She is alert and oriented to person, place, and time.   Skin: Skin is warm and dry.  Psychiatric: She has a normal mood and affect.          Assessment & Plan:  1. Family history of hypertension - CMP14+EGFR  2. Family history of diabetes mellitus - POCT glycosylated hemoglobin (Hb A1C)  3. Gestational diabetes - POCT glycosylated hemoglobin (Hb A1C)  4. History of anemia - POCT CBC  5. Weight gain -Discussed healthy eating and regular exercise -Discussed risks and provided information sheets on Belvique and Adiphex -Patient to schedule appointment with Babette Relic or Marcelino Duster to discuss nutrition -Patient to follow up after nutrition consult -Patient verbalized understanding -Coralie Keens, FNP-C

## 2013-06-01 NOTE — Patient Instructions (Addendum)
Phentermine tablets or capsules What is this medicine? PHENTERMINE (FEN ter meen) decreases your appetite. It is used with a reduced calorie diet and exercise to help you lose weight. This medicine may be used for other purposes; ask your health care provider or pharmacist if you have questions. What should I tell my health care provider before I take this medicine? They need to know if you have any of these conditions: -agitation -glaucoma -heart disease -high blood pressure -history of substance abuse -lung disease called Primary Pulmonary Hypertension (PPH) -taken an MAOI like Carbex, Eldepryl, Marplan, Nardil, or Parnate in last 14 days -thyroid disease -an unusual or allergic reaction to phentermine, other medicines, foods, dyes, or preservatives -pregnant or trying to get pregnant -breast-feeding How should I use this medicine? Take this medicine by mouth with a glass of water. Follow the directions on the prescription label. This medicine is usually taken 30 minutes before or 1 to 2 hours after breakfast. Avoid taking this medicine in the evening. It may interfere with sleep. Take your doses at regular intervals. Do not take your medicine more often than directed. Talk to your pediatrician regarding the use of this medicine in children. Special care may be needed. Overdosage: If you think you have taken too much of this medicine contact a poison control center or emergency room at once. NOTE: This medicine is only for you. Do not share this medicine with others. What if I miss a dose? If you miss a dose, take it as soon as you can. If it is almost time for your next dose, take only that dose. Do not take double or extra doses. What may interact with this medicine? Do not take this medicine with any of the following medications: -duloxetine -MAOIs like Carbex, Eldepryl, Marplan, Nardil, and Parnate -medicines for colds or breathing difficulties like pseudoephedrine or  phenylephrine -procarbazine -sibutramine -SSRIs like citalopram, escitalopram, fluoxetine, fluvoxamine, paroxetine, and sertraline -stimulants like dexmethylphenidate, methylphenidate or modafinil -venlafaxine This medicine may also interact with the following medications: -medicines for diabetes This list may not describe all possible interactions. Give your health care provider a list of all the medicines, herbs, non-prescription drugs, or dietary supplements you use. Also tell them if you smoke, drink alcohol, or use illegal drugs. Some items may interact with your medicine. What should I watch for while using this medicine? Notify your physician immediately if you become short of breath while doing your normal activities. Do not take this medicine within 6 hours of bedtime. It can keep you from getting to sleep. Avoid drinks that contain caffeine and try to stick to a regular bedtime every night. This medicine was intended to be used in addition to a healthy diet and exercise. The best results are achieved this way. This medicine is only indicated for short-term use. Eventually your weight loss may level out. At that point, the drug will only help you maintain your new weight. Do not increase or in any way change your dose without consulting your doctor. You may get drowsy or dizzy. Do not drive, use machinery, or do anything that needs mental alertness until you know how this medicine affects you. Do not stand or sit up quickly, especially if you are an older patient. This reduces the risk of dizzy or fainting spells. Alcohol may increase dizziness and drowsiness. Avoid alcoholic drinks. What side effects may I notice from receiving this medicine? Side effects that you should report to your doctor or health care professional as soon  as possible: -chest pain, palpitations -depression or severe changes in mood -increased blood pressure -irritability -nervousness or restlessness -severe  dizziness -shortness of breath -problems urinating -unusual swelling of the legs -vomiting Side effects that usually do not require medical attention (report to your doctor or health care professional if they continue or are bothersome): -blurred vision or other eye problems -changes in sexual ability or desire -constipation or diarrhea -difficulty sleeping -dry mouth or unpleasant taste -headache -nausea This list may not describe all possible side effects. Call your doctor for medical advice about side effects. You may report side effects to FDA at 1-800-FDA-1088. Where should I keep my medicine? Keep out of the reach of children. This medicine can be abused. Keep your medicine in a safe place to protect it from theft. Do not share this medicine with anyone. Selling or giving away this medicine is dangerous and against the law. Store at room temperature between 20 and 25 degrees C (68 and 77 degrees F). Keep container tightly closed. Throw away any unused medicine after the expiration date. NOTE: This sheet is a summary. It may not cover all possible information. If you have questions about this medicine, talk to your doctor, pharmacist, or health care provider.  2013, Elsevier/Gold Standard. (10/24/2010 11:02:44 AM)  Lorcaserin oral tablets What is this medicine? LORCASERIN (lor ca SER in) is used to promote and maintain weight loss in obese patients. This medicine should be used with a reduced calorie diet and, if appropriate, an exercise program. This medicine may be used for other purposes; ask your health care provider or pharmacist if you have questions. What should I tell my health care provider before I take this medicine? They need to know if you have any of these conditions: -anatomical deformation of the penis, Peyronie's disease, or history of priapism (painful and prolonged erection) -diabetes -heart disease -history of blood diseases, like sickle cell anemia or  leukemia -history of irregular heartbeat -kidney disease -liver disease -suicidal thoughts, plans, or attempt; a previous suicide attempt by you or a family member -an unusual or allergic reaction to lorcaserin, other medicines, foods, dyes, or preservatives -pregnant or trying to get pregnant -breast-feeding How should I use this medicine? Take this medicine by mouth with a glass of water. Follow the directions on the prescription label. You can take it with or without food. Take your medicine at regular intervals. Do not take it more often than directed. Do not stop taking except on your doctor's advice. Talk to your pediatrician regarding the use of this medicine in children. Special care may be needed. Overdosage: If you think you've taken too much of this medicine contact a poison control center or emergency room at once. Overdosage: If you think you have taken too much of this medicine contact a poison control center or emergency room at once. NOTE: This medicine is only for you. Do not share this medicine with others. What if I miss a dose? If you miss a dose, take it as soon as you can. If it is almost time for your next dose, take only that dose. Do not take double or extra doses. What may interact with this medicine? -cabergoline -certain medicines for depression, anxiety, or psychotic disturbances -certain medicines for erectile dysfunction -certain medicines for migraine headache like almotriptan, eletriptan, frovatriptan, naratriptan, rizatriptan, sumatriptan, zolmitriptan -dextromethorphan -linezolid -MAOIs like Carbex, Eldepryl, Marplan, Nardil, and Parnate -medicines for diabetes -orlistat -tramadol -St. John's Wort -stimulant medicines for attention disorders, weight loss, or to  stay awake This list may not describe all possible interactions. Give your health care provider a list of all the medicines, herbs, non-prescription drugs, or dietary supplements you use. Also  tell them if you smoke, drink alcohol, or use illegal drugs. Some items may interact with your medicine. What should I watch for while using this medicine? This medicine is intended to be used in addition to a healthy diet and appropriate exercise. The best results are achieved this way. Do not increase or in any way change your dose without consulting your doctor or health care professional. Your doctor should tell you to stop taking this medicine if you do not lose a certain amount of weight within the first 12 weeks of treatment. Visit your doctor or health care professional for regular checkups. Your doctor may order blood tests or other tests to see how you are doing. Do not drive, use machinery, or do anything that needs mental alertness until you know how this medicine affects you. This medicine may affect blood sugar levels. If you have diabetes, check with your doctor or health care professional before you change your diet or the dose of your diabetic medicine. Patients and their families should watch out for worsening depression or thoughts of suicide. Also watch out for sudden changes in feelings such as feeling anxious, agitated, panicky, irritable, hostile, aggressive, impulsive, severely restless, overly excited and hyperactive, or not being able to sleep. If this happens, especially at the beginning of treatment or after a change in dose, call your health care professional. Contact you doctor or health care professional right away if the erection lasts longer than 4 hours or if it becomes painful. This may be a sign of serious problem and must be treated right away to prevent permanent damage. What side effects may I notice from receiving this medicine? Side effects that you should report to your doctor or health care professional as soon as possible: -allergic reactions like skin rash, itching or hives, swelling of the face, lips, or tongue -abnormal production of milk -breast enlargement  in both males and females -breathing problems -changes in emotions or moods -changes in vision -confusion -erection lasting more than 4 hours -fast or irregular heart beat -feeling faint or lightheaded, falls -fever or chills, sore throat -hallucination, loss of contact with reality -high or low blood pressure -menstrual changes -restlessness -seizures -slow or irregular heartbeat -stiff muscles -sweating -suicidal thoughts or other mood changes -tremors -trouble walking -unusually weak or tired -vomiting  Side effects that usually do not require medical attention (Report these to your doctor or health care professional if they continue or are bothersome.): -back pain -constipation -cough -dizziness -dry mouth -low blood sugar if you have diabetes (ask your doctor or healthcare professional for a list of these symptoms) -nausea -tiredness This list may not describe all possible side effects. Call your doctor for medical advice about side effects. You may report side effects to FDA at 1-800-FDA-1088. Where should I keep my medicine? Keep out of the reach of children. Store at room temperature between 15 and 30 degrees C (59 and 86 degrees F). Throw away any unused medicine after the expiration date. NOTE: This sheet is a summary. It may not cover all possible information. If you have questions about this medicine, talk to your doctor, pharmacist, or health care provider.  2013, [employer]vier/Gold Standard. (03/26/2011 5:15:17 PM)  Exercise to Lose Weight Exercise and a healthy diet may help you lose weight. Your doctor may suggest  specific exercises. EXERCISE IDEAS AND TIPS  Choose low-cost things you enjoy doing, such as walking, bicycling, or exercising to workout videos.  Take stairs instead of the elevator.  Walk during your lunch break.  Park your car further away from work or school.  Go to a gym or an exercise class.  Start with 5 to 10 minutes of exercise each  day. Build up to 30 minutes of exercise 4 to 6 days a week.  Wear shoes with good support and comfortable clothes.  Stretch before and after working out.  Work out until you breathe harder and your heart beats faster.  Drink extra water when you exercise.  Do not do so much that you hurt yourself, feel dizzy, or get very short of breath. Exercises that burn about 150 calories:  Running 1  miles in 15 minutes.  Playing volleyball for 45 to 60 minutes.  Washing and waxing a car for 45 to 60 minutes.  Playing touch football for 45 minutes.  Walking 1  miles in 35 minutes.  Pushing a stroller 1  miles in 30 minutes.  Playing basketball for 30 minutes.  Raking leaves for 30 minutes.  Bicycling 5 miles in 30 minutes.  Walking 2 miles in 30 minutes.  Dancing for 30 minutes.  Shoveling snow for 15 minutes.  Swimming laps for 20 minutes.  Walking up stairs for 15 minutes.  Bicycling 4 miles in 15 minutes.  Gardening for 30 to 45 minutes.  Jumping rope for 15 minutes.  Washing windows or floors for 45 to 60 minutes. Document Released: 10/12/2010 Document Revised: 12/02/2011 Document Reviewed: 10/12/2010 [facility] Patient Information 2014 [place], [place].

## 2013-06-02 LAB — CMP14+EGFR
Albumin: 4.3 g/dL (ref 3.5–5.5)
BUN: 10 mg/dL (ref 6–24)
CO2: 27 mmol/L (ref 18–29)
Calcium: 9.8 mg/dL (ref 8.7–10.2)
Chloride: 100 mmol/L (ref 97–108)
Glucose: 101 mg/dL — ABNORMAL HIGH (ref 65–99)
Total Protein: 7.1 g/dL (ref 6.0–8.5)

## 2013-06-07 ENCOUNTER — Ambulatory Visit (INDEPENDENT_AMBULATORY_CARE_PROVIDER_SITE_OTHER): Payer: Medicaid Other | Admitting: Pharmacist

## 2013-06-07 DIAGNOSIS — R635 Abnormal weight gain: Secondary | ICD-10-CM

## 2013-06-07 MED ORDER — PHENTERMINE HCL 37.5 MG PO TABS: 37.5000 mg | ORAL_TABLET | Freq: Every day | ORAL | Status: DC

## 2013-06-07 NOTE — Progress Notes (Signed)
Subjective:     Amy Cardenas is a 42 y.o. female here for discussion regarding weight loss.  She gave birth to her second child in January 2014 and has lost only 10 pounds since then.  She also has a 15yo son.  Weight prior to pregnancy was 250 pounds.  She has noted a weight gain of approximately 80 pounds over the last 2 years. She feels ideal weight is 180 pounds. Weight at graduation from high school was 135 pounds. History of eating disorders: none. There is a family history positive for obesity in the patient and mother. Previous treatments for obesity include prescription appetite suppressants: phentermine , self-directed dieting and supervised diet program. Obesity associated medical conditions: amenorrhea. Obesity associated medications: none. Cardiovascular risk factors besides obesity: obesity (BMI >= 30 kg/m2) and sedentary lifestyle.  The following portions of the patient's history were reviewed and updated as appropriate: allergies, current medications, past family history, past medical history, past social history, past surgical history and problem list.   Objective:    Body mass index is 49.55 kg/(m^2). Filed Weights   06/07/13 1315  Weight: 271 lb (122.925 kg)   Filed Vitals:   06/07/13 1315  BP: 140/80  Pulse: 74     Assessment:    Obesity. I assessed Amy to be in an action stage with respect to weight loss.    Plan:    General weight loss/lifestyle modification strategies discussed (elicit support from others; identify saboteurs; non-food rewards, etc). Behavioral treatment: use smaller plate for meals, stress management. Diet interventions: moderate (500 kCal/d) deficit diet and low carbyhydrate/increase fruits and non stachy vegetable/ lean proteins. Informal exercise measures discussed, e.g. taking stairs instead of elevator. Regular aerobic exercise program discussed. Medication: phentermine. 37.5mg  1/2 to 1 tablet daily each morning. RTC in 3 weeks to recheck  weight, BP and HR.  Time spent with patient = 40 minutes  Henrene Pastor, PharmD, CPP

## 2013-06-28 ENCOUNTER — Ambulatory Visit: Payer: Self-pay

## 2013-07-29 ENCOUNTER — Other Ambulatory Visit: Payer: Self-pay

## 2013-08-24 ENCOUNTER — Encounter: Payer: Self-pay | Admitting: Family Medicine

## 2013-08-24 ENCOUNTER — Ambulatory Visit (INDEPENDENT_AMBULATORY_CARE_PROVIDER_SITE_OTHER): Payer: Medicaid Other | Admitting: Family Medicine

## 2013-08-24 VITALS — BP 106/70 | HR 99 | Temp 98.9°F | Ht 62.0 in | Wt 262.0 lb

## 2013-08-24 DIAGNOSIS — J039 Acute tonsillitis, unspecified: Secondary | ICD-10-CM

## 2013-08-24 DIAGNOSIS — J029 Acute pharyngitis, unspecified: Secondary | ICD-10-CM

## 2013-08-24 LAB — POCT RAPID STREP A (OFFICE): Rapid Strep A Screen: POSITIVE — AB

## 2013-08-24 MED ORDER — METHYLPREDNISOLONE SODIUM SUCC 125 MG IJ SOLR
125.0000 mg | Freq: Once | INTRAMUSCULAR | Status: AC
  Administered 2013-08-24: 125 mg via INTRAMUSCULAR

## 2013-08-24 MED ORDER — CLINDAMYCIN HCL 300 MG PO CAPS: 300.0000 mg | ORAL_CAPSULE | Freq: Three times a day (TID) | ORAL | Status: DC

## 2013-08-24 MED ORDER — SODIUM CHLORIDE 0.9 % IV SOLN: 125.0000 mg | Freq: Once | INTRAVENOUS | Status: DC

## 2013-08-24 NOTE — Patient Instructions (Addendum)
Strep Throat  Strep throat is an infection of the throat caused by a bacteria named Streptococcus pyogenes. Your caregiver may call the infection streptococcal "tonsillitis" or "pharyngitis" depending on whether there are signs of inflammation in the tonsils or back of the throat. Strep throat is most common in children aged 42 15 years during the cold months of the year, but it can occur in people of any age during any season. This infection is spread from person to person (contagious) through coughing, sneezing, or other close contact.  SYMPTOMS   · Fever or chills.  · Painful, swollen, red tonsils or throat.  · Pain or difficulty when swallowing.  · White or yellow spots on the tonsils or throat.  · Swollen, tender lymph nodes or "glands" of the neck or under the jaw.  · Red rash all over the body (rare).  DIAGNOSIS   Many different infections can cause the same symptoms. A test must be done to confirm the diagnosis so the right treatment can be given. A "rapid strep test" can help your caregiver make the diagnosis in a few minutes. If this test is not available, a light swab of the infected area can be used for a throat culture test. If a throat culture test is done, results are usually available in a day or two.  TREATMENT   Strep throat is treated with antibiotic medicine.  HOME CARE INSTRUCTIONS   · Gargle with 1 tsp of salt in 1 cup of warm water, 3 4 times per day or as needed for comfort.  · Family members who also have a sore throat or fever should be tested for strep throat and treated with antibiotics if they have the strep infection.  · Make sure everyone in your household washes their hands well.  · Do not share food, drinking cups, or personal items that could cause the infection to spread to others.  · You may need to eat a soft food diet until your sore throat gets better.  · Drink enough water and fluids to keep your urine clear or pale yellow. This will help prevent dehydration.  · Get plenty of  rest.  · Stay home from school, daycare, or work until you have been on antibiotics for 24 hours.  · Only take over-the-counter or prescription medicines for pain, discomfort, or fever as directed by your caregiver.  · If antibiotics are prescribed, take them as directed. Finish them even if you start to feel better.  SEEK MEDICAL CARE IF:   · The glands in your neck continue to enlarge.  · You develop a rash, cough, or earache.  · You cough up green, yellow-brown, or bloody sputum.  · You have pain or discomfort not controlled by medicines.  · Your problems seem to be getting worse rather than better.  SEEK IMMEDIATE MEDICAL CARE IF:   · You develop any new symptoms such as vomiting, severe headache, stiff or painful neck, chest pain, shortness of breath, or trouble swallowing.  · You develop severe throat pain, drooling, or changes in your voice.  · You develop swelling of the neck, or the skin on the neck becomes red and tender.  · You have a fever.  · You develop signs of dehydration, such as fatigue, dry mouth, and decreased urination.  · You become increasingly sleepy, or you cannot wake up completely.  Document Released: 09/06/2000 Document Revised: 08/26/2012 Document Reviewed: 11/08/2010  [employer]® Patient Information ©2014 [employer].

## 2013-08-24 NOTE — Progress Notes (Signed)
   Subjective:    Patient ID: Amy Cardenas, female    DOB: December 03, 1970, 42 y.o.   MRN: [id number]  HPI SORE THROAT  Onset: 2-3 dys  Description: sore throat, fever, trouble swallowing, cough  Modifying factors: none  Symptoms  Fever:  tmax 100 URI symptoms: mild Cough: yes Headache: no Rash:  no Swollen glands:   yes Recent Strep Exposure: unsure LUQ pain: no Heartburn/brash: no Allergy Symptoms: no  Red Flags STD exposure: no Breathing difficulty: minimal  Drooling: no Trismus: no     Review of Systems  All other systems reviewed and are negative.       Objective:   Physical Exam  Constitutional:  Morbidly obese    HENT:  Head: Normocephalic and atraumatic.  Right Ear: External ear normal.  Left Ear: External ear normal.  Mouth/Throat: Oropharyngeal exudate present.  +nasal erythema, rhinorrhea bilaterally, + post oropharyngeal erythema  Marked tonsillar hypertrophy + tonsillar exudate   Eyes: Conjunctivae are normal. Pupils are equal, round, and reactive to light.  Neck: Normal range of motion.  Cardiovascular: Normal rate and regular rhythm.   Pulmonary/Chest: Effort normal and breath sounds normal.  Abdominal: Soft.  Musculoskeletal: Normal range of motion.  Neurological: She is alert.  Skin: Skin is warm.          Assessment & Plan:  Sore throat - Plan: POCT rapid strep A, CANCELED: Strep A culture, throat  Acute tonsillitis - Plan: methylPREDNISolone sodium succinate (SOLU-MEDROL) 130 mg in sodium chloride 0.9 % 50 mL IVPB, clindamycin (CLEOCIN) 300 MG capsule  Will treat with clindamycin given PCN allergy  Solumedrol 125mg  IM x1 given tonsillar hypertrophy No clinical signs of airway compromise currently Discussed infectious and ENT red flags at length Follow up as needed.

## 2013-08-24 NOTE — Addendum Note (Signed)
Addended by: Gwenith Daily on: 08/24/2013 04:12 PM   Modules accepted: Orders

## 2013-08-25 LAB — CBC WITH DIFFERENTIAL
Basophils Absolute: 0 10*3/uL (ref 0.0–0.2)
Eos: 2 %
Eosinophils Absolute: 0.2 10*3/uL (ref 0.0–0.4)
HCT: 32.2 % — ABNORMAL LOW (ref 34.0–46.6)
Hemoglobin: 10.2 g/dL — ABNORMAL LOW (ref 11.1–15.9)
Lymphocytes Absolute: 2.1 10*3/uL (ref 0.7–3.1)
MCHC: 31.7 g/dL (ref 31.5–35.7)
MCV: 69 fL — ABNORMAL LOW (ref 79–97)
Monocytes: 7 %
Neutrophils Absolute: 9 10*3/uL — ABNORMAL HIGH (ref 1.4–7.0)
RDW: 16.6 % — ABNORMAL HIGH (ref 12.3–15.4)
WBC: 12.2 10*3/uL — ABNORMAL HIGH (ref 3.4–10.8)

## 2013-08-26 LAB — EPSTEIN-BARR VIRUS VCA ANTIBODY PANEL
EBV AB VCA, IGG: >600 U/mL — ABNORMAL HIGH (ref 0.0–17.9)
EBV Early Antigen Ab, IgG: 134 U/mL — ABNORMAL HIGH (ref 0.0–8.9)

## 2014-07-25 ENCOUNTER — Encounter: Payer: Self-pay | Admitting: Family Medicine

## 2014-10-11 ENCOUNTER — Emergency Department (HOSPITAL_COMMUNITY)
Admission: EM | Admit: 2014-10-11 | Discharge: 2014-10-11 | Disposition: A | Discharge status: Home / Self Care | Payer: BLUE CROSS/BLUE SHIELD | Attending: Emergency Medicine | Admitting: Emergency Medicine

## 2014-10-11 ENCOUNTER — Emergency Department (HOSPITAL_COMMUNITY): Payer: BLUE CROSS/BLUE SHIELD

## 2014-10-11 ENCOUNTER — Encounter (HOSPITAL_COMMUNITY): Payer: Self-pay | Admitting: *Deleted

## 2014-10-11 DIAGNOSIS — R0789 Other chest pain: Secondary | ICD-10-CM | POA: Diagnosis not present

## 2014-10-11 DIAGNOSIS — E669 Obesity, unspecified: Secondary | ICD-10-CM | POA: Insufficient documentation

## 2014-10-11 DIAGNOSIS — Z8659 Personal history of other mental and behavioral disorders: Secondary | ICD-10-CM | POA: Insufficient documentation

## 2014-10-11 DIAGNOSIS — Z880 Allergy status to penicillin: Secondary | ICD-10-CM | POA: Diagnosis not present

## 2014-10-11 DIAGNOSIS — Z862 Personal history of diseases of the blood and blood-forming organs and certain disorders involving the immune mechanism: Secondary | ICD-10-CM | POA: Insufficient documentation

## 2014-10-11 DIAGNOSIS — R079 Chest pain, unspecified: Secondary | ICD-10-CM | POA: Diagnosis present

## 2014-10-11 DIAGNOSIS — I10 Essential (primary) hypertension: Secondary | ICD-10-CM | POA: Diagnosis not present

## 2014-10-11 DIAGNOSIS — Z87891 Personal history of nicotine dependence: Secondary | ICD-10-CM | POA: Diagnosis not present

## 2014-10-11 DIAGNOSIS — Z792 Long term (current) use of antibiotics: Secondary | ICD-10-CM | POA: Diagnosis not present

## 2014-10-11 HISTORY — DX: Essential (primary) hypertension: I10

## 2014-10-11 LAB — BASIC METABOLIC PANEL
ANION GAP: 7 (ref 5–15)
BUN: 14 mg/dL (ref 6–23)
CO2: 28 mmol/L (ref 19–32)
Calcium: 9 mg/dL (ref 8.4–10.5)
Chloride: 103 mEq/L (ref 96–112)
Creatinine, Ser: 0.88 mg/dL (ref 0.50–1.10)
GFR calc Af Amer: >90 mL/min (ref >90)
GFR calc non Af Amer: 79 mL/min — ABNORMAL LOW (ref >90)
GLUCOSE: 125 mg/dL — AB (ref 70–99)
POTASSIUM: 3.8 mmol/L (ref 3.5–5.1)
SODIUM: 138 mmol/L (ref 135–145)

## 2014-10-11 LAB — CBC
HEMATOCRIT: 33.3 % — AB (ref 36.0–46.0)
HEMOGLOBIN: 10 g/dL — AB (ref 12.0–15.0)
MCH: 21.2 pg — ABNORMAL LOW (ref 26.0–34.0)
MCHC: 30 g/dL (ref 30.0–36.0)
MCV: 70.6 fL — ABNORMAL LOW (ref 78.0–100.0)
Platelets: 415 10*3/uL — ABNORMAL HIGH (ref 150–400)
RBC: 4.72 MIL/uL (ref 3.87–5.11)
RDW: 16.9 % — AB (ref 11.5–15.5)
WBC: 7.3 10*3/uL (ref 4.0–10.5)

## 2014-10-11 LAB — TROPONIN I: Troponin I: <0.03 ng/mL (ref <0.031)

## 2014-10-11 MED ORDER — MORPHINE SULFATE 4 MG/ML IJ SOLN
4.0000 mg | Freq: Once | INTRAMUSCULAR | Status: AC
Start: 2014-10-11 — End: 2014-10-11
  Administered 2014-10-11: 4 mg via INTRAVENOUS
  Filled 2014-10-11: qty 1

## 2014-10-11 MED ORDER — ASPIRIN 325 MG PO TABS
325.0000 mg | ORAL_TABLET | ORAL | Status: AC
  Administered 2014-10-11: 325 mg via ORAL
  Filled 2014-10-11: qty 1

## 2014-10-11 MED ORDER — ONDANSETRON HCL 4 MG/2ML IJ SOLN
4.0000 mg | Freq: Once | INTRAMUSCULAR | Status: AC
  Administered 2014-10-11: 4 mg via INTRAVENOUS
  Filled 2014-10-11: qty 2

## 2014-10-11 MED ORDER — NITROGLYCERIN 0.4 MG SL SUBL
0.4000 mg | SUBLINGUAL_TABLET | SUBLINGUAL | Status: DC | PRN
  Administered 2014-10-11: 0.4 mg via SUBLINGUAL
  Filled 2014-10-11 (×4): qty 1

## 2014-10-11 NOTE — ED Provider Notes (Signed)
CSN: [id number]     Arrival date & time 10/11/14  0502 History   First MD Initiated Contact with Patient 10/11/14 0557     Chief Complaint  Patient presents with  . Chest Pain     (Consider location/radiation/quality/duration/timing/severity/associated sxs/prior Treatment) HPI Comments: Patient awoke around 3:30 AM with left-sided chest pain and arm pain. She endorses constant pain in her left upper arm since about 3:30 that feels like a dull ache. It radiates to her left chest intermittently lasting for 2 or 3 minutes at a time. Associated with shortness of breath. Denies nausea or diaphoresis. She has had pain like this in the past but never this intense and has never had it evaluated. Denies any cardiac history. She's never had a stress test. She is a former smoker. Denies any other medical history. Is not take any medications. The pain is worse with lying down and better with sitting up. It is not worse with palpation.  The history is provided by the patient.    Past Medical History  Diagnosis Date  . Obesity   . Anemia   . Anxiety     no meds  . Hypertension    Past Surgical History  Procedure Laterality Date  . Cesarean section    . Wisdom tooth extraction  1990  . Cesarean section with bilateral tubal ligation  10/15/2012    Procedure: CESAREAN SECTION WITH BILATERAL TUBAL LIGATION;  Surgeon: Allyn Kenner, DO;  Location: [facility];  Service: Obstetrics;  Laterality: N/A;  Repeat   Family History  Problem Relation Age of Onset  . Diabetes Father   . Colon cancer Maternal Grandmother    History  Substance Use Topics  . Smoking status: Former Smoker -- 0.15 packs/day for 3 years    Types: Cigarettes    Quit date: 09/23/2010  . Smokeless tobacco: Never Used  . Alcohol Use: No   OB History    Gravida Para Term Preterm AB TAB SAB Ectopic Multiple Living   2 2 2       2      Review of Systems  Constitutional: Negative for activity change and appetite change.  HENT:  Negative for congestion and rhinorrhea.   Eyes: Negative for visual disturbance.  Respiratory: Positive for chest tightness and shortness of breath. Negative for cough.   Cardiovascular: Positive for chest pain.  Gastrointestinal: Positive for nausea. Negative for vomiting and abdominal pain.  Genitourinary: Negative for dysuria, hematuria, vaginal bleeding and vaginal discharge.  Musculoskeletal: Negative for myalgias and arthralgias.  Skin: Negative for rash.  Neurological: Negative for dizziness, weakness and headaches.  A complete 10 system review of systems was obtained and all systems are negative except as noted in the HPI and PMH.      Allergies  Penicillins  Home Medications   Prior to Admission medications   Medication Sig Start Date End Date Taking? Authorizing Provider  clindamycin (CLEOCIN) 300 MG capsule Take 1 capsule (300 mg total) by mouth 3 (three) times daily. 08/24/13   Shanda Howells, MD  phentermine (ADIPEX-P) 37.5 MG tablet Take 1 tablet (37.5 mg total) by mouth daily before breakfast. 1/2 to 1 tablet by mouth each morning. 06/07/13   Chipper Herb, MD   BP 134/80 mmHg  Pulse 73  Temp(Src) 98.5 F (36.9 C) (Oral)  Resp 14  Ht 5\' 1"  (1.549 m)  Wt 260 lb (117.935 kg)  BMI 49.15 kg/m2  SpO2 97%  LMP 09/20/2014 Physical Exam  Constitutional: She is  oriented to person, place, and time. She appears well-developed and well-nourished. No distress.  HENT:  Head: Normocephalic and atraumatic.  Mouth/Throat: Oropharynx is clear and moist. No oropharyngeal exudate.  Eyes: Conjunctivae and EOM are normal. Pupils are equal, round, and reactive to light.  Neck: Normal range of motion. Neck supple.  No meningismus.  Cardiovascular: Normal rate, regular rhythm, normal heart sounds and intact distal pulses.   No murmur heard. Pulmonary/Chest: Effort normal and breath sounds normal. No respiratory distress. She exhibits no tenderness.  L chest is nontender  Abdominal:  Soft. There is no tenderness. There is no rebound and no guarding.  Musculoskeletal: Normal range of motion. She exhibits no edema or tenderness.  Neurological: She is alert and oriented to person, place, and time. No cranial nerve deficit. She exhibits normal muscle tone. Coordination normal.  No ataxia on finger to nose bilaterally. No pronator drift. 5/5 strength throughout. CN 2-12 intact. Negative Romberg. Equal grip strength. Sensation intact. Gait is normal.   Skin: Skin is warm.  Psychiatric: She has a normal mood and affect. Her behavior is normal.  Nursing note and vitals reviewed.   ED Course  Procedures (including critical care time) Labs Review Labs Reviewed  CBC - Abnormal; Notable for the following:    Hemoglobin 10.0 (*)    HCT 33.3 (*)    MCV 70.6 (*)    MCH 21.2 (*)    RDW 16.9 (*)    Platelets 415 (*)    All other components within normal limits  BASIC METABOLIC PANEL - Abnormal; Notable for the following:    Glucose, Bld 125 (*)    GFR calc non Af Amer 79 (*)    All other components within normal limits  TROPONIN I  TROPONIN I    Imaging Review Dg Chest Port 1 View  10/11/2014   CLINICAL DATA:  Chest pain and shortness of breath  EXAM: PORTABLE CHEST - 1 VIEW  COMPARISON:  None.  FINDINGS: Prominent heart size but no cardiomegaly when accounting for technique. Negative aortic and hilar contours. No acute infiltrate or edema. No effusion or pneumothorax. No acute osseous findings.  IMPRESSION: Negative chest.   Electronically Signed   By: Jorje Guild M.D.   On: 10/11/2014 06:05     EKG Interpretation   Date/Time:  Tuesday October 11 2014 07:21:55 EST Ventricular Rate:  77 PR Interval:  184 QRS Duration: 107 QT Interval:  403 QTC Calculation: 456 R Axis:   59 Text Interpretation:  Sinus rhythm Abnormal inferior Q waves Borderline T  wave abnormalities No significant change was found Confirmed by Wyvonnia Dusky   MD, Toyoko Silos ([id number]) on 10/11/2014 7:25:39  AM      MDM   Final diagnoses:  Atypical chest pain   Constant left arm pain since 3:30 with intermittent left-sided chest pain lasting a few minutes at a time. EKG shows nonspecific T-wave changes inferiorly. No comparison.  Pain seems atypical for ACS. Aspirin given. We'll check labs and x-ray.  Xray negative.  Troponin negative.  No tachycardia or hypoxia. Stable anemia. Pain resolved with morphine.  Low suspicion for ACS or PE. Heart score 2. Repeat EKG unchanged.  Second troponin pending at time of sign out to Dr. Wilson Singer.  D/w patient need to establish care with PCP as well as cardiology for outpatient stress test.      Ezequiel Essex, MD 10/11/14 0830

## 2014-10-11 NOTE — Discharge Instructions (Signed)
Chest Pain (Nonspecific) There is no evidence of heart attack or blood clot in your lung.  Follow up with your doctor or the cardiologist for a stress test. Return to the ED if you develop new or worsening symptoms. It is often hard to give a specific diagnosis for the cause of chest pain. There is always a chance that your pain could be related to something serious, such as a heart attack or a blood clot in the lungs. You need to follow up with your health care provider for further evaluation. CAUSES   Heartburn.  Pneumonia or bronchitis.  Anxiety or stress.  Inflammation around your heart (pericarditis) or lung (pleuritis or pleurisy).  A blood clot in the lung.  A collapsed lung (pneumothorax). It can develop suddenly on its own (spontaneous pneumothorax) or from trauma to the chest.  Shingles infection (herpes zoster virus). The chest wall is composed of bones, muscles, and cartilage. Any of these can be the source of the pain.  The bones can be bruised by injury.  The muscles or cartilage can be strained by coughing or overwork.  The cartilage can be affected by inflammation and become sore (costochondritis). DIAGNOSIS  Lab tests or other studies may be needed to find the cause of your pain. Your health care provider may have you take a test called an ambulatory electrocardiogram (ECG). An ECG records your heartbeat patterns over a 24-hour period. You may also have other tests, such as:  Transthoracic echocardiogram (TTE). During echocardiography, sound waves are used to evaluate how blood flows through your heart.  Transesophageal echocardiogram (TEE).  Cardiac monitoring. This allows your health care provider to monitor your heart rate and rhythm in real time.  Holter monitor. This is a portable device that records your heartbeat and can help diagnose heart arrhythmias. It allows your health care provider to track your heart activity for several days, if needed.  Stress  tests by exercise or by giving medicine that makes the heart beat faster. TREATMENT   Treatment depends on what may be causing your chest pain. Treatment may include:  Acid blockers for heartburn.  Anti-inflammatory medicine.  Pain medicine for inflammatory conditions.  Antibiotics if an infection is present.  You may be advised to change lifestyle habits. This includes stopping smoking and avoiding alcohol, caffeine, and chocolate.  You may be advised to keep your head raised (elevated) when sleeping. This reduces the chance of acid going backward from your stomach into your esophagus. Most of the time, nonspecific chest pain will improve within 2-3 days with rest and mild pain medicine.  HOME CARE INSTRUCTIONS   If antibiotics were prescribed, take them as directed. Finish them even if you start to feel better.  For the next few days, avoid physical activities that bring on chest pain. Continue physical activities as directed.  Do not use any tobacco products, including cigarettes, chewing tobacco, or electronic cigarettes.  Avoid drinking alcohol.  Only take medicine as directed by your health care provider.  Follow your health care provider's suggestions for further testing if your chest pain does not go away.  Keep any follow-up appointments you made. If you do not go to an appointment, you could develop lasting (chronic) problems with pain. If there is any problem keeping an appointment, call to reschedule. SEEK MEDICAL CARE IF:   Your chest pain does not go away, even after treatment.  You have a rash with blisters on your chest.  You have a fever. SEEK IMMEDIATE  MEDICAL CARE IF:   You have increased chest pain or pain that spreads to your arm, neck, jaw, back, or abdomen.  You have shortness of breath.  You have an increasing cough, or you cough up blood.  You have severe back or abdominal pain.  You feel nauseous or vomit.  You have severe weakness.  You  faint.  You have chills. This is an emergency. Do not wait to see if the pain will go away. Get medical help at once. Call your local emergency services (911 in [place] Do not drive yourself to the hospital. MAKE SURE YOU:   Understand these instructions.  Will watch your condition.  Will get help right away if you are not doing well or get worse. Document Released: 06/19/2005 Document Revised: 09/14/2013 Document Reviewed: 04/14/2008 [facility] Patient Information 2015 [place], [place]. This information is not intended to replace advice given to you by your health care provider. Make sure you discuss any questions you have with your health care provider.

## 2014-10-11 NOTE — ED Notes (Signed)
Pt reporting waking up with chest pain on left side this morning.  Reporting tingling feeling in left arm.  Reports associated SOB, denies nausea.  Reports pain is "dull ache" in nature.

## 2014-10-11 NOTE — ED Notes (Signed)
Pt reporting minimal relief from nitro.  Declines second and third doses.

## 2014-10-25 ENCOUNTER — Encounter: Payer: Self-pay | Admitting: Cardiology

## 2014-10-25 ENCOUNTER — Encounter: Payer: Self-pay | Admitting: *Deleted

## 2014-10-25 ENCOUNTER — Ambulatory Visit (INDEPENDENT_AMBULATORY_CARE_PROVIDER_SITE_OTHER): Payer: BLUE CROSS/BLUE SHIELD | Admitting: Cardiology

## 2014-10-25 VITALS — BP 133/84 | HR 94 | Ht 62.0 in | Wt 281.0 lb

## 2014-10-25 DIAGNOSIS — R011 Cardiac murmur, unspecified: Secondary | ICD-10-CM

## 2014-10-25 DIAGNOSIS — I10 Essential (primary) hypertension: Secondary | ICD-10-CM

## 2014-10-25 DIAGNOSIS — R0602 Shortness of breath: Secondary | ICD-10-CM

## 2014-10-25 DIAGNOSIS — R0789 Other chest pain: Secondary | ICD-10-CM

## 2014-10-25 NOTE — Progress Notes (Signed)
Reason for visit: Chest pain  Clinical Summary [name] is a 44 y.o.female referred for cardiology consultation by Dr. Wilson Singer after recent ER evaluation at [facility]. I reviewed the records. She presented for evaluation of chest discomfort that followed left arm aching. This began in the early morning hours after she got up for her usual work shift. She states that she has left arm discomfort intermittently, although that it tends to be sporadic, not precipitated by exertion necessarily. She does describe herself as being chronically short of breath, sometimes worse when she gets emotionally upset. She has not undergone any prior cardiac risk stratification.  She has previously followed with [facility]. She does not have any regular primary care follow-up however.  Labwork from January of this year showed hemoglobin 10.0, MCV 70, platelets 415, potassium 3.8, BUN 14, creatinine 0.8, troponin I negative. Her random glucose was 125, she does not have a known history of diabetes mellitus. Chest x-ray from January was reported as normal. ECG from January showed sinus rhythm with nonspecific ST-T wave changes.  Also reports a long-standing history of heart murmur, no echocardiogram assessment.   Allergies  Allergen Reactions  . Penicillins Hives and Swelling  . Sulfa Antibiotics Rash    Current Outpatient Prescriptions  Medication Sig Dispense Refill  . HYDROcodone-homatropine (HYCODAN) 5-1.5 MG/5ML syrup Take 5 mLs by mouth every 6 (six) hours as needed for cough.     No current facility-administered medications for this visit.    Past Medical History  Diagnosis Date  . Obesity   . Anemia   . Anxiety   . Essential hypertension     Past Surgical History  Procedure Laterality Date  . Cesarean section    . Wisdom tooth extraction  1990  . Cesarean section with bilateral tubal ligation  10/15/2012    Procedure: CESAREAN SECTION WITH BILATERAL TUBAL LIGATION;   Surgeon: Allyn Kenner, DO;  Location: [facility];  Service: Obstetrics;  Laterality: N/A;  Repeat    Family History  Problem Relation Age of Onset  . Diabetes Father   . Colon cancer Maternal Grandmother     Social History Amy Cardenas reports that she quit smoking about 6 years ago. Her smoking use included Cigarettes. She started smoking about 7 years ago. She has a .45 pack-year smoking history. She has never used smokeless tobacco. Amy Cardenas reports that she does not drink alcohol.  Review of Systems Complete review of systems negative except as otherwise outlined in the clinical summary and also the following. No palpitations or syncope. No claudication. No orthopnea or PND.  Physical Examination Filed Vitals:   10/25/14 1436  BP: 133/84  Pulse: 94    Wt Readings from Last 3 Encounters:  10/25/14 281 lb (127.461 kg)  10/11/14 260 lb (117.935 kg)  08/24/13 262 lb (118.842 kg)   Morbidly obese woman, no distress. HEENT: Conjunctiva and lids normal, oropharynx clear. Neck: Supple, increased girth without obvious elevated JVP or carotid bruits, no thyromegaly. Lungs: Clear to auscultation, nonlabored breathing at rest. Cardiac: Regular rate and rhythm, no S3, 2/6 systolic murmur, no pericardial rub. Abdomen: Soft, nontender, obese, bowel sounds present, no guarding or rebound. Extremities: No pitting edema, distal pulses 2+. Skin: Warm and dry. Musculoskeletal: No kyphosis. Neuropsychiatric: Alert and oriented x3, affect grossly appropriate.   Problem List and Plan   Atypical chest pain Recent ER evaluation noted. Cardiac markers were negative at that time, and ECG was abnormal but overall nonspecific. She  has a history of hypertension, also morbid obesity, question developing diabetes mellitus with random glucose 125. We will plan further cardiac risk stratification with [facility]XT. I have otherwise recommended that she reestablish with her previous primary care provider for more  routine health maintenance.   Cardiac murmur Reportedly long-standing. Echocardiogram will be obtained for objective evaluation.   Essential hypertension She is not on any standing medications at this time. We discussed weight loss, salt restriction, also recommended reestablishing follow-up with primary care.     Satira Sark, M.D., F.A.C.C.

## 2014-10-25 NOTE — Assessment & Plan Note (Signed)
She is not on any standing medications at this time. We discussed weight loss, salt restriction, also recommended reestablishing follow-up with primary care.

## 2014-10-25 NOTE — Assessment & Plan Note (Signed)
Reportedly long-standing. Echocardiogram will be obtained for objective evaluation.

## 2014-10-25 NOTE — Patient Instructions (Signed)
Your physician recommends that you schedule a follow-up appointment in: After you ECHO and Stress test.   Your physician recommends that you continue on your current medications as directed. Please refer to the Current Medication list given to you today.  Your physician has requested that you have an exercise tolerance test. For further information please visit [email or web address]. Please also follow instruction sheet, as given.  Your physician has requested that you have an echocardiogram. Echocardiography is a painless test that uses sound waves to create images of your heart. It provides your doctor with information about the size and shape of your heart and how well your heart's chambers and valves are working. This procedure takes approximately one hour. There are no restrictions for this procedure.  Thank you for choosing [facility]!

## 2014-10-25 NOTE — Assessment & Plan Note (Signed)
Recent ER evaluation noted. Cardiac markers were negative at that time, and ECG was abnormal but overall nonspecific. She has a history of hypertension, also morbid obesity, question developing diabetes mellitus with random glucose 125. We will plan further cardiac risk stratification with GXT. I have otherwise recommended that she reestablish with her previous primary care provider for more routine health maintenance.

## 2014-11-07 ENCOUNTER — Inpatient Hospital Stay (HOSPITAL_COMMUNITY): Admission: RE | Admit: 2014-11-07 | Payer: BLUE CROSS/BLUE SHIELD | Source: Ambulatory Visit

## 2014-11-07 ENCOUNTER — Other Ambulatory Visit (HOSPITAL_COMMUNITY): Payer: BLUE CROSS/BLUE SHIELD

## 2016-08-08 ENCOUNTER — Other Ambulatory Visit: Payer: Self-pay | Admitting: Obstetrics and Gynecology

## 2016-08-08 DIAGNOSIS — Z1231 Encounter for screening mammogram for malignant neoplasm of breast: Secondary | ICD-10-CM

## 2016-08-09 ENCOUNTER — Encounter: Payer: Self-pay | Admitting: Family

## 2016-08-09 ENCOUNTER — Ambulatory Visit (INDEPENDENT_AMBULATORY_CARE_PROVIDER_SITE_OTHER): Payer: Medicaid Other | Admitting: Family

## 2016-08-09 VITALS — BP 131/87 | HR 90 | Temp 97.9°F | Ht 62.0 in | Wt 294.2 lb

## 2016-08-09 DIAGNOSIS — R5383 Other fatigue: Secondary | ICD-10-CM

## 2016-08-09 DIAGNOSIS — I10 Essential (primary) hypertension: Secondary | ICD-10-CM

## 2016-08-09 NOTE — Patient Instructions (Signed)
Hypertension Hypertension, commonly called high blood pressure, is when the force of blood pumping through your arteries is too strong. Your arteries are the blood vessels that carry blood from your heart throughout your body. A blood pressure reading consists of a higher number over a lower number, such as 110/72. The higher number (systolic) is the pressure inside your arteries when your heart pumps. The lower number (diastolic) is the pressure inside your arteries when your heart relaxes. Ideally you want your blood pressure below 120/80. Hypertension forces your heart to work harder to pump blood. Your arteries may become narrow or stiff. Having untreated or uncontrolled hypertension can cause heart attack, stroke, kidney disease, and other problems. What increases the risk? Some risk factors for high blood pressure are controllable. Others are not. Risk factors you cannot control include:  Race. You may be at higher risk if you are African American.  Age. Risk increases with age.  Gender. Men are at higher risk than women before age 45 years. After age 65, women are at higher risk than men. Risk factors you can control include:  Not getting enough exercise or physical activity.  Being overweight.  Getting too much fat, sugar, calories, or salt in your diet.  Drinking too much alcohol. What are the signs or symptoms? Hypertension does not usually cause signs or symptoms. Extremely high blood pressure (hypertensive crisis) may cause headache, anxiety, shortness of breath, and nosebleed. How is this diagnosed? To check if you have hypertension, your health care provider will measure your blood pressure while you are seated, with your arm held at the level of your heart. It should be measured at least twice using the same arm. Certain conditions can cause a difference in blood pressure between your right and left arms. A blood pressure reading that is higher than normal on one occasion does  not mean that you need treatment. If it is not clear whether you have high blood pressure, you may be asked to return on a different day to have your blood pressure checked again. Or, you may be asked to monitor your blood pressure at home for 1 or more weeks. How is this treated? Treating high blood pressure includes making lifestyle changes and possibly taking medicine. Living a healthy lifestyle can help lower high blood pressure. You may need to change some of your habits. Lifestyle changes may include:  Following the DASH diet. This diet is high in fruits, vegetables, and whole grains. It is low in salt, red meat, and added sugars.  Keep your sodium intake below 2,300 mg per day.  Getting at least 30-45 minutes of aerobic exercise at least 4 times per week.  Losing weight if necessary.  Not smoking.  Limiting alcoholic beverages.  Learning ways to reduce stress. Your health care provider may prescribe medicine if lifestyle changes are not enough to get your blood pressure under control, and if one of the following is true:  You are 18-59 years of age and your systolic blood pressure is above 140.  You are 60 years of age or older, and your systolic blood pressure is above 150.  Your diastolic blood pressure is above 90.  You have diabetes, and your systolic blood pressure is over 140 or your diastolic blood pressure is over 90.  You have kidney disease and your blood pressure is above 140/90.  You have heart disease and your blood pressure is above 140/90. Your personal target blood pressure may vary depending on your medical   conditions, your age, and other factors. Follow these instructions at home:  Have your blood pressure rechecked as directed by your health care provider.  Take medicines only as directed by your health care provider. Follow the directions carefully. Blood pressure medicines must be taken as prescribed. The medicine does not work as well when you skip  doses. Skipping doses also puts you at risk for problems.  Do not smoke.  Monitor your blood pressure at home as directed by your health care provider. Contact a health care provider if:  You think you are having a reaction to medicines taken.  You have recurrent headaches or feel dizzy.  You have swelling in your ankles.  You have trouble with your vision. Get help right away if:  You develop a severe headache or confusion.  You have unusual weakness, numbness, or feel faint.  You have severe chest or abdominal pain.  You vomit repeatedly.  You have trouble breathing. This information is not intended to replace advice given to you by your health care provider. Make sure you discuss any questions you have with your health care provider. Document Released: 09/09/2005 Document Revised: 02/15/2016 Document Reviewed: 07/02/2013 Elsevier Interactive Patient Education  2017 [employer] Inc.  

## 2016-08-09 NOTE — Progress Notes (Signed)
Subjective:    Patient ID: Amy Cardenas, female    DOB: 06/24/1971, 45 y.o.   MRN: [id number]  Pt presents to the office today with elevated BP at home. PT states she checked her BP at home and it was 144 /112 and 138/89. Pt states she was worried this was too high.  Hypertension  This is a chronic problem. The current episode started more than 1 year ago. The problem has been waxing and waning since onset. The problem is controlled. Associated symptoms include headaches. Pertinent negatives include no anxiety, blurred vision, peripheral edema or shortness of breath. Risk factors for coronary artery disease include obesity and sedentary lifestyle. Past treatments include nothing. The current treatment provides mild improvement. There is no history of kidney disease, CAD/MI, CVA or heart failure.  Headache   This is a new problem. The current episode started yesterday. The problem occurs intermittently. The problem has been waxing and waning. The pain is located in the occipital region. The pain does not radiate. The pain quality is similar to prior headaches. The quality of the pain is described as aching. The pain is at a severity of 6/10. The pain is mild. Pertinent negatives include no blurred vision, nausea, phonophobia, photophobia or vomiting. The symptoms are aggravated by emotional stress. She has tried NSAIDs and acetaminophen for the symptoms. The treatment provided mild relief. Her past medical history is significant for hypertension.      Review of Systems  Eyes: Negative for blurred vision and photophobia.  Respiratory: Negative for shortness of breath.   Gastrointestinal: Negative for nausea and vomiting.  Neurological: Positive for headaches.  All other systems reviewed and are negative.      Objective:   Physical Exam  Constitutional: She is oriented to person, place, and time. She appears well-developed and well-nourished. No distress.  HENT:  Head: Normocephalic.  Eyes:  Pupils are equal, round, and reactive to light.  Neck: Normal range of motion. Neck supple. No thyromegaly present.  Cardiovascular: Normal rate, regular rhythm, normal heart sounds and intact distal pulses.   No murmur heard. Pulmonary/Chest: Effort normal and breath sounds normal. No respiratory distress. She has no wheezes.  Abdominal: Soft. Bowel sounds are normal. She exhibits no distension. There is no tenderness.  Musculoskeletal: Normal range of motion. She exhibits no edema or tenderness.  Neurological: She is alert and oriented to person, place, and time.  Skin: Skin is warm and dry.  Psychiatric: She has a normal mood and affect. Her behavior is normal. Judgment and thought content normal.  Vitals reviewed.     BP 131/87   Pulse 90   Temp 97.9 F (36.6 C) (Oral)   Ht 5' 2" (1.575 m)   Wt 294 lb 3.2 oz (133.4 kg)   BMI 53.81 kg/m      Assessment & Plan:  1. Essential hypertension --Daily blood pressure log given with instructions on how to fill out and told to bring to next visit -Dash diet information given -Exercise encouraged - Stress Management  -Continue current meds -RTO in 6 months - CMP14+EGFR  2. Other fatigue - CMP14+EGFR   Continue all meds Labs pending Health Maintenance reviewed Diet and exercise encouraged RTO 6 months  Christy Hawks, FNP  

## 2016-08-10 LAB — CMP14+EGFR
A/G RATIO: 1.2 (ref 1.2–2.2)
ALBUMIN: 3.8 g/dL (ref 3.5–5.5)
ALK PHOS: 59 IU/L (ref 39–117)
ALT: 11 IU/L (ref 0–32)
AST: 18 IU/L (ref 0–40)
BILIRUBIN TOTAL: 0.6 mg/dL (ref 0.0–1.2)
BUN / CREAT RATIO: 13 (ref 9–23)
BUN: 10 mg/dL (ref 6–24)
CHLORIDE: 99 mmol/L (ref 96–106)
CO2: 28 mmol/L (ref 18–29)
Calcium: 8.8 mg/dL (ref 8.7–10.2)
Creatinine, Ser: 0.8 mg/dL (ref 0.57–1.00)
GFR calc Af Amer: 104 mL/min/{1.73_m2} (ref >59)
GFR calc non Af Amer: 90 mL/min/{1.73_m2} (ref >59)
GLUCOSE: 83 mg/dL (ref 65–99)
Globulin, Total: 3.1 g/dL (ref 1.5–4.5)
POTASSIUM: 3.9 mmol/L (ref 3.5–5.2)
Sodium: 141 mmol/L (ref 134–144)
TOTAL PROTEIN: 6.9 g/dL (ref 6.0–8.5)

## 2016-08-29 ENCOUNTER — Ambulatory Visit
Admission: RE | Admit: 2016-08-29 | Discharge: 2016-08-29 | Disposition: A | Discharge status: Home / Self Care | Payer: BLUE CROSS/BLUE SHIELD | Source: Ambulatory Visit | Attending: Obstetrics and Gynecology | Admitting: Obstetrics and Gynecology

## 2016-08-29 ENCOUNTER — Ambulatory Visit (HOSPITAL_COMMUNITY)
Admission: RE | Admit: 2016-08-29 | Discharge: 2016-08-29 | Disposition: A | Discharge status: Home / Self Care | Payer: No Typology Code available for payment source | Source: Ambulatory Visit | Attending: Obstetrics and Gynecology | Admitting: Obstetrics and Gynecology

## 2016-08-29 ENCOUNTER — Encounter (HOSPITAL_COMMUNITY): Payer: Self-pay

## 2016-08-29 VITALS — BP 118/72 | Temp 98.2°F | Ht 61.0 in | Wt 295.6 lb

## 2016-08-29 DIAGNOSIS — Z01419 Encounter for gynecological examination (general) (routine) without abnormal findings: Secondary | ICD-10-CM

## 2016-08-29 DIAGNOSIS — Z1231 Encounter for screening mammogram for malignant neoplasm of breast: Secondary | ICD-10-CM

## 2016-08-29 NOTE — Patient Instructions (Signed)
Explained breast self awareness to Amy R Degollado. Let patient know [facility] will cover Pap smears and HPV typing every 5 years unless has a history of abnormal Pap smears. Referred patient to the [facility] for a screening mammogram. Appointment scheduled for Thursday, August 29, 2016 at 1210. Let [facility] know [facility] will follow up with her within the next couple weeks with results of mammogram by letter or phone. Amy Cardenas verbalized understanding.  Brannock, Arvil Chaco, RN 10:34 AM

## 2016-08-29 NOTE — Progress Notes (Addendum)
No complaints today.   Pap Smear: Pap smear completed today. Last Pap smear was in February 2013 at [facility] and normal per patient. Per patient has no history of an abnormal Pap smear. Last pap smear result is not in EPIC but previous result is in EPIC.  Physical exam: Breasts Breasts symmetrical. No skin abnormalities bilateral breasts. No nipple retraction bilateral breasts. No nipple discharge bilateral breasts. No lymphadenopathy. No lumps palpated bilateral breasts. No complaints of pain or tenderness on exam. Referred patient to the [facility] for a screening mammogram. Appointment scheduled for Thursday, August 29, 2016 at 1210.  Pelvic/Bimanual   Ext Genitalia No lesions, no swelling and no discharge observed on external genitalia.         Vagina Vagina pink and normal texture. No lesions or discharge observed in vagina.          Cervix Cervix is present. Cervix pink and of normal texture. No discharge observed.     Uterus Uterus is present and palpable. Uterus in normal position and normal size.        Adnexae Bilateral ovaries present and unable to palpate. No tenderness on palpation.          Rectovaginal No rectal exam completed today since patient had no rectal complaints. No skin abnormalities observed on exam.    Smoking History: Patient has never smoked.  Patient Navigation: Patient education provided. Access to services provided for patient through [facility] program.

## 2016-08-29 NOTE — Addendum Note (Signed)
Encounter addended by: Loletta Parish, RN on: 08/29/2016 12:02 PM<BR>    Actions taken: Sign clinical note

## 2016-08-30 ENCOUNTER — Encounter (HOSPITAL_COMMUNITY): Payer: Self-pay | Admitting: *Deleted

## 2016-09-03 ENCOUNTER — Telehealth (HOSPITAL_COMMUNITY): Payer: Self-pay | Admitting: *Deleted

## 2016-09-03 LAB — CYTOLOGY - PAP
Diagnosis: NEGATIVE
HPV: NOT DETECTED

## 2016-09-03 NOTE — Telephone Encounter (Signed)
Telephoned patient at home number and discussed negative pap smear results. HPV was negative. Next pap smear due in one year. Patient voiced understanding.

## 2016-11-30 ENCOUNTER — Encounter (HOSPITAL_COMMUNITY): Payer: Self-pay | Admitting: Emergency Medicine

## 2016-11-30 ENCOUNTER — Emergency Department (HOSPITAL_COMMUNITY)
Admission: EM | Admit: 2016-11-30 | Discharge: 2016-11-30 | Disposition: A | Discharge status: Home / Self Care | Payer: Medicaid Other | Attending: Emergency Medicine | Admitting: Emergency Medicine

## 2016-11-30 ENCOUNTER — Emergency Department (HOSPITAL_COMMUNITY): Payer: Medicaid Other

## 2016-11-30 DIAGNOSIS — I10 Essential (primary) hypertension: Secondary | ICD-10-CM | POA: Insufficient documentation

## 2016-11-30 DIAGNOSIS — Z87891 Personal history of nicotine dependence: Secondary | ICD-10-CM | POA: Insufficient documentation

## 2016-11-30 DIAGNOSIS — K5792 Diverticulitis of intestine, part unspecified, without perforation or abscess without bleeding: Secondary | ICD-10-CM | POA: Diagnosis not present

## 2016-11-30 DIAGNOSIS — R1030 Lower abdominal pain, unspecified: Secondary | ICD-10-CM | POA: Diagnosis present

## 2016-11-30 LAB — COMPREHENSIVE METABOLIC PANEL
ALBUMIN: 3.9 g/dL (ref 3.5–5.0)
ALK PHOS: 54 U/L (ref 38–126)
ALT: 15 U/L (ref 14–54)
AST: 15 U/L (ref 15–41)
Anion gap: 7 (ref 5–15)
BUN: 9 mg/dL (ref 6–20)
CALCIUM: 9 mg/dL (ref 8.9–10.3)
CHLORIDE: 101 mmol/L (ref 101–111)
CO2: 28 mmol/L (ref 22–32)
Creatinine, Ser: 0.86 mg/dL (ref 0.44–1.00)
GFR calc Af Amer: >60 mL/min (ref >60)
GFR calc non Af Amer: >60 mL/min (ref >60)
GLUCOSE: 101 mg/dL — AB (ref 65–99)
Potassium: 3.8 mmol/L (ref 3.5–5.1)
SODIUM: 136 mmol/L (ref 135–145)
Total Bilirubin: 1.1 mg/dL (ref 0.3–1.2)
Total Protein: 8 g/dL (ref 6.5–8.1)

## 2016-11-30 LAB — CBC
HCT: 33.1 % — ABNORMAL LOW (ref 36.0–46.0)
HEMOGLOBIN: 10.2 g/dL — AB (ref 12.0–15.0)
MCH: 21.5 pg — ABNORMAL LOW (ref 26.0–34.0)
MCHC: 30.8 g/dL (ref 30.0–36.0)
MCV: 69.7 fL — ABNORMAL LOW (ref 78.0–100.0)
PLATELETS: 403 10*3/uL — AB (ref 150–400)
RBC: 4.75 MIL/uL (ref 3.87–5.11)
RDW: 17.3 % — ABNORMAL HIGH (ref 11.5–15.5)
WBC: 13.8 10*3/uL — AB (ref 4.0–10.5)

## 2016-11-30 LAB — URINALYSIS, ROUTINE W REFLEX MICROSCOPIC
Bilirubin Urine: NEGATIVE
Glucose, UA: NEGATIVE mg/dL
Ketones, ur: NEGATIVE mg/dL
LEUKOCYTES UA: NEGATIVE
Nitrite: NEGATIVE
PROTEIN: NEGATIVE mg/dL
SPECIFIC GRAVITY, URINE: 1.019 (ref 1.005–1.030)
pH: 6 (ref 5.0–8.0)

## 2016-11-30 LAB — I-STAT BETA HCG BLOOD, ED (MC, WL, AP ONLY): I-stat hCG, quantitative: <5 m[IU]/mL (ref <5)

## 2016-11-30 MED ORDER — HYDROCODONE-ACETAMINOPHEN 5-325 MG PO TABS: 1.0000 | ORAL_TABLET | ORAL | 0 refills | Status: DC | PRN

## 2016-11-30 MED ORDER — MORPHINE SULFATE (PF) 4 MG/ML IV SOLN
4.0000 mg | Freq: Once | INTRAVENOUS | Status: AC
  Administered 2016-11-30: 4 mg via INTRAVENOUS
  Filled 2016-11-30: qty 1

## 2016-11-30 MED ORDER — METRONIDAZOLE 500 MG PO TABS
500.0000 mg | ORAL_TABLET | Freq: Once | ORAL | Status: AC
Start: 2016-11-30 — End: 2016-11-30
  Administered 2016-11-30: 500 mg via ORAL
  Filled 2016-11-30: qty 1

## 2016-11-30 MED ORDER — CIPROFLOXACIN HCL 250 MG PO TABS
500.0000 mg | ORAL_TABLET | Freq: Once | ORAL | Status: AC
  Administered 2016-11-30: 500 mg via ORAL
  Filled 2016-11-30: qty 2

## 2016-11-30 MED ORDER — IOPAMIDOL (ISOVUE-300) INJECTION 61%
100.0000 mL | Freq: Once | INTRAVENOUS | Status: AC | PRN
  Administered 2016-11-30: 100 mL via INTRAVENOUS

## 2016-11-30 MED ORDER — MORPHINE SULFATE (PF) 4 MG/ML IV SOLN
4.0000 mg | Freq: Once | INTRAVENOUS | Status: AC
  Administered 2016-11-30: 4 mg via INTRAVENOUS

## 2016-11-30 MED ORDER — METRONIDAZOLE 500 MG PO TABS: 500.0000 mg | ORAL_TABLET | Freq: Two times a day (BID) | ORAL | 0 refills | Status: DC

## 2016-11-30 MED ORDER — CIPROFLOXACIN HCL 500 MG PO TABS: 500.0000 mg | ORAL_TABLET | Freq: Two times a day (BID) | ORAL | 0 refills | Status: DC

## 2016-11-30 MED ORDER — MORPHINE SULFATE (PF) 4 MG/ML IV SOLN
INTRAVENOUS | Status: AC
  Filled 2016-11-30: qty 1

## 2016-11-30 MED ORDER — IOPAMIDOL (ISOVUE-300) INJECTION 61%
INTRAVENOUS | Status: AC
  Administered 2016-11-30: 30 mL via ORAL
  Filled 2016-11-30: qty 30

## 2016-11-30 MED ORDER — POLYETHYLENE GLYCOL 3350 17 GM/SCOOP PO POWD: 17.0000 g | Freq: Every day | ORAL | 0 refills | Status: DC

## 2016-11-30 NOTE — ED Triage Notes (Signed)
Pt reports having pain in lower abdomen and around umbilicus.  States she is only having very small amts with bowel movements.

## 2016-11-30 NOTE — ED Provider Notes (Signed)
[facility] DEPT Provider Note   CSN: [id number] Arrival date & time: 11/30/16  1423     History   Chief Complaint Chief Complaint  Patient presents with  . Abdominal Pain    HPI Amy Cardenas is a 46 y.o. female.  HPI Patient presents with 2 days of worsening abdominal pain and her lower abdomen around her umbilicus.  She does have a known umbilical hernia.  She reports having small bowel movements.  She denies nausea vomiting.  Chills without documented fever.  Pain is moderate in severity.  No other complaints.  No dysuria or urinary frequency   Past Medical History:  Diagnosis Date  . Anemia   . Anxiety   . Essential hypertension   . Obesity     Patient Active Problem List   Diagnosis Date Noted  . Atypical chest pain 10/25/2014  . Essential hypertension 10/25/2014  . Cardiac murmur 10/25/2014  . Severe obesity (BMI >= 40) ([place]) 06/07/2013    Past Surgical History:  Procedure Laterality Date  . CESAREAN SECTION    . CESAREAN SECTION WITH BILATERAL TUBAL LIGATION  10/15/2012   Procedure: CESAREAN SECTION WITH BILATERAL TUBAL LIGATION;  Surgeon: Allyn Kenner, DO;  Location: [facility];  Service: Obstetrics;  Laterality: N/A;  Repeat  . TUBAL LIGATION    . WISDOM TOOTH EXTRACTION  1990    OB History    Gravida Para Term Preterm AB Living   2 2 2     2    SAB TAB Ectopic Multiple Live Births           2       Home Medications    Prior to Admission medications   Medication Sig Start Date End Date Taking? Authorizing Provider  ciprofloxacin (CIPRO) 500 MG tablet Take 1 tablet (500 mg total) by mouth 2 (two) times daily. 11/30/16   Jola Schmidt, MD  HYDROcodone-acetaminophen (NORCO/VICODIN) 5-325 MG tablet Take 1 tablet by mouth every 4 (four) hours as needed for moderate pain. 11/30/16   Jola Schmidt, MD  metroNIDAZOLE (FLAGYL) 500 MG tablet Take 1 tablet (500 mg total) by mouth 2 (two) times daily. 11/30/16   Jola Schmidt, MD  polyethylene glycol powder  [facility]) powder Take 17 g by mouth daily. 11/30/16   Jola Schmidt, MD    Family History Family History  Problem Relation Age of Onset  . Diabetes Father   . Colon cancer Maternal Grandmother     Social History Social History  Substance Use Topics  . Smoking status: Former Smoker    Packs/day: 0.15    Years: 3.00    Types: Cigarettes    Start date: 08/24/2007    Quit date: 09/23/2008  . Smokeless tobacco: Never Used  . Alcohol use No     Allergies   Penicillins and Sulfa antibiotics   Review of Systems Review of Systems  All other systems reviewed and are negative.    Physical Exam Updated Vital Signs BP 123/81   Pulse 91   Temp 99.4 F (37.4 C) (Oral)   Resp 20   Ht 5\' 1"  (1.549 m)   Wt 295 lb (133.8 kg)   LMP 09/23/2016 Comment: neg hcg  SpO2 98%   BMI 55.74 kg/m   Physical Exam  Constitutional: She is oriented to person, place, and time. She appears well-developed and well-nourished. No distress.  HENT:  Head: Normocephalic and atraumatic.  Eyes: EOM are normal.  Neck: Normal range of motion.  Cardiovascular: Normal rate, regular  rhythm and normal heart sounds.   Pulmonary/Chest: Effort normal and breath sounds normal.  Abdominal: Soft. She exhibits no distension.  Mild generalized lower abdominal tenderness without guarding or rebound.  Easily reducible umbilical hernia  Musculoskeletal: Normal range of motion.  Neurological: She is alert and oriented to person, place, and time.  Skin: Skin is warm and dry.  Psychiatric: She has a normal mood and affect. Judgment normal.  Nursing note and vitals reviewed.    ED Treatments / Results  Labs (all labs ordered are listed, but only abnormal results are displayed) Labs Reviewed  CBC - Abnormal; Notable for the following:       Result Value   WBC 13.8 (*)    Hemoglobin 10.2 (*)    HCT 33.1 (*)    MCV 69.7 (*)    MCH 21.5 (*)    RDW 17.3 (*)    Platelets 403 (*)    All other components within  normal limits  COMPREHENSIVE METABOLIC PANEL - Abnormal; Notable for the following:    Glucose, Bld 101 (*)    All other components within normal limits  URINALYSIS, ROUTINE W REFLEX MICROSCOPIC - Abnormal; Notable for the following:    Hgb urine dipstick SMALL (*)    Bacteria, UA RARE (*)    All other components within normal limits  I-STAT BETA HCG BLOOD, ED ([facility], [facility], [facility] ONLY)    EKG  EKG Interpretation None       Radiology Ct Abdomen Pelvis W Contrast  Result Date: 11/30/2016 CLINICAL DATA:  Umbilical hernia. Lower abdominal/ pelvic pain and abdominal distention. EXAM: CT ABDOMEN AND PELVIS WITH CONTRAST TECHNIQUE: Multidetector CT imaging of the abdomen and pelvis was performed using the standard protocol following bolus administration of intravenous contrast. CONTRAST:  186mL ISOVUE-300 IOPAMIDOL (ISOVUE-300) INJECTION 61% COMPARISON:  None. FINDINGS: Lower chest: No significant pulmonary nodules or acute consolidative airspace disease. Mild cardiomegaly . Hepatobiliary: Mild diffuse hepatic steatosis. No liver masses. Punctate calcified gallstone layering in the gallbladder neck, with no gallbladder distention, gallbladder wall thickening or pericholecystic fluid. No biliary ductal dilatation. Pancreas: Normal, with no mass or duct dilation. Spleen: Normal size. No mass. Adrenals/Urinary Tract: Normal adrenals. Normal kidneys with no hydronephrosis and no renal mass. Normal bladder. Stomach/Bowel: Grossly normal stomach. Normal caliber small bowel with no small bowel wall thickening. Normal appendix . There is mild to moderate sigmoid diverticulosis. There is segmental wall thickening in the mid sigmoid colon with prominent associated pericolonic fat stranding, compatible with acute sigmoid diverticulitis. No pericolonic abscess or free air. Vascular/Lymphatic: Normal caliber abdominal aorta . Patent portal, splenic, hepatic and renal veins. Mildly enlarged 1.4 cm left external iliac node  (series 2/ image 69). No additional pathologically enlarged abdominopelvic lymph nodes. Reproductive: Grossly normal uterus. No adnexal mass. Bilateral tubal ligation clips are in place. Other: No pneumoperitoneum, ascites or focal fluid collection. Small fat containing umbilical hernia. Moderate fat containing midline supraumbilical ventral abdominal hernia. Musculoskeletal: No aggressive appearing focal osseous lesions. Mild thoracolumbar spondylosis. IMPRESSION: 1. Acute sigmoid diverticulitis.  No free air or abscess. 2. Small fat containing umbilical hernia. Moderate fat containing midline supraumbilical ventral abdominal hernia. 3. Mild cardiomegaly. 4. Mild diffuse hepatic steatosis. 5. Cholelithiasis. Electronically Signed   By: Ilona Sorrel M.D.   On: 11/30/2016 17:49    Procedures Procedures (including critical care time)  Medications Ordered in ED Medications  morphine 4 MG/ML injection 4 mg (4 mg Intravenous Given 11/30/16 1526)  iopamidol (ISOVUE-300) 61 % injection (30  mLs Oral Contrast Given 11/30/16 1554)  iopamidol (ISOVUE-300) 61 % injection 100 mL (100 mLs Intravenous Contrast Given 11/30/16 1707)  morphine 4 MG/ML injection 4 mg (4 mg Intravenous Given 11/30/16 1818)  ciprofloxacin (CIPRO) tablet 500 mg (500 mg Oral Given 11/30/16 1821)  metroNIDAZOLE (FLAGYL) tablet 500 mg (500 mg Oral Given 11/30/16 1821)     Initial Impression / Assessment and Plan / ED Course  I have reviewed the triage vital signs and the nursing notes.  Pertinent labs & imaging results that were available during my care of the patient were reviewed by me and considered in my medical decision making (see chart for details).     Overall the patient is well-appearing.  No peritoneal signs.  Diverticulitis noted on CT.  Home with Cipro and Flagyl.  She understands to return to the ER for new or worsening symptoms.  She was given general surgery number for her easily reducible umbilical hernia  Final Clinical  Impressions(s) / ED Diagnoses   Final diagnoses:  Diverticulitis of intestine without perforation or abscess without bleeding, unspecified part of intestinal tract    New Prescriptions New Prescriptions   CIPROFLOXACIN (CIPRO) 500 MG TABLET    Take 1 tablet (500 mg total) by mouth 2 (two) times daily.   HYDROCODONE-ACETAMINOPHEN (NORCO/VICODIN) 5-325 MG TABLET    Take 1 tablet by mouth every 4 (four) hours as needed for moderate pain.   METRONIDAZOLE (FLAGYL) 500 MG TABLET    Take 1 tablet (500 mg total) by mouth 2 (two) times daily.   POLYETHYLENE GLYCOL POWDER (MIRALAX) POWDER    Take 17 g by mouth daily.     Jola Schmidt, MD 11/30/16 2158

## 2016-11-30 NOTE — ED Notes (Signed)
CT notified pt has finished drinking contrast

## 2016-11-30 NOTE — ED Triage Notes (Signed)
Reports hernia and constipation Followed by Dr Lenna Gilford

## 2017-01-29 ENCOUNTER — Encounter (HOSPITAL_COMMUNITY): Payer: Self-pay | Admitting: Emergency Medicine

## 2017-01-29 ENCOUNTER — Emergency Department (HOSPITAL_COMMUNITY): Payer: Medicaid Other

## 2017-01-29 ENCOUNTER — Emergency Department (HOSPITAL_COMMUNITY)
Admission: EM | Admit: 2017-01-29 | Discharge: 2017-01-30 | Disposition: A | Discharge status: Home / Self Care | Payer: Medicaid Other | Attending: Emergency Medicine | Admitting: Emergency Medicine

## 2017-01-29 DIAGNOSIS — J189 Pneumonia, unspecified organism: Secondary | ICD-10-CM

## 2017-01-29 DIAGNOSIS — J181 Lobar pneumonia, unspecified organism: Secondary | ICD-10-CM | POA: Diagnosis not present

## 2017-01-29 DIAGNOSIS — Y929 Unspecified place or not applicable: Secondary | ICD-10-CM | POA: Insufficient documentation

## 2017-01-29 DIAGNOSIS — Y999 Unspecified external cause status: Secondary | ICD-10-CM | POA: Diagnosis not present

## 2017-01-29 DIAGNOSIS — Y939 Activity, unspecified: Secondary | ICD-10-CM | POA: Diagnosis not present

## 2017-01-29 DIAGNOSIS — Z79899 Other long term (current) drug therapy: Secondary | ICD-10-CM | POA: Insufficient documentation

## 2017-01-29 DIAGNOSIS — I10 Essential (primary) hypertension: Secondary | ICD-10-CM | POA: Diagnosis not present

## 2017-01-29 DIAGNOSIS — W57XXXA Bitten or stung by nonvenomous insect and other nonvenomous arthropods, initial encounter: Secondary | ICD-10-CM | POA: Diagnosis not present

## 2017-01-29 DIAGNOSIS — S80861A Insect bite (nonvenomous), right lower leg, initial encounter: Secondary | ICD-10-CM | POA: Insufficient documentation

## 2017-01-29 DIAGNOSIS — S80862A Insect bite (nonvenomous), left lower leg, initial encounter: Secondary | ICD-10-CM | POA: Diagnosis not present

## 2017-01-29 DIAGNOSIS — Z87891 Personal history of nicotine dependence: Secondary | ICD-10-CM | POA: Diagnosis not present

## 2017-01-29 DIAGNOSIS — R05 Cough: Secondary | ICD-10-CM | POA: Diagnosis present

## 2017-01-29 LAB — CBC WITH DIFFERENTIAL/PLATELET
Basophils Absolute: 0 10*3/uL (ref 0.0–0.1)
Basophils Relative: 0 %
EOS PCT: 1 %
Eosinophils Absolute: 0.2 10*3/uL (ref 0.0–0.7)
HEMATOCRIT: 32.5 % — AB (ref 36.0–46.0)
Hemoglobin: 9.9 g/dL — ABNORMAL LOW (ref 12.0–15.0)
LYMPHS ABS: 1.7 10*3/uL (ref 0.7–4.0)
LYMPHS PCT: 14 %
MCH: 21.2 pg — ABNORMAL LOW (ref 26.0–34.0)
MCHC: 30.5 g/dL (ref 30.0–36.0)
MCV: 69.6 fL — AB (ref 78.0–100.0)
Monocytes Absolute: 0.7 10*3/uL (ref 0.1–1.0)
Monocytes Relative: 6 %
NEUTROS ABS: 9.4 10*3/uL — AB (ref 1.7–7.7)
Neutrophils Relative %: 79 %
Platelets: 398 10*3/uL (ref 150–400)
RBC: 4.67 MIL/uL (ref 3.87–5.11)
RDW: 17.3 % — ABNORMAL HIGH (ref 11.5–15.5)
WBC: 12 10*3/uL — ABNORMAL HIGH (ref 4.0–10.5)

## 2017-01-29 LAB — BASIC METABOLIC PANEL
ANION GAP: 9 (ref 5–15)
BUN: 13 mg/dL (ref 6–20)
CHLORIDE: 99 mmol/L — AB (ref 101–111)
CO2: 27 mmol/L (ref 22–32)
Calcium: 9.2 mg/dL (ref 8.9–10.3)
Creatinine, Ser: 1.05 mg/dL — ABNORMAL HIGH (ref 0.44–1.00)
GFR calc Af Amer: >60 mL/min (ref >60)
GFR calc non Af Amer: >60 mL/min (ref >60)
GLUCOSE: 135 mg/dL — AB (ref 65–99)
POTASSIUM: 3.7 mmol/L (ref 3.5–5.1)
Sodium: 135 mmol/L (ref 135–145)

## 2017-01-29 MED ORDER — HYDROCOD POLST-CPM POLST ER 10-8 MG/5ML PO SUER
5.0000 mL | Freq: Once | ORAL | Status: AC
  Administered 2017-01-29: 5 mL via ORAL
  Filled 2017-01-29: qty 5

## 2017-01-29 MED ORDER — IBUPROFEN 800 MG PO TABS
800.0000 mg | ORAL_TABLET | Freq: Once | ORAL | Status: AC
  Administered 2017-01-29: 800 mg via ORAL
  Filled 2017-01-29: qty 1

## 2017-01-29 MED ORDER — IPRATROPIUM-ALBUTEROL 0.5-2.5 (3) MG/3ML IN SOLN
3.0000 mL | Freq: Once | RESPIRATORY_TRACT | Status: AC
  Administered 2017-01-29: 3 mL via RESPIRATORY_TRACT
  Filled 2017-01-29: qty 3

## 2017-01-29 MED ORDER — CLINDAMYCIN PHOSPHATE 600 MG/50ML IV SOLN
600.0000 mg | Freq: Once | INTRAVENOUS | Status: AC
  Administered 2017-01-29: 600 mg via INTRAVENOUS
  Filled 2017-01-29: qty 50

## 2017-01-29 MED ORDER — HYDROXYZINE HCL 25 MG PO TABS
25.0000 mg | ORAL_TABLET | Freq: Once | ORAL | Status: AC
  Administered 2017-01-29: 25 mg via ORAL
  Filled 2017-01-29: qty 1

## 2017-01-29 MED ORDER — AZITHROMYCIN 250 MG PO TABS
500.0000 mg | ORAL_TABLET | Freq: Once | ORAL | Status: AC
  Administered 2017-01-29: 500 mg via ORAL
  Filled 2017-01-29: qty 2

## 2017-01-29 NOTE — ED Provider Notes (Signed)
[facility] DEPT Provider Note   CSN: [id number] Arrival date & time: 01/29/17  2027  By signing my name below, I, Margit Banda, attest that this documentation has been prepared under the direction and in the presence of [employer] .  Electronically Signed: Margit Banda, ED Scribe. 01/29/17. 10:03 PM.  History   Chief Complaint Chief Complaint  Patient presents with  . Fever  . Cough    HPI [place] R Chisom is a 46 y.o. female who presents to the Emergency Department complaining of a gradually worsening fever (102) that started to day. Associated sx include HA, cough, and wheezing. She notes she was bitten by mosquitos on Monday and that she is allergic to them. Pt reports seasonal allergies and she has been taking zyrtec with mild relief. Pt denies nausea, vomiting and any other sx at this time.   The history is provided by the patient. No language interpreter was used.  Fever   This is a new problem. The current episode started 12 to 24 hours ago. The problem has not changed since onset.The maximum temperature noted was 102 to 102.9 F. Associated symptoms include headaches and cough. Pertinent negatives include no vomiting.  Cough  This is a new problem. The current episode started more than 2 days ago. The problem has not changed since onset.The maximum temperature recorded prior to her arrival was 102 to 102.9 F. The fever has been present for 1 to 2 days. Associated symptoms include headaches and wheezing.    Past Medical History:  Diagnosis Date  . Anemia   . Anxiety   . Essential hypertension   . Obesity     Patient Active Problem List   Diagnosis Date Noted  . Atypical chest pain 10/25/2014  . Essential hypertension 10/25/2014  . Cardiac murmur 10/25/2014  . Severe obesity (BMI >= 40) ([place]) 06/07/2013    Past Surgical History:  Procedure Laterality Date  . CESAREAN SECTION    . CESAREAN SECTION WITH BILATERAL TUBAL LIGATION  10/15/2012   Procedure:  CESAREAN SECTION WITH BILATERAL TUBAL LIGATION;  Surgeon: Allyn Kenner, DO;  Location: [facility];  Service: Obstetrics;  Laterality: N/A;  Repeat  . TUBAL LIGATION    . WISDOM TOOTH EXTRACTION  1990    OB History    Gravida Para Term Preterm AB Living   2 2 2     2    SAB TAB Ectopic Multiple Live Births           2       Home Medications    Prior to Admission medications   Medication Sig Start Date End Date Taking? Authorizing Provider  ciprofloxacin (CIPRO) 500 MG tablet Take 1 tablet (500 mg total) by mouth 2 (two) times daily. 11/30/16   Jola Schmidt, MD  HYDROcodone-acetaminophen (NORCO/VICODIN) 5-325 MG tablet Take 1 tablet by mouth every 4 (four) hours as needed for moderate pain. 11/30/16   Jola Schmidt, MD  metroNIDAZOLE (FLAGYL) 500 MG tablet Take 1 tablet (500 mg total) by mouth 2 (two) times daily. 11/30/16   Jola Schmidt, MD  polyethylene glycol powder (MIRALAX) powder Take 17 g by mouth daily. 11/30/16   Jola Schmidt, MD    Family History Family History  Problem Relation Age of Onset  . Diabetes Father   . Colon cancer Maternal Grandmother     Social History Social History  Substance Use Topics  . Smoking status: Former Smoker    Packs/day: 0.15    Years: 3.00  Types: Cigarettes    Start date: 08/24/2007    Quit date: 09/23/2008  . Smokeless tobacco: Never Used  . Alcohol use No     Allergies   Penicillins and Sulfa antibiotics   Review of Systems Review of Systems  Constitutional: Positive for fever.  Respiratory: Positive for cough and wheezing.   Gastrointestinal: Negative for nausea and vomiting.  Neurological: Positive for headaches.  All other systems reviewed and are negative.    Physical Exam Updated Vital Signs BP (!) 173/83   Pulse (!) 112   Temp (!) 102.7 F (39.3 C)   Resp 20   Ht 5\' 1"  (1.549 m)   Wt 283 lb (128.4 kg)   LMP 01/23/2017   SpO2 100%   BMI 53.47 kg/m   Physical Exam  Constitutional: She appears  well-developed and well-nourished. No distress.  HENT:  Head: Normocephalic and atraumatic.  Airway is patent.  Nasal congestion is present.  Eyes: Conjunctivae are normal.  Neck: Normal range of motion.  Cardiovascular: Normal rate, regular rhythm and normal heart sounds.   Pulmonary/Chest: Effort normal.  Mild tachypnea present. Symmetrical rise and fall of the chest. Bilateral coarse rhonchi. R> L  Abdominal: Soft. Bowel sounds are normal. She exhibits no distension. There is no tenderness.  Musculoskeletal: Normal range of motion.  Capillary refill < 2 seconds. Multiple inset bites on right and left lower extremity.. Trace edema lower extremities..  DP  2+ bilat.  Neurological: She is alert.  Skin: Capillary refill takes less than 2 seconds. No pallor.  Psychiatric: She has a normal mood and affect. Her behavior is normal.  Nursing note and vitals reviewed.    ED Treatments / Results  DIAGNOSTIC STUDIES: Oxygen Saturation is 100% on RA, normal by my interpretation.   COORDINATION OF CARE: 10:03 PM-Discussed next steps with pt which include antibiotics. Pt verbalized understanding and is agreeable with the plan.    Labs (all labs ordered are listed, but only abnormal results are displayed) Labs Reviewed  CBC WITH DIFFERENTIAL/PLATELET - Abnormal; Notable for the following:       Result Value   WBC 12.0 (*)    Hemoglobin 9.9 (*)    HCT 32.5 (*)    MCV 69.6 (*)    MCH 21.2 (*)    RDW 17.3 (*)    Neutro Abs 9.4 (*)    All other components within normal limits  BASIC METABOLIC PANEL - Abnormal; Notable for the following:    Chloride 99 (*)    Glucose, Bld 135 (*)    Creatinine, Ser 1.05 (*)    All other components within normal limits    EKG  EKG Interpretation None       Radiology Dg Chest 2 View  Result Date: 01/29/2017 CLINICAL DATA:  Patient states worsening SOB, right lower side chest pain, and coughing x 2 weeks. States she gets bronchitis at least  once a year, hx of HTN and former smoker. EXAM: CHEST  2 VIEW COMPARISON:  Chest x-ray 12/14/2015 FINDINGS: Heart size is normal. There is patchy infiltrate in anterior right upper lobe. No pulmonary edema or pleural effusions. IMPRESSION: Right upper lobe infiltrate. Followup PA and lateral chest X-ray is recommended in 3-4 weeks following trial of antibiotic therapy to ensure resolution and exclude underlying malignancy. Electronically Signed   By: Nolon Nations M.D.   On: 01/29/2017 21:32    Procedures Procedures (including critical care time)  Medications Ordered in ED Medications  ipratropium-albuterol (DUONEB) 0.5-2.5 (3)  MG/3ML nebulizer solution 3 mL (not administered)  chlorpheniramine-HYDROcodone (TUSSIONEX) 10-8 MG/5ML suspension 5 mL (not administered)  ibuprofen (ADVIL,MOTRIN) tablet 800 mg (800 mg Oral Given 01/29/17 2037)     Initial Impression / Assessment and Plan / ED Course  I have reviewed the triage vital signs and the nursing notes.  Pertinent labs & imaging results that were available during my care of the patient were reviewed by me and considered in my medical decision making (see chart for details).    MDM 10:06 PM Pt has been sick over the last week. Having temperature elevation and cough. CXR reveals right lung pneumonia. Pt has multiple insect bites. No drainage. No red streaks. They are raised and warm to touch. The plan at this time will be tussionex, IV antibiotics and albuterol. Pt tolerated medications without problem. Cough improving. Rx for zithromax and hycodan given to the patient. Pt also given albuterol inhaler. Pt to follow up with Dr. Lenna Gilford next week for recheck. Final Clinical Impressions(s) / ED Diagnoses   Final diagnoses:  Community acquired pneumonia of right upper lobe of lung ([place])  Insect bite, initial encounter    New Prescriptions Discharge Medication List as of 01/30/2017  1:17 AM    START taking these medications   Details    azithromycin (ZITHROMAX) 250 MG tablet 1 po daily with food, Print    HYDROcodone-homatropine (HYCODAN) 5-1.5 MG/5ML syrup Take 5 mLs by mouth every 6 (six) hours as needed., Starting Thu 01/30/2017, Print        **I personally performed the services described in this documentation, which was scribed in my presence. The recorded information has been reviewed and is accurate.     Lily Kocher, PA-C 01/31/17 1635    Francine Graven, DO 02/02/17 [phone]

## 2017-01-29 NOTE — ED Triage Notes (Signed)
Pt c/o fever, headache, cough, wheezing and infected mosquito bites.

## 2017-01-30 MED ORDER — HYDROCODONE-HOMATROPINE 5-1.5 MG/5ML PO SYRP: 5.0000 mL | ORAL_SOLUTION | Freq: Four times a day (QID) | ORAL | 0 refills | Status: DC | PRN

## 2017-01-30 MED ORDER — AZITHROMYCIN 250 MG PO TABS: ORAL_TABLET | ORAL | 0 refills | Status: DC

## 2017-01-30 MED ORDER — ALBUTEROL SULFATE HFA 108 (90 BASE) MCG/ACT IN AERS
2.0000 | INHALATION_SPRAY | Freq: Once | RESPIRATORY_TRACT | Status: AC
  Administered 2017-01-30: 2 via RESPIRATORY_TRACT
  Filled 2017-01-30: qty 6.7

## 2017-01-30 NOTE — Discharge Instructions (Signed)
Your xray reveals a right lung pneumonia. Please use zithromax daily with food. Use 2 puffs of albuterol every 4 hours. Increase water and Gatorades. Use tylenol every 4 hours or ibuprofen every 6 hours for fever or aching. Use Hycodan for cough and congestion. This medication may cause drowsiness, use with caution. Please see MD at the [facility] for follow up of your pneumonia, or return to the Emergency Dept if any changes or problem. Warm tub soaks to the insect bites may be helpful. Apply benadryl cream to the affected bites. Use benadryl tab at bedtime for itching if needed.

## 2017-02-13 DIAGNOSIS — Z139 Encounter for screening, unspecified: Secondary | ICD-10-CM

## 2017-02-13 LAB — GLUCOSE, POCT (MANUAL RESULT ENTRY)

## 2017-02-13 NOTE — Congregational Nurse Program (Signed)
Congregational Nurse Program Note  Date of Encounter: 02/13/2017  Past Medical History: Past Medical History:  Diagnosis Date  . Anemia   . Anxiety   . Essential hypertension   . Obesity     Encounter Details:     CNP Questionnaire - 02/13/17 1400      Patient Demographics   Is this a new or existing patient? New   Patient is considered a/an Not Applicable   Race African-American/Black     Patient Assistance   Location of Patient [place]   Patient's financial/insurance status Low Income;Self-Pay (Uninsured)   Uninsured Patient ([place]) Yes   Interventions Counseled to make appt. with provider;Assisted patient in making appt.   Patient referred to apply for the following financial assistance [facility] assistance No   Assistance securing medications No   Educational health offerings Acute disease;Navigating the healthcare system     Encounter Details   Primary purpose of visit Acute Illness/Condition Visit;Post ED/Hospitalization Visit;Navigating the Healthcare System   Was an Emergency Department visit averted? Not Applicable   Does patient have a medical provider? No   Patient referred to Area Agency;Clinic;Establish PCP   Was a mental health screening completed? (GAINS tool) No   Does patient have dental issues? Yes   Was a dental referral made? No resources for a referral   Does patient have vision issues? Yes   Was a vision referral made? No   Does your patient have an abnormal blood pressure today? Yes   Since previous encounter, have you referred patient for abnormal blood pressure that resulted in a new diagnosis or medication change? No   Does your patient have an abnormal blood glucose today? No   Since previous encounter, have you referred patient for abnormal blood glucose that resulted in a new diagnosis or medication change? No   Was there a life-saving intervention made? No      New  client to Amy Cardenas was recently in ED and diagnosed with pneumonia per client and placed on antibiotics , cough syrup and a bronchodilator. Client reports she does work and supports two children. She cannot afford her employers insurance and is in need of a primary care provider.  Today she states that she is feeling much better and completed her course of antibiotics Zithromax and her cough syrup she reports she is still using her inhaler , but does not have much left. She denies shortness of breath nor productive cough. Afebrile today and denies any recent fever. Denies any loss of appetite or nausea or vomiting. On exam her lungs are clear, no wheezing. Oxygen saturation 99% or room air. Abdomen soft and non tender. Bowel sounds positive. Alert and oriented to person and place. Answers questions appropriately. Denies any thoughts of suicide.  Discussed options with client and called the [facility] and client given information she will need to provide to the free clinic in order to screen to become a patient. She states understanding and from there they will make her an appointment. She states she will be able to get the information there next week. RN reminded client that clinic will be closed Monday due to the holidays.   RN instructed to seek medical treatment either urgent care or Emergency room if she begins with fever, shortness of breath or prior symptoms return of cough, congestion, shortness of breath, fever. Client states understanding by verbal acknowledgment.  Contact information given to client  for the free clinic. RN also discussed with client about keeping hydrated with water to thin any congestion and secretions.   Will follow up with client next week.

## 2018-04-24 ENCOUNTER — Other Ambulatory Visit: Payer: Self-pay | Admitting: Obstetrics and Gynecology

## 2018-04-24 DIAGNOSIS — Z1231 Encounter for screening mammogram for malignant neoplasm of breast: Secondary | ICD-10-CM

## 2018-05-01 ENCOUNTER — Emergency Department (HOSPITAL_COMMUNITY)
Admission: EM | Admit: 2018-05-01 | Discharge: 2018-05-01 | Disposition: A | Discharge status: Home / Self Care | Payer: Self-pay | Attending: Emergency Medicine | Admitting: Emergency Medicine

## 2018-05-01 ENCOUNTER — Other Ambulatory Visit: Payer: Self-pay

## 2018-05-01 ENCOUNTER — Encounter (HOSPITAL_COMMUNITY): Payer: Self-pay | Admitting: Emergency Medicine

## 2018-05-01 DIAGNOSIS — Z87891 Personal history of nicotine dependence: Secondary | ICD-10-CM | POA: Insufficient documentation

## 2018-05-01 DIAGNOSIS — J019 Acute sinusitis, unspecified: Secondary | ICD-10-CM | POA: Insufficient documentation

## 2018-05-01 DIAGNOSIS — J029 Acute pharyngitis, unspecified: Secondary | ICD-10-CM | POA: Insufficient documentation

## 2018-05-01 DIAGNOSIS — I10 Essential (primary) hypertension: Secondary | ICD-10-CM | POA: Insufficient documentation

## 2018-05-01 MED ORDER — AZITHROMYCIN 250 MG PO TABS: ORAL_TABLET | ORAL | 0 refills | Status: DC

## 2018-05-01 MED ORDER — MAGIC MOUTHWASH W/LIDOCAINE: 5.0000 mL | Freq: Three times a day (TID) | ORAL | 0 refills | Status: DC | PRN

## 2018-05-01 MED ORDER — FLUTICASONE PROPIONATE 50 MCG/ACT NA SUSP: 2.0000 | Freq: Every day | NASAL | 2 refills | Status: DC

## 2018-05-01 NOTE — Discharge Instructions (Signed)
Take the antibiotic as directed until its finished.  Drink plenty of fluids.  Tylenol every 4 hours if needed for fever.  Return to the ER for any worsening symptoms.  [facility] Primary Care Doctor List    Sinda Du MD. Specialty: Pulmonary Disease Contact information: [address]  [place] [address]  [phone]   Tula Nakayama, MD. Specialty: [facility] Medicine Contact information: [address], [place] [address]  [phone]   Sallee Lange, MD. Specialty: [facility] Medicine Contact information: [place]  [place] [address]  [phone]   Rosita Fire, MD Specialty: Internal Medicine Contact information: [place] [address]  [phone]   Delphina Cahill, MD. Specialty: Internal Medicine Contact information: [place] [address]  [phone]    [facility] (Dr. Maudie Mercury) Specialty: Family Medicine Contact information: [place] [address]  [phone]   Leslie Andrea, MD. Specialty: [facility] Medicine Contact information: [place] [address]  [phone]   Asencion Noble, MD. Specialty: Internal Medicine Contact information: [address]  [place] [address]  [place]  [address] [place], [place] [address] [phone]  Services [facility] offers a variety of basic health services.  Services include but are not limited to: Blood pressure checks  Heart rate checks  Blood sugar checks  Urine analysis  Rapid strep tests  Pregnancy tests.  Health education and referrals  People needing more complex services will be directed to a physician online. Using these virtual visits, doctors can evaluate and prescribe medicine and treatments. There will be no medication on-site, though [facility] will help patients fill their prescriptions at little to no cost.   For More information please go to: [email or web address]

## 2018-05-01 NOTE — ED Triage Notes (Signed)
Patient complaining of sore throat, nasal congestion, and pain to face and head starting x 2 days.

## 2018-05-02 NOTE — ED Provider Notes (Signed)
[facility] EMERGENCY DEPARTMENT Provider Note   CSN: [id number] Arrival date & time: 05/01/18  1216     History   Chief Complaint Chief Complaint  Patient presents with  . Sore Throat    HPI Amy Cardenas is a 47 y.o. female.  HPI   Amy Cardenas is a 47 y.o. female who presents to the Emergency Department complaining of sore throat, nasal congestion, sinus pressure and pain.  Symptoms have been present for 2 days.  She describes a pressure-like pain behind both eyes and around her nose.  Pain is increased with bending over.  She also reports sore throat with swallowing and feels like she has drainage in the back of her throat.  She has not tried any over-the-counter medications.  She denies fever, chills, chest pain, cough and rash.  No known sick contacts.   Past Medical History:  Diagnosis Date  . Anemia   . Anxiety   . Essential hypertension   . Obesity     Patient Active Problem List   Diagnosis Date Noted  . Atypical chest pain 10/25/2014  . Essential hypertension 10/25/2014  . Cardiac murmur 10/25/2014  . Severe obesity (BMI >= 40) ([place]) 06/07/2013    Past Surgical History:  Procedure Laterality Date  . CESAREAN SECTION    . CESAREAN SECTION WITH BILATERAL TUBAL LIGATION  10/15/2012   Procedure: CESAREAN SECTION WITH BILATERAL TUBAL LIGATION;  Surgeon: Allyn Kenner, DO;  Location: [facility];  Service: Obstetrics;  Laterality: N/A;  Repeat  . TUBAL LIGATION    . WISDOM TOOTH EXTRACTION  1990     OB History    Gravida  2   Para  2   Term  2   Preterm      AB      Living  2     SAB      TAB      Ectopic      Multiple      Live Births  2            Home Medications    Prior to Admission medications   Medication Sig Start Date End Date Taking? Authorizing Provider  azithromycin (ZITHROMAX) 250 MG tablet Take first 2 tablets together, then 1 every day until finished. 05/01/18   Jaretzy Lhommedieu, PA-C  ciprofloxacin (CIPRO) 500 MG tablet  Take 1 tablet (500 mg total) by mouth 2 (two) times daily. Patient not taking: Reported on 01/29/2017 11/30/16   Jola Schmidt, MD  fluticasone [facility]) 50 MCG/ACT nasal spray Place 2 sprays into both nostrils daily. 05/01/18   Rhylen Pulido, PA-C  HYDROcodone-acetaminophen (NORCO/VICODIN) 5-325 MG tablet Take 1 tablet by mouth every 4 (four) hours as needed for moderate pain. Patient not taking: Reported on 01/29/2017 11/30/16   Jola Schmidt, MD  HYDROcodone-homatropine [facility]) 5-1.5 MG/5ML syrup Take 5 mLs by mouth every 6 (six) hours as needed. 01/30/17   Lily Kocher, PA-C  magic mouthwash w/lidocaine SOLN Take 5 mLs by mouth 3 (three) times daily as needed for mouth pain. Swish and spit, do not swallow 05/01/18   Kavish Lafitte, PA-C  metroNIDAZOLE (FLAGYL) 500 MG tablet Take 1 tablet (500 mg total) by mouth 2 (two) times daily. Patient not taking: Reported on 01/29/2017 11/30/16   Jola Schmidt, MD  polyethylene glycol powder [facility]) powder Take 17 g by mouth daily. Patient not taking: Reported on 01/29/2017 11/30/16   Jola Schmidt, MD    Family History Family History  Problem Relation  Age of Onset  . Diabetes Father   . Colon cancer Maternal Grandmother     Social History Social History   Tobacco Use  . Smoking status: Former Smoker    Packs/day: 0.15    Years: 3.00    Pack years: 0.45    Types: Cigarettes    Start date: 08/24/2007    Last attempt to quit: 09/23/2008    Years since quitting: 9.6  . Smokeless tobacco: Never Used  Substance Use Topics  . Alcohol use: No    Alcohol/week: 0.0 standard drinks  . Drug use: No     Allergies   Penicillins and Sulfa antibiotics   Review of Systems Review of Systems  Constitutional: Negative for activity change, appetite change, chills and fever.  HENT: Positive for congestion, rhinorrhea, sinus pressure, sinus pain and sore throat. Negative for facial swelling and trouble swallowing.   Eyes: Negative for visual disturbance.    Respiratory: Negative for cough, shortness of breath, wheezing and stridor.   Cardiovascular: Negative for chest pain.  Gastrointestinal: Negative for abdominal pain, nausea and vomiting.  Musculoskeletal: Negative for neck pain and neck stiffness.  Skin: Negative for rash.  Neurological: Negative for dizziness, weakness, numbness and headaches.  Hematological: Negative for adenopathy.  Psychiatric/Behavioral: Negative for confusion.  All other systems reviewed and are negative.    Physical Exam Updated Vital Signs BP (!) 152/99 (BP Location: Left Arm)   Pulse 97   Temp 98.3 F (36.8 C) (Oral)   Resp 18   Ht 5\' 1"  (1.549 m)   Wt 133.8 kg   LMP 04/29/2018   SpO2 99%   BMI 55.74 kg/m   Physical Exam  Constitutional: She appears well-developed and well-nourished. No distress.  HENT:  Head: Normocephalic and atraumatic.  Right Ear: Tympanic membrane and ear canal normal.  Left Ear: Tympanic membrane and ear canal normal.  Nose: Mucosal edema and rhinorrhea present. Right sinus exhibits frontal sinus tenderness. Left sinus exhibits frontal sinus tenderness.  Mouth/Throat: Uvula is midline, oropharynx is clear and moist and mucous membranes are normal. No trismus in the jaw. No uvula swelling. No oropharyngeal exudate, posterior oropharyngeal edema, posterior oropharyngeal erythema or tonsillar abscesses.  Eyes: Conjunctivae are normal.  Neck: Normal range of motion and phonation normal. Neck supple. No Brudzinski's sign and no Kernig's sign noted.  Cardiovascular: Normal rate, regular rhythm, normal heart sounds and intact distal pulses.  No murmur heard. Pulmonary/Chest: Effort normal and breath sounds normal. No respiratory distress. She has no wheezes. She has no rales.  Abdominal: Soft. She exhibits no distension. There is no tenderness. There is no rebound and no guarding.  Musculoskeletal: She exhibits no edema.  Lymphadenopathy:    She has no cervical adenopathy.   Neurological: She is alert. No sensory deficit.  Skin: Skin is warm. No rash noted.  Psychiatric: She has a normal mood and affect.  Nursing note and vitals reviewed.    ED Treatments / Results  Labs (all labs ordered are listed, but only abnormal results are displayed) Labs Reviewed - No data to display  EKG None  Radiology No results found.  Procedures Procedures (including critical care time)  Medications Ordered in ED Medications - No data to display   Initial Impression / Assessment and Plan / ED Course  I have reviewed the triage vital signs and the nursing notes.  Pertinent labs & imaging results that were available during my care of the patient were reviewed by me and considered in my  medical decision making (see chart for details).     Patient well-appearing.  Vital signs reviewed.  Symptoms are felt to be related to sinusitis.  No concerning symptoms for peritonsillar or retropharyngeal abscess.  Airway patent.  Patient agrees to treatment plan and PCP follow-up as needed.  Final Clinical Impressions(s) / ED Diagnoses   Final diagnoses:  Pharyngitis, unspecified etiology  Acute sinusitis, recurrence not specified, unspecified location    ED Discharge Orders         Ordered    azithromycin (ZITHROMAX) 250 MG tablet     05/01/18 1331    magic mouthwash w/lidocaine SOLN  3 times daily PRN     05/01/18 1331    fluticasone (FLONASE) 50 MCG/ACT nasal spray  Daily     05/01/18 1331           Kem Parkinson, PA-C 05/02/18 1751    Nat Christen, MD 05/03/18 1619

## 2018-05-12 ENCOUNTER — Ambulatory Visit: Payer: Medicaid Other | Admitting: Physician Assistant

## 2018-05-12 ENCOUNTER — Encounter: Payer: Self-pay | Admitting: Physician Assistant

## 2018-05-12 ENCOUNTER — Other Ambulatory Visit (HOSPITAL_COMMUNITY)
Admission: RE | Admit: 2018-05-12 | Discharge: 2018-05-12 | Disposition: A | Discharge status: Home / Self Care | Payer: Self-pay | Source: Ambulatory Visit | Attending: Physician Assistant | Admitting: Physician Assistant

## 2018-05-12 VITALS — BP 138/82 | HR 92 | Temp 97.9°F | Ht 62.5 in | Wt 306.0 lb

## 2018-05-12 DIAGNOSIS — D649 Anemia, unspecified: Secondary | ICD-10-CM

## 2018-05-12 DIAGNOSIS — R030 Elevated blood-pressure reading, without diagnosis of hypertension: Secondary | ICD-10-CM

## 2018-05-12 DIAGNOSIS — R7303 Prediabetes: Secondary | ICD-10-CM

## 2018-05-12 DIAGNOSIS — Z6841 Body Mass Index (BMI) 40.0 and over, adult: Secondary | ICD-10-CM

## 2018-05-12 DIAGNOSIS — Z13220 Encounter for screening for lipoid disorders: Secondary | ICD-10-CM

## 2018-05-12 DIAGNOSIS — Z7689 Persons encountering health services in other specified circumstances: Secondary | ICD-10-CM

## 2018-05-12 LAB — COMPREHENSIVE METABOLIC PANEL
ALK PHOS: 55 U/L (ref 38–126)
ALT: 15 U/L (ref 0–44)
ANION GAP: 6 (ref 5–15)
AST: 16 U/L (ref 15–41)
Albumin: 3.9 g/dL (ref 3.5–5.0)
BILIRUBIN TOTAL: 0.8 mg/dL (ref 0.3–1.2)
BUN: 13 mg/dL (ref 6–20)
CALCIUM: 9.4 mg/dL (ref 8.9–10.3)
CO2: 30 mmol/L (ref 22–32)
Chloride: 106 mmol/L (ref 98–111)
Creatinine, Ser: 0.87 mg/dL (ref 0.44–1.00)
GFR calc Af Amer: >60 mL/min (ref >60)
GLUCOSE: 98 mg/dL (ref 70–99)
POTASSIUM: 3.7 mmol/L (ref 3.5–5.1)
Sodium: 142 mmol/L (ref 135–145)
TOTAL PROTEIN: 7.8 g/dL (ref 6.5–8.1)

## 2018-05-12 LAB — LIPID PANEL
CHOL/HDL RATIO: 5.2 ratio
CHOLESTEROL: 165 mg/dL (ref 0–200)
HDL: 32 mg/dL — AB (ref >40)
LDL Cholesterol: 101 mg/dL — ABNORMAL HIGH (ref 0–99)
TRIGLYCERIDES: 161 mg/dL — AB (ref <150)
VLDL: 32 mg/dL (ref 0–40)

## 2018-05-12 LAB — CBC
HEMATOCRIT: 35.4 % — AB (ref 36.0–46.0)
HEMOGLOBIN: 10.6 g/dL — AB (ref 12.0–15.0)
MCH: 21.6 pg — ABNORMAL LOW (ref 26.0–34.0)
MCHC: 29.9 g/dL — AB (ref 30.0–36.0)
MCV: 72.1 fL — AB (ref 78.0–100.0)
Platelets: 407 10*3/uL — ABNORMAL HIGH (ref 150–400)
RBC: 4.91 MIL/uL (ref 3.87–5.11)
RDW: 16.9 % — AB (ref 11.5–15.5)
WBC: 8.5 10*3/uL (ref 4.0–10.5)

## 2018-05-12 LAB — HEMOGLOBIN A1C
Hgb A1c MFr Bld: 6.8 % — ABNORMAL HIGH (ref 4.8–5.6)
Mean Plasma Glucose: 148.46 mg/dL

## 2018-05-12 NOTE — Patient Instructions (Signed)
DASH Eating Plan DASH stands for "Dietary Approaches to Stop Hypertension." The DASH eating plan is a healthy eating plan that has been shown to reduce high blood pressure (hypertension). It may also reduce your risk for type 2 diabetes, heart disease, and stroke. The DASH eating plan may also help with weight loss. What are tips for following this plan? General guidelines  Avoid eating more than 2,300 mg (milligrams) of salt (sodium) a day. If you have hypertension, you may need to reduce your sodium intake to 1,500 mg a day.  Limit alcohol intake to no more than 1 drink a day for nonpregnant women and 2 drinks a day for men. One drink equals 12 oz of beer, 5 oz of wine, or 1 oz of hard liquor.  Work with your health care provider to maintain a healthy body weight or to lose weight. Ask what an ideal weight is for you.  Get at least 30 minutes of exercise that causes your heart to beat faster (aerobic exercise) most days of the week. Activities may include walking, swimming, or biking.  Work with your health care provider or diet and nutrition specialist (dietitian) to adjust your eating plan to your individual calorie needs. Reading food labels  Check food labels for the amount of sodium per serving. Choose foods with less than 5 percent of the Daily Value of sodium. Generally, foods with less than 300 mg of sodium per serving fit into this eating plan.  To find whole grains, look for the word "whole" as the first word in the ingredient list. Shopping  Buy products labeled as "low-sodium" or "no salt added."  Buy fresh foods. Avoid canned foods and premade or frozen meals. Cooking  Avoid adding salt when cooking. Use salt-free seasonings or herbs instead of table salt or sea salt. Check with your health care provider or pharmacist before using salt substitutes.  Do not fry foods. Cook foods using healthy methods such as baking, boiling, grilling, and broiling instead.  Cook with  heart-healthy oils, such as olive, canola, soybean, or sunflower oil. Meal planning   Eat a balanced diet that includes: ? 5 or more servings of fruits and vegetables each day. At each meal, try to fill half of your plate with fruits and vegetables. ? Up to 6-8 servings of whole grains each day. ? Less than 6 oz of lean meat, poultry, or fish each day. A 3-oz serving of meat is about the same size as a deck of cards. One egg equals 1 oz. ? 2 servings of low-fat dairy each day. ? A serving of nuts, seeds, or beans 5 times each week. ? Heart-healthy fats. Healthy fats called Omega-3 fatty acids are found in foods such as flaxseeds and coldwater fish, like sardines, salmon, and mackerel.  Limit how much you eat of the following: ? Canned or prepackaged foods. ? Food that is high in trans fat, such as fried foods. ? Food that is high in saturated fat, such as fatty meat. ? Sweets, desserts, sugary drinks, and other foods with added sugar. ? Full-fat dairy products.  Do not salt foods before eating.  Try to eat at least 2 vegetarian meals each week.  Eat more home-cooked food and less restaurant, buffet, and fast food.  When eating at a restaurant, ask that your food be prepared with less salt or no salt, if possible. What foods are recommended? The items listed may not be a complete list. Talk with your dietitian about what   dietary choices are best for you. Grains Whole-grain or whole-wheat bread. Whole-grain or whole-wheat pasta. Brown rice. Oatmeal. Quinoa. Bulgur. Whole-grain and low-sodium cereals. Pita bread. Low-fat, low-sodium crackers. Whole-wheat flour tortillas. Vegetables Fresh or frozen vegetables (raw, steamed, roasted, or grilled). Low-sodium or reduced-sodium tomato and vegetable juice. Low-sodium or reduced-sodium tomato sauce and tomato paste. Low-sodium or reduced-sodium canned vegetables. Fruits All fresh, dried, or frozen fruit. Canned fruit in natural juice (without  added sugar). Meat and other protein foods Skinless chicken or turkey. Ground chicken or turkey. Pork with fat trimmed off. Fish and seafood. Egg whites. Dried beans, peas, or lentils. Unsalted nuts, nut butters, and seeds. Unsalted canned beans. Lean cuts of beef with fat trimmed off. Low-sodium, lean deli meat. Dairy Low-fat (1%) or fat-free (skim) milk. Fat-free, low-fat, or reduced-fat cheeses. Nonfat, low-sodium ricotta or cottage cheese. Low-fat or nonfat yogurt. Low-fat, low-sodium cheese. Fats and oils Soft margarine without trans fats. Vegetable oil. Low-fat, reduced-fat, or light mayonnaise and salad dressings (reduced-sodium). Canola, safflower, olive, soybean, and sunflower oils. Avocado. Seasoning and other foods Herbs. Spices. Seasoning mixes without salt. Unsalted popcorn and pretzels. Fat-free sweets. What foods are not recommended? The items listed may not be a complete list. Talk with your dietitian about what dietary choices are best for you. Grains Baked goods made with fat, such as croissants, muffins, or some breads. Dry pasta or rice meal packs. Vegetables Creamed or fried vegetables. Vegetables in a cheese sauce. Regular canned vegetables (not low-sodium or reduced-sodium). Regular canned tomato sauce and paste (not low-sodium or reduced-sodium). Regular tomato and vegetable juice (not low-sodium or reduced-sodium). Pickles. Olives. Fruits Canned fruit in a light or heavy syrup. Fried fruit. Fruit in cream or butter sauce. Meat and other protein foods Fatty cuts of meat. Ribs. Fried meat. Bacon. Sausage. Bologna and other processed lunch meats. Salami. Fatback. Hotdogs. Bratwurst. Salted nuts and seeds. Canned beans with added salt. Canned or smoked fish. Whole eggs or egg yolks. Chicken or turkey with skin. Dairy Whole or 2% milk, cream, and half-and-half. Whole or full-fat cream cheese. Whole-fat or sweetened yogurt. Full-fat cheese. Nondairy creamers. Whipped toppings.  Processed cheese and cheese spreads. Fats and oils Butter. Stick margarine. Lard. Shortening. Ghee. Bacon fat. Tropical oils, such as coconut, palm kernel, or palm oil. Seasoning and other foods Salted popcorn and pretzels. Onion salt, garlic salt, seasoned salt, table salt, and sea salt. Worcestershire sauce. Tartar sauce. Barbecue sauce. Teriyaki sauce. Soy sauce, including reduced-sodium. Steak sauce. Canned and packaged gravies. Fish sauce. Oyster sauce. Cocktail sauce. Horseradish that you find on the shelf. Ketchup. Mustard. Meat flavorings and tenderizers. Bouillon cubes. Hot sauce and Tabasco sauce. Premade or packaged marinades. Premade or packaged taco seasonings. Relishes. Regular salad dressings. Where to find more information:  National Heart, Lung, and Blood Institute: www.nhlbi.nih.gov  American Heart Association: www.heart.org Summary  The DASH eating plan is a healthy eating plan that has been shown to reduce high blood pressure (hypertension). It may also reduce your risk for type 2 diabetes, heart disease, and stroke.  With the DASH eating plan, you should limit salt (sodium) intake to 2,300 mg a day. If you have hypertension, you may need to reduce your sodium intake to 1,500 mg a day.  When on the DASH eating plan, aim to eat more fresh fruits and vegetables, whole grains, lean proteins, low-fat dairy, and heart-healthy fats.  Work with your health care provider or diet and nutrition specialist (dietitian) to adjust your eating plan to your individual   calorie needs. This information is not intended to replace advice given to you by your health care provider. Make sure you discuss any questions you have with your health care provider. Document Released: 08/29/2011 Document Revised: 09/02/2016 Document Reviewed: 09/02/2016 Elsevier Interactive Patient Education  2018 Elsevier Inc.  

## 2018-05-12 NOTE — Progress Notes (Signed)
BP 138/82 (BP Location: Left Arm, Patient Position: Sitting, Cuff Size: Large)   Pulse 92   Temp 97.9 F (36.6 C)   Ht 5' 2.5" (1.588 m)   Wt (!) 306 lb (138.8 kg)   LMP 04/29/2018   SpO2 98%   BMI 55.08 kg/m    Subjective:    Patient ID: Amy Cardenas, female    DOB: 11/10/1970, 47 y.o.   MRN: [id number]  HPI: Amy R Bronkema is a 47 y.o. female presenting on 05/12/2018 for New Patient (Initial Visit) (previous pt at [facility] pt was last seen there 2 years ago. pt is concerned about her BP. pt states her BP was 162/90 last week when she went [facility])   HPI   Chief Complaint  Patient presents with  . New Patient (Initial Visit)    previous pt at [facility] pt was last seen there 2 years ago. pt is concerned about her BP. pt states her BP was 162/90 last week when she went [facility]    Pt says she has [place] medicaid.  Pt is in school at [employer] trying to get in to Resp therapy program  She says her daughter just started 45.  She also has a 76 year old son.    Relevant past medical, surgical, family and social history reviewed and updated as indicated. Interim medical history since our last visit reviewed. Allergies and medications reviewed and updated.   Current Outpatient Medications:  .  Cholecalciferol (VITAMIN D PO), Take 1 tablet by mouth daily., Disp: , Rfl:  .  fluticasone (FLONASE) 50 MCG/ACT nasal spray, Place 2 sprays into both nostrils daily., Disp: 10 g, Rfl: 2 .  IRON PO, Take 1 tablet by mouth daily., Disp: , Rfl:  .  Multiple Vitamins-Minerals (HAIR VITAMINS PO), Take 1 tablet by mouth daily., Disp: , Rfl:    Review of Systems  Constitutional: Positive for fatigue. Negative for appetite change, chills, diaphoresis, fever and unexpected weight change.  HENT: Negative for congestion, dental problem, drooling, ear pain, facial swelling, hearing loss, mouth sores, sneezing, sore throat, trouble swallowing and voice change.   Eyes: Negative for pain, discharge, redness, itching and  visual disturbance.  Respiratory: Positive for shortness of breath. Negative for cough, choking and wheezing.   Cardiovascular: Positive for palpitations. Negative for chest pain and leg swelling.  Gastrointestinal: Negative for abdominal pain, blood in stool, constipation, diarrhea and vomiting.  Endocrine: Negative for cold intolerance, heat intolerance and polydipsia.  Genitourinary: Negative for decreased urine volume, dysuria and hematuria.  Musculoskeletal: Negative for arthralgias, back pain and gait problem.  Skin: Negative for rash.  Allergic/Immunologic: Positive for environmental allergies.  Neurological: Negative for seizures, syncope, light-headedness and headaches.  Hematological: Negative for adenopathy.  Psychiatric/Behavioral: Negative for agitation, dysphoric mood and suicidal ideas. The patient is not nervous/anxious.     Per HPI unless specifically indicated above     Objective:    BP 138/82 (BP Location: Left Arm, Patient Position: Sitting, Cuff Size: Large)   Pulse 92   Temp 97.9 F (36.6 C)   Ht 5' 2.5" (1.588 m)   Wt (!) 306 lb (138.8 kg)   LMP 04/29/2018   SpO2 98%   BMI 55.08 kg/m   Wt Readings from Last 3 Encounters:  05/12/18 (!) 306 lb (138.8 kg)  05/01/18 295 lb (133.8 kg)  02/13/17 296 lb 3.2 oz (134.4 kg)    Physical Exam  Constitutional: She is oriented to person, place, and time. She appears  well-developed and well-nourished.  HENT:  Head: Normocephalic and atraumatic.  Mouth/Throat: Oropharynx is clear and moist. No oropharyngeal exudate.  Eyes: Pupils are equal, round, and reactive to light. Conjunctivae and EOM are normal.  Neck: Neck supple. No thyromegaly present.  Cardiovascular: Normal rate and regular rhythm.  Pulmonary/Chest: Effort normal and breath sounds normal.  Abdominal: Soft. Bowel sounds are normal. She exhibits no mass. There is no hepatosplenomegaly. There is no tenderness. A hernia is present. Hernia confirmed positive  in the ventral area.  Musculoskeletal: She exhibits no edema.  Lymphadenopathy:    She has no cervical adenopathy.  Neurological: She is alert and oriented to person, place, and time. Gait normal.  Skin: Skin is warm and dry.  Psychiatric: She has a normal mood and affect. Her behavior is normal.  Vitals reviewed.       Assessment & Plan:   Encounter Diagnoses  Name Primary?  . Encounter to establish care Yes  . Elevated blood pressure reading   . Screening cholesterol level   . Anemia, unspecified type   . Prediabetes      -will check baseline labs -pt is very motivated to start exercising and watching what she eats and losing weight.  Discussed lifestyle changes versus meidcaiton for the BP.  Pt would like to try lifestyle changes and recheck bp at follow up appointment -counseled pt about diet and exercise and gave hanout on DASH diet -pt to follow up 1 month.  RTO sooner prn

## 2018-05-22 ENCOUNTER — Other Ambulatory Visit: Payer: Self-pay

## 2018-05-22 ENCOUNTER — Encounter (HOSPITAL_COMMUNITY): Payer: Self-pay | Admitting: Emergency Medicine

## 2018-05-22 ENCOUNTER — Emergency Department (HOSPITAL_COMMUNITY)
Admission: EM | Admit: 2018-05-22 | Discharge: 2018-05-22 | Disposition: A | Discharge status: Home / Self Care | Payer: Self-pay | Attending: Emergency Medicine | Admitting: Emergency Medicine

## 2018-05-22 DIAGNOSIS — W57XXXA Bitten or stung by nonvenomous insect and other nonvenomous arthropods, initial encounter: Secondary | ICD-10-CM | POA: Insufficient documentation

## 2018-05-22 DIAGNOSIS — Y999 Unspecified external cause status: Secondary | ICD-10-CM | POA: Insufficient documentation

## 2018-05-22 DIAGNOSIS — Y939 Activity, unspecified: Secondary | ICD-10-CM | POA: Insufficient documentation

## 2018-05-22 DIAGNOSIS — I10 Essential (primary) hypertension: Secondary | ICD-10-CM | POA: Insufficient documentation

## 2018-05-22 DIAGNOSIS — Z79899 Other long term (current) drug therapy: Secondary | ICD-10-CM | POA: Insufficient documentation

## 2018-05-22 DIAGNOSIS — Z87891 Personal history of nicotine dependence: Secondary | ICD-10-CM | POA: Insufficient documentation

## 2018-05-22 DIAGNOSIS — Y92810 Car as the place of occurrence of the external cause: Secondary | ICD-10-CM | POA: Insufficient documentation

## 2018-05-22 DIAGNOSIS — S70362A Insect bite (nonvenomous), left thigh, initial encounter: Secondary | ICD-10-CM | POA: Insufficient documentation

## 2018-05-22 MED ORDER — DOXYCYCLINE HYCLATE 100 MG PO CAPS: 100.0000 mg | ORAL_CAPSULE | Freq: Two times a day (BID) | ORAL | 0 refills | Status: DC

## 2018-05-22 MED ORDER — ACETAMINOPHEN 325 MG PO TABS
650.0000 mg | ORAL_TABLET | Freq: Once | ORAL | Status: AC
  Administered 2018-05-22: 650 mg via ORAL
  Filled 2018-05-22: qty 2

## 2018-05-22 MED ORDER — DOXYCYCLINE HYCLATE 100 MG PO TABS
100.0000 mg | ORAL_TABLET | Freq: Once | ORAL | Status: AC
  Administered 2018-05-22: 100 mg via ORAL
  Filled 2018-05-22: qty 1

## 2018-05-22 NOTE — ED Notes (Signed)
Pt site dressed with telfa, secured with paper tape and tegaderm. Pt tolerated well. Pt educated on site management and cleaning regimen.

## 2018-05-22 NOTE — Discharge Instructions (Addendum)
As discussed, it appears you have an infected insect bite with cellulitis (a spreading superficial skin infection) which should improve with the antibiotics prescribed and continued warm soaks using epsom salt water twice daily as discussed.   Use tylenol or motrin for pain relief.  Return here if worsening or not improving once you have had 2 days of the antibiotic.

## 2018-05-22 NOTE — ED Triage Notes (Signed)
Pt states being bit by an insect.  Pt thinks its a spider.  C/o of drainage and swelling to left leg.

## 2018-05-22 NOTE — ED Provider Notes (Signed)
[facility] EMERGENCY DEPARTMENT Provider Note   CSN: [id number] Arrival date & time: 05/22/18  1318     History   Chief Complaint Chief Complaint  Patient presents with  . Insect Bite    HPI Amy Cardenas is a 47 y.o. female with no clinically significant past medical hx for current complaint, presents with pain, swelling and drainage from her left leg since was bit by an insect, suspected to be a spider although pt did not see the culprit insect.  Her symptoms started 2 days ago. She was sitting in her car when she was bit.  She has been applying cortisone cream to the site since there is some itching involved.  She denies fevers or chills.  She reports a blister which has popped and is now draining clear fluid. She reports pain but also itching.  The history is provided by the patient.    Past Medical History:  Diagnosis Date  . Anemia   . Anxiety   . Obesity     Patient Active Problem List   Diagnosis Date Noted  . Atypical chest pain 10/25/2014  . Essential hypertension 10/25/2014  . Cardiac murmur 10/25/2014  . Severe obesity (BMI >= 40) ([place]) 06/07/2013    Past Surgical History:  Procedure Laterality Date  . [place], 2014   x2  . CESAREAN SECTION WITH BILATERAL TUBAL LIGATION  10/15/2012   Procedure: CESAREAN SECTION WITH BILATERAL TUBAL LIGATION;  Surgeon: Allyn Kenner, DO;  Location: [facility];  Service: Obstetrics;  Laterality: N/A;  Repeat  . TUBAL LIGATION    . WISDOM TOOTH EXTRACTION  1990     OB History    Gravida  2   Para  2   Term  2   Preterm      AB      Living  2     SAB      TAB      Ectopic      Multiple      Live Births  2            Home Medications    Prior to Admission medications   Medication Sig Start Date End Date Taking? Authorizing Provider  Cholecalciferol (VITAMIN D PO) Take 1 tablet by mouth daily.    [provider]  doxycycline (VIBRAMYCIN) 100 MG capsule Take 1 capsule (100 mg  total) by mouth 2 (two) times daily. 05/22/18   Evalee Jefferson, PA-C  fluticasone (FLONASE) 50 MCG/ACT nasal spray Place 2 sprays into both nostrils daily. 05/01/18   Triplett, Tammy, PA-C  IRON PO Take 1 tablet by mouth daily.    [provider]  Multiple Vitamins-Minerals (HAIR VITAMINS PO) Take 1 tablet by mouth daily.    [provider]    Family History Family History  Problem Relation Age of Onset  . Diabetes Father   . Cancer Father        prostate and hx of colon  . Colon cancer Father   . Colon cancer Maternal Grandmother     Social History Social History   Tobacco Use  . Smoking status: Former Smoker    Packs/day: 0.15    Years: 3.00    Pack years: 0.45    Types: Cigarettes    Start date: 08/24/2007    Last attempt to quit: 09/23/2008    Years since quitting: 9.6  . Smokeless tobacco: Never Used  Substance Use Topics  . Alcohol use: No  Alcohol/week: 0.0 standard drinks  . Drug use: No     Allergies   Penicillins and Sulfa antibiotics   Review of Systems Review of Systems  Constitutional: Negative for chills and fever.  Respiratory: Negative for shortness of breath and wheezing.   Skin: Positive for color change and wound.  Neurological: Negative for numbness.     Physical Exam Updated Vital Signs BP (!) 147/73 (BP Location: Right Arm)   Pulse 80   Temp 98.1 F (36.7 C) (Oral)   Resp 16   Ht 5' 2.5" (1.588 m)   Wt (!) 138.8 kg   LMP 04/29/2018   SpO2 100%   BMI 55.08 kg/m   Physical Exam  Constitutional: She appears well-developed and well-nourished. No distress.  HENT:  Head: Normocephalic.  Neck: Neck supple.  Cardiovascular: Normal rate.  Pulmonary/Chest: Effort normal. She has no wheezes.  Musculoskeletal: Normal range of motion. She exhibits no edema.  Skin: No rash noted. There is erythema.  Denuded blister left lateral distal thigh with small central erythematous base draining a small amount of serous fluid, no  purulence and no fluctuance present.  There is a small area of surrounding induration and cellulitis extending 10 cm.  No red streaking.  No necrosis.      ED Treatments / Results  Labs (all labs ordered are listed, but only abnormal results are displayed) Labs Reviewed - No data to display  EKG None  Radiology No results found.  Procedures Procedures (including critical care time)  Medications Ordered in ED Medications  acetaminophen (TYLENOL) tablet 650 mg (650 mg Oral Given 05/22/18 1642)  doxycycline (VIBRA-TABS) tablet 100 mg (100 mg Oral Given 05/22/18 1642)     Initial Impression / Assessment and Plan / ED Course  I have reviewed the triage vital signs and the nursing notes.  Pertinent labs & imaging results that were available during my care of the patient were reviewed by me and considered in my medical decision making (see chart for details).     Probable insect bite with cellulitis but without abscess formation.  Discussed warm compresses, tylenol or motrin for pain relief, abx started.  Discussed return precautions.  Area of cellulitis was marked using marker pen. Discussed she may need I&D if abx and soaks do not improve this site.  She may also have some degree of localized histamine reaction given clear exudate and somewhat itchy sx, but will cover for cellulitis with abx.   Final Clinical Impressions(s) / ED Diagnoses   Final diagnoses:  Bug bite with infection, initial encounter    ED Discharge Orders         Ordered    doxycycline (VIBRAMYCIN) 100 MG capsule  2 times daily,   Status:  Discontinued     05/22/18 1627    doxycycline (VIBRAMYCIN) 100 MG capsule  2 times daily     05/22/18 1724           Evalee Jefferson, PA-C 05/22/18 Tse Bonito, [place], DO 05/22/18 2328

## 2018-05-26 ENCOUNTER — Ambulatory Visit: Payer: Medicaid Other | Admitting: Physician Assistant

## 2018-05-26 ENCOUNTER — Encounter: Payer: Self-pay | Admitting: Physician Assistant

## 2018-05-26 VITALS — BP 156/90 | HR 83 | Temp 97.9°F | Ht 62.5 in | Wt 315.5 lb

## 2018-05-26 DIAGNOSIS — S40261A Insect bite (nonvenomous) of right shoulder, initial encounter: Principal | ICD-10-CM

## 2018-05-26 DIAGNOSIS — S40861A Insect bite (nonvenomous) of right upper arm, initial encounter: Principal | ICD-10-CM

## 2018-05-26 DIAGNOSIS — E119 Type 2 diabetes mellitus without complications: Secondary | ICD-10-CM

## 2018-05-26 DIAGNOSIS — D649 Anemia, unspecified: Secondary | ICD-10-CM | POA: Insufficient documentation

## 2018-05-26 DIAGNOSIS — W57XXXA Bitten or stung by nonvenomous insect and other nonvenomous arthropods, initial encounter: Principal | ICD-10-CM

## 2018-05-26 DIAGNOSIS — L089 Local infection of the skin and subcutaneous tissue, unspecified: Secondary | ICD-10-CM

## 2018-05-26 DIAGNOSIS — I10 Essential (primary) hypertension: Secondary | ICD-10-CM

## 2018-05-26 HISTORY — DX: Type 2 diabetes mellitus without complications: E11.9

## 2018-05-26 MED ORDER — METFORMIN HCL ER 500 MG PO TB24: 500.0000 mg | ORAL_TABLET | Freq: Every day | ORAL | 3 refills | Status: DC

## 2018-05-26 MED ORDER — LISINOPRIL 10 MG PO TABS: 10.0000 mg | ORAL_TABLET | Freq: Every day | ORAL | 3 refills | Status: DC

## 2018-05-26 MED ORDER — TRIAMCINOLONE ACETONIDE 0.025 % EX OINT: 1.0000 "application " | TOPICAL_OINTMENT | Freq: Two times a day (BID) | CUTANEOUS | 0 refills | Status: DC | PRN

## 2018-05-26 NOTE — Patient Instructions (Signed)
Diabetes Mellitus and Nutrition When you have diabetes (diabetes mellitus), it is very important to have healthy eating habits because your blood sugar (glucose) levels are greatly affected by what you eat and drink. Eating healthy foods in the appropriate amounts, at about the same times every day, can help you:  Control your blood glucose.  Lower your risk of heart disease.  Improve your blood pressure.  Reach or maintain a healthy weight.  Every person with diabetes is different, and each person has different needs for a meal plan. Your health care provider may recommend that you work with a diet and nutrition specialist (dietitian) to make a meal plan that is best for you. Your meal plan may vary depending on factors such as:  The calories you need.  The medicines you take.  Your weight.  Your blood glucose, blood pressure, and cholesterol levels.  Your activity level.  Other health conditions you have, such as heart or kidney disease.  How do carbohydrates affect me? Carbohydrates affect your blood glucose level more than any other type of food. Eating carbohydrates naturally increases the amount of glucose in your blood. Carbohydrate counting is a method for keeping track of how many carbohydrates you eat. Counting carbohydrates is important to keep your blood glucose at a healthy level, especially if you use insulin or take certain oral diabetes medicines. It is important to know how many carbohydrates you can safely have in each meal. This is different for every person. Your dietitian can help you calculate how many carbohydrates you should have at each meal and for snack. Foods that contain carbohydrates include:  Bread, cereal, rice, pasta, and crackers.  Potatoes and corn.  Peas, beans, and lentils.  Milk and yogurt.  Fruit and juice.  Desserts, such as cakes, cookies, ice cream, and candy.  How does alcohol affect me? Alcohol can cause a sudden decrease in blood  glucose (hypoglycemia), especially if you use insulin or take certain oral diabetes medicines. Hypoglycemia can be a life-threatening condition. Symptoms of hypoglycemia (sleepiness, dizziness, and confusion) are similar to symptoms of having too much alcohol. If your health care provider says that alcohol is safe for you, follow these guidelines:  Limit alcohol intake to no more than 1 drink per day for nonpregnant women and 2 drinks per day for men. One drink equals 12 oz of beer, 5 oz of wine, or 1 oz of hard liquor.  Do not drink on an empty stomach.  Keep yourself hydrated with water, diet soda, or unsweetened iced tea.  Keep in mind that regular soda, juice, and other mixers may contain a lot of sugar and must be counted as carbohydrates.  What are tips for following this plan? Reading food labels  Start by checking the serving size on the label. The amount of calories, carbohydrates, fats, and other nutrients listed on the label are based on one serving of the food. Many foods contain more than one serving per package.  Check the total grams (g) of carbohydrates in one serving. You can calculate the number of servings of carbohydrates in one serving by dividing the total carbohydrates by 15. For example, if a food has 30 g of total carbohydrates, it would be equal to 2 servings of carbohydrates.  Check the number of grams (g) of saturated and trans fats in one serving. Choose foods that have low or no amount of these fats.  Check the number of milligrams (mg) of sodium in one serving. Most people   should limit total sodium intake to less than 2,300 mg per day.  Always check the nutrition information of foods labeled as "low-fat" or "nonfat". These foods may be higher in added sugar or refined carbohydrates and should be avoided.  Talk to your dietitian to identify your daily goals for nutrients listed on the label. Shopping  Avoid buying canned, premade, or processed foods. These  foods tend to be high in fat, sodium, and added sugar.  Shop around the outside edge of the grocery store. This includes fresh fruits and vegetables, bulk grains, fresh meats, and fresh dairy. Cooking  Use low-heat cooking methods, such as baking, instead of high-heat cooking methods like deep frying.  Cook using healthy oils, such as olive, canola, or sunflower oil.  Avoid cooking with butter, cream, or high-fat meats. Meal planning  Eat meals and snacks regularly, preferably at the same times every day. Avoid going long periods of time without eating.  Eat foods high in fiber, such as fresh fruits, vegetables, beans, and whole grains. Talk to your dietitian about how many servings of carbohydrates you can eat at each meal.  Eat 4-6 ounces of lean protein each day, such as lean meat, chicken, fish, eggs, or tofu. 1 ounce is equal to 1 ounce of meat, chicken, or fish, 1 egg, or 1/4 cup of tofu.  Eat some foods each day that contain healthy fats, such as avocado, nuts, seeds, and fish. Lifestyle   Check your blood glucose regularly.  Exercise at least 30 minutes 5 or more days each week, or as told by your health care provider.  Take medicines as told by your health care provider.  Do not use any products that contain nicotine or tobacco, such as cigarettes and e-cigarettes. If you need help quitting, ask your health care provider.  Work with a counselor or diabetes educator to identify strategies to manage stress and any emotional and social challenges. What are some questions to ask my health care provider?  Do I need to meet with a diabetes educator?  Do I need to meet with a dietitian?  What number can I call if I have questions?  When are the best times to check my blood glucose? Where to find more information:  American Diabetes Association: diabetes.org/food-and-fitness/food  Academy of Nutrition and Dietetics:  www.eatright.org/resources/health/diseases-and-conditions/diabetes  National Institute of Diabetes and Digestive and Kidney Diseases (NIH): www.niddk.nih.gov/health-information/diabetes/overview/diet-eating-physical-activity Summary  A healthy meal plan will help you control your blood glucose and maintain a healthy lifestyle.  Working with a diet and nutrition specialist (dietitian) can help you make a meal plan that is best for you.  Keep in mind that carbohydrates and alcohol have immediate effects on your blood glucose levels. It is important to count carbohydrates and to use alcohol carefully. This information is not intended to replace advice given to you by your health care provider. Make sure you discuss any questions you have with your health care provider. Document Released: 06/06/2005 Document Revised: 10/14/2016 Document Reviewed: 10/14/2016 Elsevier Interactive Patient Education  2018 Elsevier Inc.  

## 2018-05-26 NOTE — Progress Notes (Signed)
BP (!) 156/90 (BP Location: Left Wrist, Patient Position: Sitting, Cuff Size: Normal)   Pulse 83   Temp 97.9 F (36.6 C) (Other (Comment))   Ht 5' 2.5" (1.588 m)   Wt (!) 315 lb 8 oz (143.1 kg)   LMP 04/29/2018   SpO2 100%   BMI 56.79 kg/m    Subjective:    Patient ID: Amy Cardenas, female    DOB: 03-Jan-1971, 47 y.o.   MRN: [id number]  HPI: Amy R Houser is a 47 y.o. female presenting on 05/26/2018 for Insect Bite ("went to [facility] ER Friday due to ? insect bite on left leg, yesterday bite on right arm started yesterday, used Epson salt and warm water as instucted, now bites on arm are blistered)   HPI   Chief Complaint  Patient presents with  . Insect Bite    "went to [facility] ER Friday due to ? insect bite on left leg, yesterday bite on right arm started yesterday, used Epson salt and warm water as instucted, now bites on arm are blistered    Pt says bite on leg is improving on the doxycycline.  She says she just got bitten on the arm  Relevant past medical, surgical, family and social history reviewed and updated as indicated. Interim medical history since our last visit reviewed. Allergies and medications reviewed and updated.   Current Outpatient Medications:  .  Cholecalciferol (VITAMIN D PO), Take 1 tablet by mouth daily., Disp: , Rfl:  .  doxycycline (VIBRAMYCIN) 100 MG capsule, Take 1 capsule (100 mg total) by mouth 2 (two) times daily., Disp: 20 capsule, Rfl: 0 .  fluticasone (FLONASE) 50 MCG/ACT nasal spray, Place 2 sprays into both nostrils daily., Disp: 10 g, Rfl: 2 .  IRON PO, Take 1 tablet by mouth daily., Disp: , Rfl:  .  Multiple Vitamins-Minerals (HAIR VITAMINS PO), Take 1 tablet by mouth daily., Disp: , Rfl:   Review of Systems  Constitutional: Negative for appetite change, chills, diaphoresis, fatigue, fever and unexpected weight change.  HENT: Negative for congestion, dental problem, drooling, ear pain, facial swelling, hearing loss, mouth sores, sneezing, sore  throat, trouble swallowing and voice change.   Eyes: Negative for pain, discharge, redness, itching and visual disturbance.  Respiratory: Negative for cough, choking, shortness of breath and wheezing.   Cardiovascular: Negative for chest pain, palpitations and leg swelling.  Gastrointestinal: Negative for abdominal pain, blood in stool, constipation, diarrhea and vomiting.  Endocrine: Negative for cold intolerance, heat intolerance and polydipsia.  Genitourinary: Negative for decreased urine volume, dysuria and hematuria.  Musculoskeletal: Negative for arthralgias, back pain and gait problem.  Skin: Negative for rash.  Allergic/Immunologic: Negative for environmental allergies.  Neurological: Negative for seizures, syncope, light-headedness and headaches.  Hematological: Negative for adenopathy.  Psychiatric/Behavioral: Negative for agitation, dysphoric mood and suicidal ideas. The patient is not nervous/anxious.     Per HPI unless specifically indicated above     Objective:    BP (!) 156/90 (BP Location: Left Wrist, Patient Position: Sitting, Cuff Size: Normal)   Pulse 83   Temp 97.9 F (36.6 C) (Other (Comment))   Ht 5' 2.5" (1.588 m)   Wt (!) 315 lb 8 oz (143.1 kg)   LMP 04/29/2018   SpO2 100%   BMI 56.79 kg/m   Wt Readings from Last 3 Encounters:  05/26/18 (!) 315 lb 8 oz (143.1 kg)  05/22/18 (!) 306 lb (138.8 kg)  05/12/18 (!) 306 lb (138.8 kg)    Physical  Exam  Constitutional: She is oriented to person, place, and time. She appears well-developed and well-nourished.  HENT:  Head: Normocephalic and atraumatic.  Neck: Neck supple.  Cardiovascular: Normal rate and regular rhythm.  Pulmonary/Chest: Effort normal and breath sounds normal.  Abdominal: Soft. Bowel sounds are normal. She exhibits no mass. There is no hepatosplenomegaly. There is no tenderness.  Musculoskeletal: She exhibits no edema.       Arms:      Legs: Wound left thigh improved- appears to be healing-  no purulence, nontender.  Surrounding tissue not red or swelled.  Posterior R upper arm swelled and red with several vesicular areas, 1 area is open (see picture)  Lymphadenopathy:    She has no cervical adenopathy.  Neurological: She is alert and oriented to person, place, and time.  Skin: Skin is warm and dry.  Psychiatric: She has a normal mood and affect. Her behavior is normal.  Vitals reviewed.         Results for orders placed or performed during the hospital encounter of 05/12/18  Lipid panel  Result Value Ref Range   Cholesterol 165 0 - 200 mg/dL   Triglycerides 161 (H) <150 mg/dL   HDL 32 (L) >40 mg/dL   Total CHOL/HDL Ratio 5.2 RATIO   VLDL 32 0 - 40 mg/dL   LDL Cholesterol 101 (H) 0 - 99 mg/dL  Hemoglobin A1c  Result Value Ref Range   Hgb A1c MFr Bld 6.8 (H) 4.8 - 5.6 %   Mean Plasma Glucose 148.46 mg/dL  CBC  Result Value Ref Range   WBC 8.5 4.0 - 10.5 K/uL   RBC 4.91 3.87 - 5.11 MIL/uL   Hemoglobin 10.6 (L) 12.0 - 15.0 g/dL   HCT 35.4 (L) 36.0 - 46.0 %   MCV 72.1 (L) 78.0 - 100.0 fL   MCH 21.6 (L) 26.0 - 34.0 pg   MCHC 29.9 (L) 30.0 - 36.0 g/dL   RDW 16.9 (H) 11.5 - 15.5 %   Platelets 407 (H) 150 - 400 K/uL  Comprehensive metabolic panel  Result Value Ref Range   Sodium 142 135 - 145 mmol/L   Potassium 3.7 3.5 - 5.1 mmol/L   Chloride 106 98 - 111 mmol/L   CO2 30 22 - 32 mmol/L   Glucose, Bld 98 70 - 99 mg/dL   BUN 13 6 - 20 mg/dL   Creatinine, Ser 0.87 0.44 - 1.00 mg/dL   Calcium 9.4 8.9 - 10.3 mg/dL   Total Protein 7.8 6.5 - 8.1 g/dL   Albumin 3.9 3.5 - 5.0 g/dL   AST 16 15 - 41 U/L   ALT 15 0 - 44 U/L   Alkaline Phosphatase 55 38 - 126 U/L   Total Bilirubin 0.8 0.3 - 1.2 mg/dL   GFR calc non Af Amer >60 >60 mL/min   GFR calc Af Amer >60 >60 mL/min   Anion gap 6 5 - 15      Assessment & Plan:   Encounter Diagnoses  Name Primary?  . Bite, nonvenomous insect, shoulder or upper arm with infection, right, initial encounter Yes  . Essential  hypertension   . Diabetes mellitus without complication ([place])   . Anemia, unspecified type   . Morbid obesity ([place])     -reviewed labs with pt -Pt to continue iron pills for anemia  -rx metformin for diabetes and counseled on diabetic diet.  Gave reading information -rx lisinopril for blood pressure -counseled on weight loss to help DM and  htn -pt to continue doxycycline which should help elbow as well as thigh.  Gave mild TAC ointment to help with itching -pt to follow up 1 month to recheck blood pressure.  She is to RTO sooner for any worsening of the arm or for other problems

## 2018-05-28 ENCOUNTER — Encounter (HOSPITAL_COMMUNITY): Payer: Self-pay

## 2018-05-28 ENCOUNTER — Emergency Department (HOSPITAL_COMMUNITY)
Admission: EM | Admit: 2018-05-28 | Discharge: 2018-05-29 | Disposition: A | Discharge status: Home / Self Care | Payer: Self-pay | Attending: Emergency Medicine | Admitting: Emergency Medicine

## 2018-05-28 ENCOUNTER — Other Ambulatory Visit: Payer: Self-pay

## 2018-05-28 DIAGNOSIS — Z7984 Long term (current) use of oral hypoglycemic drugs: Secondary | ICD-10-CM | POA: Insufficient documentation

## 2018-05-28 DIAGNOSIS — S80861A Insect bite (nonvenomous), right lower leg, initial encounter: Secondary | ICD-10-CM | POA: Insufficient documentation

## 2018-05-28 DIAGNOSIS — Z87891 Personal history of nicotine dependence: Secondary | ICD-10-CM | POA: Insufficient documentation

## 2018-05-28 DIAGNOSIS — E119 Type 2 diabetes mellitus without complications: Secondary | ICD-10-CM | POA: Insufficient documentation

## 2018-05-28 DIAGNOSIS — Y998 Other external cause status: Secondary | ICD-10-CM | POA: Insufficient documentation

## 2018-05-28 DIAGNOSIS — I10 Essential (primary) hypertension: Secondary | ICD-10-CM | POA: Insufficient documentation

## 2018-05-28 DIAGNOSIS — Z79899 Other long term (current) drug therapy: Secondary | ICD-10-CM | POA: Insufficient documentation

## 2018-05-28 DIAGNOSIS — Y929 Unspecified place or not applicable: Secondary | ICD-10-CM | POA: Insufficient documentation

## 2018-05-28 DIAGNOSIS — Y939 Activity, unspecified: Secondary | ICD-10-CM | POA: Insufficient documentation

## 2018-05-28 DIAGNOSIS — W57XXXA Bitten or stung by nonvenomous insect and other nonvenomous arthropods, initial encounter: Secondary | ICD-10-CM | POA: Insufficient documentation

## 2018-05-28 NOTE — ED Triage Notes (Signed)
Pt has notable swelling and redness to right distal leg with blisters, states she believes they are insect bites that bit her last Friday but blisters and redness did not appear until today. There is also smaller bites/lacerations to proximal right upper arm.

## 2018-05-29 LAB — CBC WITH DIFFERENTIAL/PLATELET
BASOS ABS: 0 10*3/uL (ref 0.0–0.1)
BASOS PCT: 0 %
Eosinophils Absolute: 0.3 10*3/uL (ref 0.0–0.7)
Eosinophils Relative: 3 %
HEMATOCRIT: 33.4 % — AB (ref 36.0–46.0)
HEMOGLOBIN: 10.1 g/dL — AB (ref 12.0–15.0)
LYMPHS PCT: 22 %
Lymphs Abs: 2.1 10*3/uL (ref 0.7–4.0)
MCH: 21.7 pg — ABNORMAL LOW (ref 26.0–34.0)
MCHC: 30.2 g/dL (ref 30.0–36.0)
MCV: 71.7 fL — AB (ref 78.0–100.0)
MONO ABS: 0.6 10*3/uL (ref 0.1–1.0)
MONOS PCT: 6 %
NEUTROS ABS: 6.7 10*3/uL (ref 1.7–7.7)
NEUTROS PCT: 69 %
Platelets: 364 10*3/uL (ref 150–400)
RBC: 4.66 MIL/uL (ref 3.87–5.11)
RDW: 17.3 % — AB (ref 11.5–15.5)
WBC: 9.7 10*3/uL (ref 4.0–10.5)

## 2018-05-29 LAB — BASIC METABOLIC PANEL
ANION GAP: 12 (ref 5–15)
BUN: 14 mg/dL (ref 6–20)
CALCIUM: 9.7 mg/dL (ref 8.9–10.3)
CO2: 25 mmol/L (ref 22–32)
Chloride: 103 mmol/L (ref 98–111)
Creatinine, Ser: 0.98 mg/dL (ref 0.44–1.00)
GFR calc non Af Amer: >60 mL/min (ref >60)
GLUCOSE: 138 mg/dL — AB (ref 70–99)
Potassium: 3.5 mmol/L (ref 3.5–5.1)
Sodium: 140 mmol/L (ref 135–145)

## 2018-05-29 MED ORDER — CLINDAMYCIN PHOSPHATE 600 MG/50ML IV SOLN
600.0000 mg | Freq: Once | INTRAVENOUS | Status: AC
  Administered 2018-05-29: 600 mg via INTRAVENOUS
  Filled 2018-05-29: qty 50

## 2018-05-29 MED ORDER — CLINDAMYCIN HCL 300 MG PO CAPS: 300.0000 mg | ORAL_CAPSULE | Freq: Three times a day (TID) | ORAL | 0 refills | Status: DC

## 2018-05-29 MED ORDER — DIPHENHYDRAMINE HCL 50 MG/ML IJ SOLN
25.0000 mg | Freq: Once | INTRAMUSCULAR | Status: AC
  Administered 2018-05-29: 25 mg via INTRAVENOUS
  Filled 2018-05-29: qty 1

## 2018-05-29 MED ORDER — DEXAMETHASONE SODIUM PHOSPHATE 10 MG/ML IJ SOLN
10.0000 mg | Freq: Once | INTRAMUSCULAR | Status: AC
  Administered 2018-05-29: 10 mg via INTRAVENOUS
  Filled 2018-05-29: qty 1

## 2018-05-29 MED ORDER — ACETAMINOPHEN 325 MG PO TABS
650.0000 mg | ORAL_TABLET | Freq: Once | ORAL | Status: AC
  Administered 2018-05-29: 650 mg via ORAL
  Filled 2018-05-29: qty 2

## 2018-05-29 NOTE — ED Notes (Signed)
Pt ambulatory to waiting room. Pt verbalized understanding of discharge instructions.   

## 2018-05-29 NOTE — Discharge Instructions (Addendum)
Benadryl by mouth every 4-6 hrs as needed for itching You received a steroid that will last 48 hours Please be seen in 48 hrs if no improvement You can return earlier if it is worse in next 48 hrs You can stop doxycycline

## 2018-05-29 NOTE — ED Provider Notes (Signed)
[facility] EMERGENCY DEPARTMENT Provider Note   CSN: [id number] Arrival date & time: 05/28/18  2226     History   Chief Complaint Chief Complaint  Patient presents with  . Insect Bite    HPI Amy Cardenas is a 47 y.o. female.  The history is provided by the patient.  Rash   This is a new problem. The current episode started 6 to 12 hours ago. The problem has been gradually worsening. There has been no fever. The rash is present on the right lower leg. The pain is severe. The pain has been constant since onset. Associated symptoms include blisters, itching and pain. She has tried anti-itch cream and antihistamines for the symptoms. The treatment provided no relief.   PT With history of obesity, diabetes presents with pain and redness to right leg. She reports recent "bites" to multiple extremities and large reactions to those.  Today she reports another bite to her right leg with blisters and pain. No fever/vomiting.  No chest pain or shortness of breath.  No other rash reported.  No difficulty swallowing or facial or tongue swelling  She reports she has had exaggerated reactions to mosquito bites in the past. She reports recently she had similar reactions to her arms, and has been placed on doxycycline with some improvement Past Medical History:  Diagnosis Date  . Anemia   . Anxiety   . Diabetes mellitus without complication ([place]) 0/05/2329  . Obesity     Patient Active Problem List   Diagnosis Date Noted  . Diabetes mellitus without complication ([place]) 07/62/2633  . Anemia 05/26/2018  . Atypical chest pain 10/25/2014  . Essential hypertension 10/25/2014  . Cardiac murmur 10/25/2014  . Severe obesity (BMI >= 40) ([place]) 06/07/2013    Past Surgical History:  Procedure Laterality Date  . [place], 2014   x2  . CESAREAN SECTION WITH BILATERAL TUBAL LIGATION  10/15/2012   Procedure: CESAREAN SECTION WITH BILATERAL TUBAL LIGATION;  Surgeon: Allyn Kenner, DO;   Location: [facility];  Service: Obstetrics;  Laterality: N/A;  Repeat  . TUBAL LIGATION    . WISDOM TOOTH EXTRACTION  1990     OB History    Gravida  2   Para  2   Term  2   Preterm      AB      Living  2     SAB      TAB      Ectopic      Multiple      Live Births  2            Home Medications    Prior to Admission medications   Medication Sig Start Date End Date Taking? Authorizing Provider  Cholecalciferol (VITAMIN D PO) Take 1 tablet by mouth daily.    [provider]  doxycycline (VIBRAMYCIN) 100 MG capsule Take 1 capsule (100 mg total) by mouth 2 (two) times daily. 05/22/18   Evalee Jefferson, PA-C  fluticasone (FLONASE) 50 MCG/ACT nasal spray Place 2 sprays into both nostrils daily. 05/01/18   Triplett, Tammy, PA-C  IRON PO Take 1 tablet by mouth daily.    [provider]  lisinopril (PRINIVIL,ZESTRIL) 10 MG tablet Take 1 tablet (10 mg total) by mouth daily. 05/26/18   [name], PA-C  metFORMIN (GLUCOPHAGE XR) 500 MG 24 hr tablet Take 1 tablet (500 mg total) by mouth daily with breakfast. 05/26/18   [name], PA-C  Multiple Vitamins-Minerals (HAIR  VITAMINS PO) Take 1 tablet by mouth daily.    [provider]  triamcinolone (KENALOG) 0.025 % ointment Apply 1 application topically 2 (two) times daily as needed. 05/26/18   [name], PA-C    Family History Family History  Problem Relation Age of Onset  . Diabetes Father   . Cancer Father        prostate and hx of colon  . Colon cancer Father   . Colon cancer Maternal Grandmother     Social History Social History   Tobacco Use  . Smoking status: Former Smoker    Packs/day: 0.15    Years: 3.00    Pack years: 0.45    Types: Cigarettes    Start date: 08/24/2007    Last attempt to quit: 09/23/2008    Years since quitting: 9.6  . Smokeless tobacco: Never Used  Substance Use Topics  . Alcohol use: No    Alcohol/week: 0.0 standard drinks  . Drug use: No      Allergies   Penicillins and Sulfa antibiotics   Review of Systems Review of Systems  Constitutional: Negative for fever.  Respiratory: Negative for shortness of breath.   Cardiovascular: Negative for chest pain.  Skin: Positive for itching and rash.  All other systems reviewed and are negative.    Physical Exam Updated Vital Signs BP (!) 172/99 (BP Location: Right Arm)   Pulse 95   Temp 98.7 F (37.1 C) (Oral)   Resp 15   Ht 1.562 m (5' 1.5")   Wt (!) 138.8 kg   LMP 04/29/2018   SpO2 99%   BMI 56.88 kg/m   Physical Exam CONSTITUTIONAL: Well developed/well nourished HEAD: Normocephalic/atraumatic EYES: EOMI/PERRL ENMT: Mucous membranes moist, no angioedema NECK: supple no meningeal signs SPINE/BACK:entire spine nontender CV: S1/S2 noted, no murmurs/rubs/gallops noted LUNGS: Lungs are clear to auscultation bilaterally, no apparent distress ABDOMEN: soft, nontender, no rebound or guarding, bowel sounds noted throughout abdomen GU:no cva tenderness NEURO: Pt is awake/alert/appropriate, moves all extremitiesx4.  No facial droop.   EXTREMITIES: pulses normal/equal, full ROM No crepitus to right leg.  There is erythema and warmth to right leg.  Distal pulses intact.  No streaking proximally to the knee SKIN: warm, color normal PSYCH: no abnormalities of mood noted, alert and oriented to situation    Patient gave verbal permission to utilize photo for medical documentation only The image was not stored on any personal device ED Treatments / Results  Labs (all labs ordered are listed, but only abnormal results are displayed) Labs Reviewed  BASIC METABOLIC PANEL - Abnormal; Notable for the following components:      Result Value   Glucose, Bld 138 (*)    All other components within normal limits  CBC WITH DIFFERENTIAL/PLATELET - Abnormal; Notable for the following components:   Hemoglobin 10.1 (*)    HCT 33.4 (*)    MCV 71.7 (*)    MCH 21.7 (*)    RDW 17.3  (*)    All other components within normal limits    EKG None  Radiology No results found.  Procedures Procedures  Medications Ordered in ED Medications  clindamycin (CLEOCIN) IVPB 600 mg (0 mg Intravenous Stopped 05/29/18 0151)  diphenhydrAMINE (BENADRYL) injection 25 mg (25 mg Intravenous Given 05/29/18 0044)  acetaminophen (TYLENOL) tablet 650 mg (650 mg Oral Given 05/29/18 0043)  dexamethasone (DECADRON) injection 10 mg (10 mg Intravenous Given 05/29/18 0201)     Initial Impression / Assessment and Plan / ED Course  I have reviewed the triage vital signs and the nursing notes.  Pertinent labs & imaging results that were available during my care of the patient were reviewed by me and considered in my medical decision making (see chart for details).     12:41 AM Patient has repeat visit for reaction or infection to right leg.  She had other similar lesions to her arms, but this is worse.  She thinks it only occurs in her car, and she distinctly feels stings each time. No  Family members report similar history  she reports history of mosquito bites in the past with exaggerated reactions.  This could be localized reaction to mosquito bite.  Though difficult to rule out cellulitis.  Labs been ordered at this time.    Labs reassuring.  Patient feels improved. She reports all the symptoms began in the previous 12 hours. Cellulitis is possible, therefore we will have her stop doxy and start clindamycin since she has been on Doxy and unclear if it has improved anything.  However I do believe this is likely due to to a reaction.  Steroids and Benadryl were given.  Advised to continue Benadryl at home. Asked her to monitor her symptoms, and if any worsening next 48 hours she should return for reevaluation. Final Clinical Impressions(s) / ED Diagnoses   Final diagnoses:  Insect bite of right lower leg, initial encounter    ED Discharge Orders         Ordered    clindamycin (CLEOCIN) 300  MG capsule  3 times daily     05/29/18 0153           Ripley Fraise, MD 05/29/18 0502

## 2018-06-09 ENCOUNTER — Ambulatory Visit: Payer: Self-pay | Admitting: Physician Assistant

## 2018-06-22 ENCOUNTER — Ambulatory Visit: Payer: Self-pay | Admitting: Physician Assistant

## 2018-06-25 ENCOUNTER — Encounter: Payer: Self-pay | Admitting: Physician Assistant

## 2018-06-25 ENCOUNTER — Ambulatory Visit: Payer: Medicaid Other | Admitting: Physician Assistant

## 2018-06-25 VITALS — BP 146/80 | HR 73 | Temp 97.9°F | Ht 62.5 in | Wt 300.5 lb

## 2018-06-25 DIAGNOSIS — D649 Anemia, unspecified: Secondary | ICD-10-CM

## 2018-06-25 DIAGNOSIS — S40261A Insect bite (nonvenomous) of right shoulder, initial encounter: Secondary | ICD-10-CM

## 2018-06-25 DIAGNOSIS — S40861A Insect bite (nonvenomous) of right upper arm, initial encounter: Secondary | ICD-10-CM

## 2018-06-25 DIAGNOSIS — I10 Essential (primary) hypertension: Secondary | ICD-10-CM

## 2018-06-25 DIAGNOSIS — W57XXXA Bitten or stung by nonvenomous insect and other nonvenomous arthropods, initial encounter: Secondary | ICD-10-CM

## 2018-06-25 DIAGNOSIS — L089 Local infection of the skin and subcutaneous tissue, unspecified: Secondary | ICD-10-CM

## 2018-06-25 MED ORDER — LISINOPRIL 20 MG PO TABS: 20.0000 mg | ORAL_TABLET | Freq: Every day | ORAL | 3 refills | Status: DC

## 2018-06-25 NOTE — Progress Notes (Signed)
BP (!) 146/80 (BP Location: Left Arm, Patient Position: Sitting, Cuff Size: Large)   Pulse 73   Temp 97.9 F (36.6 C)   Ht 5' 2.5" (1.588 m)   Wt (!) 300 lb 8 oz (136.3 kg)   SpO2 97%   BMI 54.09 kg/m    Subjective:    Patient ID: Amy Cardenas, female    DOB: [birth year], 47 y.o.   MRN: [id number]  HPI: Amy Cardenas is a 47 y.o. female presenting on 06/25/2018 for No chief complaint on file.   HPI  Pt has "bites" on LLE.  She says that they are getting better.  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 Interim medical history since our last visit reviewed. Allergies and medications reviewed and updated.   Current Outpatient Medications:  .  Cholecalciferol (VITAMIN D PO), Take 1 tablet by mouth daily., Disp: , Rfl:  .  IRON PO, Take 1 tablet by mouth daily., Disp: , Rfl:  .  lisinopril (PRINIVIL,ZESTRIL) 10 MG tablet, Take 1 tablet (10 mg total) by mouth daily., Disp: 30 tablet, Rfl: 3 .  metFORMIN (GLUCOPHAGE XR) 500 MG 24 hr tablet, Take 1 tablet (500 mg total) by mouth daily with breakfast., Disp: 30 tablet, Rfl: 3 .  triamcinolone (KENALOG) 0.025 % ointment, Apply 1 application topically 2 (two) times daily as needed., Disp: 30 g, Rfl: 0   Review of Systems  Constitutional: Negative for appetite change, chills, diaphoresis, fatigue, fever and unexpected weight change.  HENT: Negative for congestion, dental problem, drooling, ear pain, facial swelling, hearing loss, mouth sores, sneezing, sore throat, trouble swallowing and voice change.   Eyes: Negative for pain, discharge, redness, itching and visual disturbance.  Respiratory: Negative for cough, choking, shortness of breath and wheezing.   Cardiovascular: Positive for leg swelling. Negative for chest pain and palpitations.  Gastrointestinal:  Negative for abdominal pain, blood in stool, constipation, diarrhea and vomiting.  Endocrine: Negative for cold intolerance, heat intolerance and polydipsia.  Genitourinary: Negative for decreased urine volume, dysuria and hematuria.  Musculoskeletal: Negative for arthralgias, back pain and gait problem.  Skin: Negative for rash.  Allergic/Immunologic: Negative for environmental allergies.  Neurological: Negative for seizures, syncope, light-headedness and headaches.  Hematological: Negative for adenopathy.  Psychiatric/Behavioral: Negative for agitation, dysphoric mood and suicidal ideas. The patient is not nervous/anxious.     Per HPI unless specifically indicated above     Objective:    BP (!) 146/80 (BP Location: Left Arm, Patient Position: Sitting, Cuff Size: Large)   Pulse 73   Temp 97.9 F (36.6 C)   Ht 5' 2.5" (1.588 m)   Wt (!) 300 lb 8 oz (136.3 kg)   SpO2 97%   BMI 54.09 kg/m   Wt Readings from Last 3 Encounters:  06/25/18 (!) 300 lb 8 oz (136.3 kg)  05/28/18 (!) 306 lb (138.8 kg)  05/26/18 (!) 315 lb 8 oz (143.1 kg)    Physical Exam  Constitutional: She is oriented to person, place, and time. She appears well-developed and well-nourished.  HENT:  Head: Normocephalic and atraumatic.  Neck: Neck supple.  Cardiovascular: Normal rate and regular rhythm.  Pulmonary/Chest: Effort normal and breath sounds normal.  Abdominal: Soft. Bowel sounds are normal. She exhibits no mass. There is no hepatosplenomegaly. There is no  tenderness.  Lymphadenopathy:    She has no cervical adenopathy.  Neurological: She is alert and oriented to person, place, and time.  Skin: Skin is warm and dry.  Wounds LLE as pictured.  Mild swelling LLE  Psychiatric: She has a normal mood and affect. Her behavior is normal.  Vitals reviewed.          Results for orders placed or performed during the hospital encounter of 17/40/81  Basic metabolic panel  Result Value Ref Range   Sodium  140 135 - 145 mmol/L   Potassium 3.5 3.5 - 5.1 mmol/L   Chloride 103 98 - 111 mmol/L   CO2 25 22 - 32 mmol/L   Glucose, Bld 138 (H) 70 - 99 mg/dL   BUN 14 6 - 20 mg/dL   Creatinine, Ser 0.98 0.44 - 1.00 mg/dL   Calcium 9.7 8.9 - 10.3 mg/dL   GFR calc non Af Amer >60 >60 mL/min   GFR calc Af Amer >60 >60 mL/min   Anion gap 12 5 - 15  CBC with Differential/Platelet  Result Value Ref Range   WBC 9.7 4.0 - 10.5 K/uL   RBC 4.66 3.87 - 5.11 MIL/uL   Hemoglobin 10.1 (L) 12.0 - 15.0 g/dL   HCT 33.4 (L) 36.0 - 46.0 %   MCV 71.7 (L) 78.0 - 100.0 fL   MCH 21.7 (L) 26.0 - 34.0 pg   MCHC 30.2 30.0 - 36.0 g/dL   RDW 17.3 (H) 11.5 - 15.5 %   Platelets 364 150 - 400 K/uL   Neutrophils Relative % 69 %   Neutro Abs 6.7 1.7 - 7.7 K/uL   Lymphocytes Relative 22 %   Lymphs Abs 2.1 0.7 - 4.0 K/uL   Monocytes Relative 6 %   Monocytes Absolute 0.6 0.1 - 1.0 K/uL   Eosinophils Relative 3 %   Eosinophils Absolute 0.3 0.0 - 0.7 K/uL   Basophils Relative 0 %   Basophils Absolute 0.0 0.0 - 0.1 K/uL      Assessment & Plan:   Encounter Diagnoses  Name Primary?  . Essential hypertension Yes  . Bite, nonvenomous insect, shoulder or upper arm with infection, right, initial encounter   . Anemia, unspecified type     -reviewed labs with pt -will Increase lisinopril to 20mg  -pt encouraged to RTO immediately for any worsening of the "bites" on LLE -pt to continue iron for anemia -pt to follow up 1 month to recheck BP.  She is to RTO sooner prn

## 2018-07-09 ENCOUNTER — Other Ambulatory Visit (HOSPITAL_COMMUNITY): Payer: Self-pay | Admitting: *Deleted

## 2018-07-09 ENCOUNTER — Ambulatory Visit (HOSPITAL_COMMUNITY)
Admission: RE | Admit: 2018-07-09 | Discharge: 2018-07-09 | Disposition: A | Discharge status: Home / Self Care | Payer: Medicaid Other | Source: Ambulatory Visit | Attending: Obstetrics and Gynecology | Admitting: Obstetrics and Gynecology

## 2018-07-09 ENCOUNTER — Ambulatory Visit: Payer: Self-pay

## 2018-07-09 ENCOUNTER — Encounter (HOSPITAL_COMMUNITY): Payer: Self-pay

## 2018-07-09 VITALS — BP 112/78 | Ht 61.0 in | Wt 298.0 lb

## 2018-07-09 DIAGNOSIS — Z1239 Encounter for other screening for malignant neoplasm of breast: Secondary | ICD-10-CM

## 2018-07-09 DIAGNOSIS — N6310 Unspecified lump in the right breast, unspecified quadrant: Secondary | ICD-10-CM

## 2018-07-09 DIAGNOSIS — N6314 Unspecified lump in the right breast, lower inner quadrant: Secondary | ICD-10-CM

## 2018-07-09 NOTE — Patient Instructions (Signed)
Explained breast self awareness with Amy R Stidd. Patient did not need a Pap smear today due to last Pap smear and HPV Typing was 08/29/2016. Let her know BCCCP will cover Pap smears and HPV typing every 5 years unless has a history of abnormal Pap smears. Referred patient to the [facility] for a diagnostic mammogram and right breast ultrasound. Appointment scheduled for Friday, July 10, 2018 at 0900. Patient aware of appointment and will be there. Amy Cardenas verbalized understanding.  Stepen Prins, Arvil Chaco, RN 2:10 PM

## 2018-07-09 NOTE — Progress Notes (Signed)
Complaints of right breast lump x 2 months.  Pap Smear: Pap smear not completed today. Last Pap smear was 08/29/2016 at [facility] and normal with negative HPV. Per patient has no history of an abnormal Pap smear. Last Pap smear result is in Epic.  Physical exam: Breasts Right breast slightly larger than left that per patient is normal for her. No skin abnormalities bilateral breasts. No nipple retraction bilateral breasts. No nipple discharge bilateral breasts. No lymphadenopathy. No lumps palpated left breast. Palpated a lump within the right breast at 5 o'clock next to the nipple. No complaints of pain or tenderness on exam. Referred patient to the [facility] for a diagnostic mammogram and right breast ultrasound. Appointment scheduled for Friday, July 10, 2018 at 0900.        Pelvic/Bimanual No Pap smear completed today since last Pap smear and HPV typing was 08/29/2016. Pap smear not indicated per BCCCP guidelines.   Smoking History: Patient is a former smoker that quit in 2010.  Patient Navigation: Patient education provided. Access to services provided for patient through BCCCP program.   Breast and Cervical Cancer Risk Assessment: Patient has no family history of breast cancer, known genetic mutations, or radiation treatment to the chest before age 51. Patient has no history of cervical dysplasia, immunocompromised, or DES exposure in-utero.  Risk Assessment    Risk Scores      07/09/2018   Last edited by: Armond Hang, LPN   5-year risk: 0.9 %   Lifetime risk: 9.2 %

## 2018-07-10 ENCOUNTER — Ambulatory Visit
Admission: RE | Admit: 2018-07-10 | Discharge: 2018-07-10 | Disposition: A | Discharge status: Home / Self Care | Payer: Medicaid Other | Source: Ambulatory Visit | Attending: Obstetrics and Gynecology | Admitting: Obstetrics and Gynecology

## 2018-07-10 ENCOUNTER — Other Ambulatory Visit (HOSPITAL_COMMUNITY): Payer: Self-pay | Admitting: Obstetrics and Gynecology

## 2018-07-10 ENCOUNTER — Ambulatory Visit
Admission: RE | Admit: 2018-07-10 | Discharge: 2018-07-10 | Disposition: A | Discharge status: Home / Self Care | Payer: Self-pay | Source: Ambulatory Visit | Attending: Obstetrics and Gynecology | Admitting: Obstetrics and Gynecology

## 2018-07-10 DIAGNOSIS — N6310 Unspecified lump in the right breast, unspecified quadrant: Secondary | ICD-10-CM

## 2018-07-14 ENCOUNTER — Ambulatory Visit
Admission: RE | Admit: 2018-07-14 | Discharge: 2018-07-14 | Disposition: A | Discharge status: Home / Self Care | Payer: PRIVATE HEALTH INSURANCE | Source: Ambulatory Visit | Attending: Obstetrics and Gynecology | Admitting: Obstetrics and Gynecology

## 2018-07-14 DIAGNOSIS — N6310 Unspecified lump in the right breast, unspecified quadrant: Secondary | ICD-10-CM

## 2018-07-23 ENCOUNTER — Encounter (HOSPITAL_COMMUNITY): Payer: Self-pay | Admitting: *Deleted

## 2018-07-23 ENCOUNTER — Ambulatory Visit: Payer: Medicaid Other | Admitting: Physician Assistant

## 2018-07-23 ENCOUNTER — Other Ambulatory Visit: Payer: Self-pay | Admitting: General Surgery

## 2018-07-23 DIAGNOSIS — R928 Other abnormal and inconclusive findings on diagnostic imaging of breast: Secondary | ICD-10-CM

## 2018-07-27 NOTE — Pre-Procedure Instructions (Signed)
Amy Cardenas  07/27/2018      [facility] [address], [place] [address] [place] [address] Phone: [phone] Fax: [phone]    Your procedure is scheduled on 08/04/18.  Report to [facility] Admitting at 945 A.M.  Call this number if you have problems the morning of surgery:  [phone]   Remember:  Do not eat or drink after midnight.     Take these medicines the morning of surgery with A SIP OF WATER --all inhalers    Do not wear jewelry, make-up or nail polish.  Do not wear lotions, powders, or perfumes, or deodorant.  Do not shave 48 hours prior to surgery.  Men may shave face and neck.  Do not bring valuables to the hospital.  [facility] is not responsible for any belongings or valuables.  Contacts, dentures or bridgework may not be worn into surgery.  Leave your suitcase in the car.  After surgery it may be brought to your room.  For patients admitted to the hospital, discharge time will be determined by your treatment team.  Patients discharged the day of surgery will not be allowed to drive home.   Name and phone number of your driver:   Do not take any aspirin,anti-inflammatories,vitamins,or herbal supplements 5-7 days prior to surgery. Special instructions:     How to Manage Your Diabetes Before and After Surgery  Why is it important to control my blood sugar before and after surgery? . Improving blood sugar levels before and after surgery helps healing and can limit problems. . A way of improving blood sugar control is eating a healthy diet by: o  Eating less sugar and carbohydrates o  Increasing activity/exercise o  Talking with your doctor about reaching your blood sugar goals . High blood sugars (greater than 180 mg/dL) can raise your risk of infections and slow your recovery, so you will need to focus on controlling your diabetes during the weeks before surgery. . Make sure that the doctor who takes  care of your diabetes knows about your planned surgery including the date and location.  How do I manage my blood sugar before surgery? . Check your blood sugar at least 4 times a day, starting 2 days before surgery, to make sure that the level is not too high or low. o Check your blood sugar the morning of your surgery when you wake up and every 2 hours until you get to the Short Stay unit. . If your blood sugar is less than 70 mg/dL, you will need to treat for low blood sugar: o Do not take insulin. o Treat a low blood sugar (less than 70 mg/dL) with  cup of clear juice (cranberry or apple), 4 glucose tablets, OR glucose gel. Recheck blood sugar in 15 minutes after treatment (to make sure it is greater than 70 mg/dL). If your blood sugar is not greater than 70 mg/dL on recheck, call [phone] o  for further instructions. . Report your blood sugar to the short stay nurse when you get to Short Stay.  . If you are admitted to the hospital after surgery: o Your blood sugar will be checked by the staff and you will probably be given insulin after surgery (instead of oral diabetes medicines) to make sure you have good blood sugar levels. o The goal for blood sugar control after surgery is 80-180 mg/dL.  WHAT DO I DO ABOUT MY DIABETES MEDICATION?   [name] Do not take oral diabetes medicines (pills) the morning of surgery.  [place] - Preparing for Surgery  Before surgery, you can play an important role.  Because skin is not sterile, your skin needs to be as free of germs as possible.  You can reduce the number of germs on you skin by washing with CHG (chlorahexidine gluconate) soap before surgery.  CHG is an antiseptic cleaner which kills germs and bonds with the skin to continue killing germs even after washing.  Oral Hygiene is also important in reducing the risk of infection.  Remember to brush your teeth with your regular toothpaste the morning of surgery.  Please DO  NOT use if you have an allergy to CHG or antibacterial soaps.  If your skin becomes reddened/irritated stop using the CHG and inform your nurse when you arrive at Short Stay.  Do not shave (including legs and underarms) for at least 48 hours prior to the first CHG shower.  You may shave your face.  Please follow these instructions carefully:   1.  Shower with CHG Soap the night before surgery and the morning of Surgery.  2.  If you choose to wash your hair, wash your hair first as usual with your normal shampoo.  3.  After you shampoo, rinse your hair and body thoroughly to remove the shampoo. 4.  Use CHG as you would any other liquid soap.  You can apply chg directly to the skin and wash gently with a      scrungie or washcloth.           5.  Apply the CHG Soap to your body ONLY FROM THE NECK DOWN.   Do not use on open wounds or open sores. Avoid contact with your eyes, ears, mouth and genitals (private parts).  Wash genitals (private parts) with your normal soap.  6.  Wash thoroughly, paying special attention to the area where your surgery will be performed.  7.  Thoroughly rinse your body with warm water from the neck down.  8.  DO NOT shower/wash with your normal soap after using and rinsing off the CHG Soap.  9.  Pat yourself dry with a clean towel.            10.  Wear clean pajamas.            11.  Place clean sheets on your bed the night of your first shower and do not sleep with pets.  Day of Surgery  Do not apply any lotions/deoderants the morning of surgery.   Please wear clean clothes to the hospital/surgery center. Remember to brush your teeth with toothpaste.  [place] - Preparing for Surgery  Before surgery, you can play an important role.  Because skin is not sterile, your skin needs to be as free of germs as possible.  You can reduce the number of germs on you skin by washing with CHG (chlorahexidine gluconate) soap before surgery.  CHG is an antiseptic cleaner which  kills germs and bonds with the skin to continue killing germs even after washing.  Oral Hygiene is also important in reducing the risk of infection.  Remember to brush your teeth with your regular toothpaste the morning of surgery.  Please DO NOT use if you have an allergy to CHG or antibacterial soaps.  If your skin becomes reddened/irritated stop using the CHG and inform your nurse when you arrive  at Short Stay.  Do not shave (including legs and underarms) for at least 48 hours prior to the first CHG shower.  You may shave your face.  Please follow these instructions carefully:   1.  Shower with CHG Soap the night before surgery and the morning of Surgery.  2.  If you choose to wash your hair, wash your hair first as usual with your normal shampoo.  3.  After you shampoo, rinse your hair and body thoroughly to remove the shampoo. 4.  Use CHG as you would any other liquid soap.  You can apply chg directly to the skin and wash gently with a      scrungie or washcloth.           5.  Apply the CHG Soap to your body ONLY FROM THE NECK DOWN.   Do not use on open wounds or open sores. Avoid contact with your eyes, ears, mouth and genitals (private parts).  Wash genitals (private parts) with your normal soap.  6.  Wash thoroughly, paying special attention to the area where your surgery will be performed.  7.  Thoroughly rinse your body with warm water from the neck down.  8.  DO NOT shower/wash with your normal soap after using and rinsing off the CHG Soap.  9.  Pat yourself dry with a clean towel.            10.  Wear clean pajamas.            11.  Place clean sheets on your bed the night of your first shower and do not sleep with pets.  Day of Surgery  Do not apply any lotions/deoderants the morning of surgery.   Please wear clean clothes to the hospital/surgery center. Remember to brush your teeth with toothpaste.  .   . The day of surgery, do not take other diabetes injectables, including  Byetta (exenatide), Bydureon (exenatide ER), Victoza (liraglutide), or Trulicity (dulaglutide).  . If your CBG is greater than 220 mg/dL, you may take  of your sliding scale (correction) dose of insulin.  Other Instructions:          Patient Signature:  Date:   Nurse Signature:  Date:   Reviewed and Endorsed by [facility] Patient Education Committee, August 2015  Please read over the following fact sheets that you were given.

## 2018-07-28 ENCOUNTER — Other Ambulatory Visit: Payer: Self-pay

## 2018-07-28 ENCOUNTER — Encounter (HOSPITAL_COMMUNITY)
Admission: RE | Admit: 2018-07-28 | Discharge: 2018-07-28 | Disposition: A | Discharge status: Home / Self Care | Payer: PRIVATE HEALTH INSURANCE | Source: Ambulatory Visit | Attending: General Surgery | Admitting: General Surgery

## 2018-07-28 ENCOUNTER — Encounter (HOSPITAL_COMMUNITY): Payer: Self-pay

## 2018-07-28 ENCOUNTER — Ambulatory Visit: Payer: Medicaid Other | Admitting: Physician Assistant

## 2018-07-28 DIAGNOSIS — Z01818 Encounter for other preprocedural examination: Secondary | ICD-10-CM | POA: Insufficient documentation

## 2018-07-28 DIAGNOSIS — N6310 Unspecified lump in the right breast, unspecified quadrant: Secondary | ICD-10-CM | POA: Insufficient documentation

## 2018-07-28 DIAGNOSIS — I10 Essential (primary) hypertension: Secondary | ICD-10-CM | POA: Insufficient documentation

## 2018-07-28 DIAGNOSIS — R9431 Abnormal electrocardiogram [ECG] [EKG]: Secondary | ICD-10-CM | POA: Insufficient documentation

## 2018-07-28 HISTORY — DX: Umbilical hernia without obstruction or gangrene: K42.9

## 2018-07-28 LAB — CBC WITH DIFFERENTIAL/PLATELET
ABS IMMATURE GRANULOCYTES: 0.07 10*3/uL (ref 0.00–0.07)
BASOS PCT: 0 %
Basophils Absolute: 0 10*3/uL (ref 0.0–0.1)
EOS PCT: 2 %
Eosinophils Absolute: 0.2 10*3/uL (ref 0.0–0.5)
HCT: 36 % (ref 36.0–46.0)
Hemoglobin: 10 g/dL — ABNORMAL LOW (ref 12.0–15.0)
Immature Granulocytes: 1 %
Lymphocytes Relative: 28 %
Lymphs Abs: 2.6 10*3/uL (ref 0.7–4.0)
MCH: 20.5 pg — AB (ref 26.0–34.0)
MCHC: 27.8 g/dL — AB (ref 30.0–36.0)
MCV: 73.9 fL — AB (ref 80.0–100.0)
MONO ABS: 0.7 10*3/uL (ref 0.1–1.0)
MONOS PCT: 7 %
Neutro Abs: 5.6 10*3/uL (ref 1.7–7.7)
Neutrophils Relative %: 62 %
PLATELETS: 446 10*3/uL — AB (ref 150–400)
RBC: 4.87 MIL/uL (ref 3.87–5.11)
RDW: 16.9 % — ABNORMAL HIGH (ref 11.5–15.5)
WBC: 9.2 10*3/uL (ref 4.0–10.5)
nRBC: 0 % (ref 0.0–0.2)

## 2018-07-28 LAB — COMPREHENSIVE METABOLIC PANEL
ALT: 15 U/L (ref 0–44)
AST: 18 U/L (ref 15–41)
Albumin: 3.6 g/dL (ref 3.5–5.0)
Alkaline Phosphatase: 48 U/L (ref 38–126)
Anion gap: 8 (ref 5–15)
BILIRUBIN TOTAL: 0.6 mg/dL (ref 0.3–1.2)
BUN: 12 mg/dL (ref 6–20)
CHLORIDE: 105 mmol/L (ref 98–111)
CO2: 27 mmol/L (ref 22–32)
CREATININE: 0.91 mg/dL (ref 0.44–1.00)
Calcium: 9.1 mg/dL (ref 8.9–10.3)
Glucose, Bld: 83 mg/dL (ref 70–99)
Potassium: 3.4 mmol/L — ABNORMAL LOW (ref 3.5–5.1)
Sodium: 140 mmol/L (ref 135–145)
TOTAL PROTEIN: 6.9 g/dL (ref 6.5–8.1)

## 2018-07-28 LAB — GLUCOSE, CAPILLARY: GLUCOSE-CAPILLARY: 89 mg/dL (ref 70–99)

## 2018-07-28 NOTE — Pre-Procedure Instructions (Addendum)
[place] R Paprocki  07/28/2018      [facility] [address] [place] [address] [place] [address] Phone: [phone] Fax: [phone]    Your procedure is scheduled on Tuesday, November 12th.  Report to [facility] Admitting at 9:45 A.M.  Call this number if you have problems the morning of surgery:  [phone]   Remember:  Do not eat  after midnight.  You may drink clear liquids until 8:45 A.M .  Clear liquids allowed are: Water, Juice (non-citric and without pulp), Carbonated beverages, Clear Tea, Black Coffee only and Gatorade    Take these medicines the morning of surgery with A SIP OF WATER: NONE albuterol (PROVENTIL HFA;VENTOLIN HFA)-if needed; please bring with you to the hospital.    7 days prior to surgery STOP taking any Aspirin(unless otherwise instructed by your surgeon), Aleve, Naproxen, Ibuprofen, Motrin, Advil, Goody's, BC's, all herbal medications, fish oil, and all vitamins.   WHAT DO I DO ABOUT MY DIABETES MEDICATION?   [name] Do not take oral diabetes medicines (pills) the morning of surgery. DO NOT TAKE YOUR meTOFORMIN.   How to Manage Your Diabetes Before and After Surgery  Why is it important to control my blood sugar before and after surgery? . Improving blood sugar levels before and after surgery helps healing and can limit problems. . A way of improving blood sugar control is eating a healthy diet by: o  Eating less sugar and carbohydrates o  Increasing activity/exercise o  Talking with your doctor about reaching your blood sugar goals . High blood sugars (greater than 180 mg/dL) can raise your risk of infections and slow your recovery, so you will need to focus on controlling your diabetes during the weeks before surgery. . Make sure that the doctor who takes care of your diabetes knows about your planned surgery including the date and location.  How do I manage my blood sugar before surgery? . Check your  blood sugar at least 4 times a day, starting 2 days before surgery, to make sure that the level is not too high or low. o Check your blood sugar the morning of your surgery when you wake up and every 2 hours until you get to the Short Stay unit. . If your blood sugar is less than 70 mg/dL, you will need to treat for low blood sugar: o Do not take insulin. o Treat a low blood sugar (less than 70 mg/dL) with  cup of clear juice (cranberry or apple), 4 glucose tablets, OR glucose gel. o Recheck blood sugar in 15 minutes after treatment (to make sure it is greater than 70 mg/dL). If your blood sugar is not greater than 70 mg/dL on recheck, call [phone] for further instructions. . Report your blood sugar to the short stay nurse when you get to Short Stay.  . If you are admitted to the hospital after surgery: o Your blood sugar will be checked by the staff and you will probably be given insulin after surgery (instead of oral diabetes medicines) to make sure you have good blood sugar levels. o The goal for blood sugar control after surgery is 80-180 mg/dL.   Do not wear jewelry, make-up or nail polish.  Do not wear lotions, powders, or perfumes, or deodorant.  Do not shave 48 hours prior to surgery.    Do not bring valuables to the hospital.  [facility] is not responsible for any belongings  or valuables.  Contacts, dentures or bridgework may not be worn into surgery.  Leave your suitcase in the car.  After surgery it may be brought to your room.  For patients admitted to the hospital, discharge time will be determined by your treatment team.  Patients discharged the day of surgery will not be allowed to drive home.   Special instructions:   [place]- Preparing For Surgery  Before surgery, you can play an important role. Because skin is not sterile, your skin needs to be as free of germs as possible. You can reduce the number of germs on your skin by washing with CHG (chlorahexidine  gluconate) Soap before surgery.  CHG is an antiseptic cleaner which kills germs and bonds with the skin to continue killing germs even after washing.    Oral Hygiene is also important to reduce your risk of infection.  Remember - BRUSH YOUR TEETH THE MORNING OF SURGERY WITH YOUR REGULAR TOOTHPASTE  Please do not use if you have an allergy to CHG or antibacterial soaps. If your skin becomes reddened/irritated stop using the CHG.  Do not shave (including legs and underarms) for at least 48 hours prior to first CHG shower. It is OK to shave your face.  Please follow these instructions carefully.   1. Shower the NIGHT BEFORE SURGERY and the MORNING OF SURGERY with CHG.   2. If you chose to wash your hair, wash your hair first as usual with your normal shampoo.  3. After you shampoo, rinse your hair and body thoroughly to remove the shampoo.  4. Use CHG as you would any other liquid soap. You can apply CHG directly to the skin and wash gently with a scrungie or a clean washcloth.   5. Apply the CHG Soap to your body ONLY FROM THE NECK DOWN.  Do not use on open wounds or open sores. Avoid contact with your eyes, ears, mouth and genitals (private parts). Wash Face and genitals (private parts)  with your normal soap.  6. Wash thoroughly, paying special attention to the area where your surgery will be performed.  7. Thoroughly rinse your body with warm water from the neck down.  8. DO NOT shower/wash with your normal soap after using and rinsing off the CHG Soap.  9. Pat yourself dry with a CLEAN TOWEL.  10. Wear CLEAN PAJAMAS to bed the night before surgery, wear comfortable clothes the morning of surgery  11. Place CLEAN SHEETS on your bed the night of your first shower and DO NOT SLEEP WITH PETS.    Day of Surgery:  Do not apply any deodorants/lotions.  Please wear clean clothes to the hospital/surgery center.   Remember to brush your teeth WITH YOUR REGULAR TOOTHPASTE.  Please read  over the following fact sheets that you were given.

## 2018-07-28 NOTE — Progress Notes (Signed)
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 Pt stated that her PCP is doing a trial with her A1C levels along with diet and weight loss to see if her levels could come down. Pt is on Metformin but does not check her CBG at home.  CBG at PAT appointment was 12.   Aspirin Instructions: N/A  Anesthesia review: Yes, EKG review   Patient denies shortness of breath, fever, cough and chest pain at PAT appointment   Patient verbalized understanding of instructions that were given to them at the PAT appointment. Patient was also instructed that they will need to review over the PAT instructions again at home before surgery.

## 2018-07-28 NOTE — Progress Notes (Signed)
   07/28/18 1411  OBSTRUCTIVE SLEEP APNEA  Have you ever been diagnosed with sleep apnea through a sleep study? No  Do you snore loudly (loud enough to be heard through closed doors)?  1  Do you often feel tired, fatigued, or sleepy during the daytime (such as falling asleep during driving or talking to someone)? 0  Has anyone observed you stop breathing during your sleep? 0  Do you have, or are you being treated for high blood pressure? 1  BMI more than 35 kg/m2? 1  Age > 50 (1-yes) 0  Neck circumference greater than:Female 16 inches or larger, Female 17inches or larger? 1  Female Gender (Yes=1) 0  Obstructive Sleep Apnea Score 4  Score 5 or greater  Results sent to PCP

## 2018-08-02 NOTE — H&P (Signed)
Amy Cardenas Location: [facility] Patient #: [id number] DOB: July 31, 1971 Single / Language: Amy Cardenas / Race: Black or African American Female       History of Present Illness      . This is a 47 year old female, referred by Dr. Lovey Newcomer for evaluation of complex sclerosing lesion right breast, 4 o'clock position. Amy Dryer, NP provides primary care.      The patient thought she felt something palpable at the 6 o'clock position of her right breast behind the areola. Imaging studies were done. Category B. There were some cystic areas but there was also a small mass in the right breast at the 4 o'clock position with cystic components and internal blood flow. There were 2 borderline abnormal lymph nodes in the right axilla. Image guided biopsy of the right breast mass at 4:00 showed complex sclerosing lesion. Differential diagnosis includes papilloma read by Dr. Lyndon Code. Six-month follow-up on the right axillary lymph nodes was felt to be appropriate and less cancer was found in the breast.      Past history includes BMI 51, hypertension, non-insulin-dependent diabetes, anxiety. 2 C-sections through Pfannenstiel incision, tubal ligation. Umbilical hernia Family history reveals great aunt had ovarian cancer. Maternal aunt and maternal great aunt had breast cancer. Father had prostate cancer treated with seeds. He also had colon cancer. Maternal grandmother had colon cancer Social history reveals she is single. [place]. 2 children. Denies alcohol or tobacco. 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 I told her that the area at 4:00 right breast was CSL and was probably benign but there was a small chance of cancer and that excision was recommended. She would like to do this and does not want to take any chance.   She'll be scheduled for right breast lumpectomy with radioactive seed localization. I discussed the indications, techniques, and risk of this surgery with her. She is aware of the risk of bleeding, infection, reoperation of cancer, cosmetic deformity, chronic pain. She understands all these issues. All of her questions were answered. She agrees with this plan.     Past Surgical History  Breast Biopsy  Right. Cesarean Section - Multiple   Diagnostic Studies History  Colonoscopy  never Mammogram  within last year Pap Smear  1-5 years ago  Allergies  Sulfa Antibiotics  Penicillins  Allergies Reconciled   Medication History Lisinopril (20MG  Tablet, Oral) Active. metFORMIN HCl ER (500MG  Tablet ER 24HR, Oral) Active. Iron (325 (65 Fe)MG Tablet, Oral) Active. Cholecalciferol (400UNIT Capsule, Oral) Active. Medications Reconciled  Social History  Alcohol use  Occasional alcohol use. Caffeine use  Tea. No drug use  Tobacco use  Former smoker.  Family History Alcohol Abuse  Father. Colon Cancer  Father. Diabetes Mellitus  Father. Hypertension  Son. Prostate Cancer  Father.  Pregnancy / Birth History  Age at menarche  68 years. Contraceptive History  Oral contraceptives. Gravida  2 Length (months) of breastfeeding  3-6 Maternal age  66-25 Para  2 Regular periods   Other Problems  Diabetes Mellitus  High blood pressure  Lump In Breast     Review of Systems General Present- Fatigue. Not Present- Appetite Loss, Chills, Fever, Night Sweats, Weight Gain and Weight Loss. Skin Not Present- Change in Wart/Mole, Dryness, Hives, Jaundice, New Lesions, Non-Healing Wounds, Rash and Ulcer.  HEENT Present- Wears glasses/contact lenses. Not Present- Earache, Hearing Loss, Hoarseness, Nose Bleed, Oral Ulcers, Ringing in the Ears, Seasonal Allergies, Sinus Pain, Sore Throat, Visual Disturbances and Yellow Eyes. Respiratory Present- Snoring. Not  Present- Bloody sputum, Chronic Cough, Difficulty Breathing and Wheezing. Breast Present- Breast Mass. Not Present- Breast Pain, Nipple Discharge and Skin Changes. Cardiovascular Not Present- Chest Pain, Difficulty Breathing Lying Down, Leg Cramps, Palpitations, Rapid Heart Rate, Shortness of Breath and Swelling of Extremities. Female Genitourinary Not Present- Frequency, Nocturia, Painful Urination, Pelvic Pain and Urgency. Musculoskeletal Not Present- Back Pain, Joint Pain, Joint Stiffness, Muscle Pain, Muscle Weakness and Swelling of Extremities. Neurological Not Present- Decreased Memory, Fainting, Headaches, Numbness, Seizures, Tingling, Tremor, Trouble walking and Weakness. Psychiatric Not Present- Anxiety, Bipolar, Change in Sleep Pattern, Depression, Fearful and Frequent crying. Endocrine Present- New Diabetes. Not Present- Cold Intolerance, Excessive Hunger, Hair Changes, Heat Intolerance and Hot flashes. Hematology Not Present- Blood Thinners, Easy Bruising, Excessive bleeding, Gland problems, HIV and Persistent Infections.  Vitals  Weight: 298.4 lb Height: 64in Body Surface Area: 2.32 m Body Mass Index: 51.22 kg/m  Temp.: 96.64F  Pulse: 107 (Regular)  BP: 162/82 (Sitting, Left Arm, Standard)       Physical Exam  General Mental Status-Alert. General Appearance-Consistent with stated age. Hydration-Well hydrated. Voice-Normal.  Head and Neck Head-normocephalic, atraumatic with no lesions or palpable masses. Trachea-midline. Thyroid Gland Characteristics - normal size and consistency.  Eye Eyeball - Bilateral-Extraocular movements intact. Sclera/Conjunctiva - Bilateral-No scleral icterus.  Chest and Lung Exam Chest and lung exam reveals -quiet, even and easy respiratory effort with no use of accessory muscles and on auscultation, normal breath sounds, no adventitious sounds and normal vocal resonance. Inspection Chest Wall - Normal.  Back - normal.  Breast Note: Breast moderately large. Biopsy site noted. I don't feel a mass in either breast except for a tiny cyst behind the right areola at the 6 o'clock position. No other skin changes. No axillary adenopathy.   Cardiovascular Cardiovascular examination reveals -normal heart sounds, regular rate and rhythm with no murmurs and normal pedal pulses bilaterally.  Abdomen Inspection  Inspection of the abdomen reveals: Note: She has an incarcerated umbilical hernia. Hernia sac perhaps 2.5 cm with presumably incarcerated preperitoneal fat. Skin healthy. Skin - Scar - Note: Well-healed Pfannenstiel incision. Palpation/Percussion Palpation and Percussion of the abdomen reveal - Soft, Non Tender, No Rebound tenderness, No Rigidity (guarding) and No hepatosplenomegaly. Auscultation Auscultation of the abdomen reveals - Bowel sounds normal.  Neurologic Neurologic evaluation reveals -alert and oriented x 3 with no impairment of recent or remote memory. Mental Status-Normal.  Musculoskeletal Normal Exam - Left-Upper Extremity Strength Normal and Lower Extremity Strength Normal. Normal Exam - Right-Upper Extremity Strength Normal and Lower Extremity Strength Normal.  Lymphatic Head & Neck  General Head & Neck Lymphatics: Bilateral - Description - Normal. Axillary  General Axillary Region: Bilateral - Description - Normal. Tenderness - Non Tender. Femoral & Inguinal  Generalized Femoral & Inguinal Lymphatics: Bilateral - Description - Normal. Tenderness - Non Tender.    Assessment & Plan  ABNORMAL MAMMOGRAM OF RIGHT BREAST (R92.8)   You recently felt a lump below your right nipple This is probably a benign cyst  The radiologist found an area of distortion in the right breast at the 4 o'clock position and 2 borderline enlarged lymph nodes Biopsy of the right breast mass at 4 o'clock position reveals a condition called complex sclerosing lesion This is  not cancer, but there is a statistical risk that  you have cancer somewhere in the region of 4-9% Standard recommendation is a conservative excision of this area. You agree with this plan  you will be scheduled for right breast lumpectomy with radioactive seed localization We have discussed the indications, techniques, and risk of the surgery in detail with you and your mother.  TYPE 2 DIABETES MELLITUS TREATED WITHOUT INSULIN (E11.9) BMI 50.0-59.9, ADULT (N01.41) UMBILICAL HERNIA WITH OBSTRUCTION (K42.0) HISTORY OF C-SECTION (P97.331) HYPERTENSION, ESSENTIAL, BENIGN (I10)    Carlos Heber M. Dalbert Batman, M.D., [facility] General and Minimally invasive Surgery Breast and Colorectal Surgery Office:   [phone] Pager:   [phone]

## 2018-08-03 ENCOUNTER — Other Ambulatory Visit: Payer: Self-pay | Admitting: General Surgery

## 2018-08-03 ENCOUNTER — Ambulatory Visit
Admission: RE | Admit: 2018-08-03 | Discharge: 2018-08-03 | Disposition: A | Discharge status: Home / Self Care | Payer: Medicaid Other | Source: Ambulatory Visit | Attending: General Surgery | Admitting: General Surgery

## 2018-08-03 DIAGNOSIS — R928 Other abnormal and inconclusive findings on diagnostic imaging of breast: Secondary | ICD-10-CM

## 2018-08-04 ENCOUNTER — Ambulatory Visit (HOSPITAL_COMMUNITY): Payer: Self-pay | Admitting: Physician Assistant

## 2018-08-04 ENCOUNTER — Encounter (HOSPITAL_COMMUNITY)
Admission: RE | Disposition: A | Discharge status: Home / Self Care | Payer: Self-pay | Source: Ambulatory Visit | Attending: General Surgery

## 2018-08-04 ENCOUNTER — Ambulatory Visit (HOSPITAL_COMMUNITY)
Admission: RE | Admit: 2018-08-04 | Discharge: 2018-08-04 | Disposition: A | Discharge status: Home / Self Care | Payer: Self-pay | Source: Ambulatory Visit | Attending: General Surgery | Admitting: General Surgery

## 2018-08-04 ENCOUNTER — Encounter (HOSPITAL_COMMUNITY): Payer: Self-pay | Admitting: *Deleted

## 2018-08-04 ENCOUNTER — Ambulatory Visit
Admission: RE | Admit: 2018-08-04 | Discharge: 2018-08-04 | Disposition: A | Discharge status: Home / Self Care | Payer: Medicaid Other | Source: Ambulatory Visit | Attending: General Surgery | Admitting: General Surgery

## 2018-08-04 ENCOUNTER — Ambulatory Visit (HOSPITAL_COMMUNITY): Payer: Self-pay | Admitting: Certified Registered"

## 2018-08-04 DIAGNOSIS — Z803 Family history of malignant neoplasm of breast: Secondary | ICD-10-CM | POA: Insufficient documentation

## 2018-08-04 DIAGNOSIS — R928 Other abnormal and inconclusive findings on diagnostic imaging of breast: Secondary | ICD-10-CM

## 2018-08-04 DIAGNOSIS — D649 Anemia, unspecified: Secondary | ICD-10-CM | POA: Insufficient documentation

## 2018-08-04 DIAGNOSIS — I10 Essential (primary) hypertension: Secondary | ICD-10-CM | POA: Insufficient documentation

## 2018-08-04 DIAGNOSIS — D241 Benign neoplasm of right breast: Secondary | ICD-10-CM | POA: Insufficient documentation

## 2018-08-04 DIAGNOSIS — Z87891 Personal history of nicotine dependence: Secondary | ICD-10-CM | POA: Insufficient documentation

## 2018-08-04 DIAGNOSIS — Z8041 Family history of malignant neoplasm of ovary: Secondary | ICD-10-CM | POA: Insufficient documentation

## 2018-08-04 DIAGNOSIS — E119 Type 2 diabetes mellitus without complications: Secondary | ICD-10-CM | POA: Insufficient documentation

## 2018-08-04 DIAGNOSIS — Z8042 Family history of malignant neoplasm of prostate: Secondary | ICD-10-CM | POA: Insufficient documentation

## 2018-08-04 DIAGNOSIS — K420 Umbilical hernia with obstruction, without gangrene: Secondary | ICD-10-CM | POA: Insufficient documentation

## 2018-08-04 DIAGNOSIS — Z7984 Long term (current) use of oral hypoglycemic drugs: Secondary | ICD-10-CM | POA: Insufficient documentation

## 2018-08-04 DIAGNOSIS — Z79899 Other long term (current) drug therapy: Secondary | ICD-10-CM | POA: Insufficient documentation

## 2018-08-04 HISTORY — PX: BREAST LUMPECTOMY WITH RADIOACTIVE SEED LOCALIZATION: SHX6424

## 2018-08-04 HISTORY — DX: Other abnormal and inconclusive findings on diagnostic imaging of breast: R92.8

## 2018-08-04 LAB — GLUCOSE, CAPILLARY
GLUCOSE-CAPILLARY: 111 mg/dL — AB (ref 70–99)
Glucose-Capillary: 132 mg/dL — ABNORMAL HIGH (ref 70–99)

## 2018-08-04 LAB — POCT PREGNANCY, URINE: PREG TEST UR: NEGATIVE

## 2018-08-04 SURGERY — BREAST LUMPECTOMY WITH RADIOACTIVE SEED LOCALIZATION
Anesthesia: General | Site: Breast | Laterality: Right

## 2018-08-04 MED ORDER — SUGAMMADEX SODIUM 200 MG/2ML IV SOLN
INTRAVENOUS | Status: AC
  Filled 2018-08-04: qty 6

## 2018-08-04 MED ORDER — PROPOFOL 10 MG/ML IV BOLUS
INTRAVENOUS | Status: AC
  Filled 2018-08-04: qty 20

## 2018-08-04 MED ORDER — SUCCINYLCHOLINE CHLORIDE 200 MG/10ML IV SOSY
PREFILLED_SYRINGE | INTRAVENOUS | Status: DC | PRN
  Administered 2018-08-04: 120 mg via INTRAVENOUS

## 2018-08-04 MED ORDER — ONDANSETRON HCL 4 MG/2ML IJ SOLN
INTRAMUSCULAR | Status: DC | PRN
  Administered 2018-08-04: 4 mg via INTRAVENOUS

## 2018-08-04 MED ORDER — DEXAMETHASONE SODIUM PHOSPHATE 10 MG/ML IJ SOLN
INTRAMUSCULAR | Status: DC | PRN
  Administered 2018-08-04: 4 mg via INTRAVENOUS

## 2018-08-04 MED ORDER — LACTATED RINGERS IV SOLN
INTRAVENOUS | Status: DC
  Administered 2018-08-04: 10:00:00 via INTRAVENOUS

## 2018-08-04 MED ORDER — GABAPENTIN 300 MG PO CAPS: 300.0000 mg | ORAL_CAPSULE | ORAL | Status: DC

## 2018-08-04 MED ORDER — DEXAMETHASONE SODIUM PHOSPHATE 10 MG/ML IJ SOLN
INTRAMUSCULAR | Status: AC
  Filled 2018-08-04: qty 1

## 2018-08-04 MED ORDER — ACETAMINOPHEN 500 MG PO TABS: 1000.0000 mg | ORAL_TABLET | ORAL | Status: DC

## 2018-08-04 MED ORDER — FENTANYL CITRATE (PF) 250 MCG/5ML IJ SOLN
INTRAMUSCULAR | Status: AC
  Filled 2018-08-04: qty 5

## 2018-08-04 MED ORDER — FENTANYL CITRATE (PF) 100 MCG/2ML IJ SOLN
25.0000 ug | INTRAMUSCULAR | Status: DC | PRN
  Administered 2018-08-04 (×2): 50 ug via INTRAVENOUS

## 2018-08-04 MED ORDER — GABAPENTIN 300 MG PO CAPS
ORAL_CAPSULE | ORAL | Status: AC
  Administered 2018-08-04: 300 mg
  Filled 2018-08-04: qty 1

## 2018-08-04 MED ORDER — VANCOMYCIN HCL 10 G IV SOLR
1500.0000 mg | Freq: Once | INTRAVENOUS | Status: AC
  Administered 2018-08-04: 1500 mg via INTRAVENOUS
  Filled 2018-08-04: qty 1500

## 2018-08-04 MED ORDER — 0.9 % SODIUM CHLORIDE (POUR BTL) OPTIME
TOPICAL | Status: DC | PRN
  Administered 2018-08-04: 1000 mL

## 2018-08-04 MED ORDER — BUPIVACAINE-EPINEPHRINE 0.25% -1:200000 IJ SOLN
INTRAMUSCULAR | Status: DC | PRN
  Administered 2018-08-04: 7 mL

## 2018-08-04 MED ORDER — MIDAZOLAM HCL 5 MG/5ML IJ SOLN
INTRAMUSCULAR | Status: DC | PRN
  Administered 2018-08-04: 2 mg via INTRAVENOUS

## 2018-08-04 MED ORDER — SUGAMMADEX SODIUM 200 MG/2ML IV SOLN
INTRAVENOUS | Status: DC | PRN
  Administered 2018-08-04: 270 mg via INTRAVENOUS
  Administered 2018-08-04: 230 mg via INTRAVENOUS

## 2018-08-04 MED ORDER — BUPIVACAINE-EPINEPHRINE (PF) 0.25% -1:200000 IJ SOLN
INTRAMUSCULAR | Status: AC
  Filled 2018-08-04: qty 30

## 2018-08-04 MED ORDER — MIDAZOLAM HCL 2 MG/2ML IJ SOLN
INTRAMUSCULAR | Status: AC
  Filled 2018-08-04: qty 2

## 2018-08-04 MED ORDER — VANCOMYCIN HCL IN DEXTROSE 1-5 GM/200ML-% IV SOLN
INTRAVENOUS | Status: AC
  Filled 2018-08-04: qty 200

## 2018-08-04 MED ORDER — CHLORHEXIDINE GLUCONATE CLOTH 2 % EX PADS: 6.0000 | MEDICATED_PAD | Freq: Once | CUTANEOUS | Status: DC

## 2018-08-04 MED ORDER — PROPOFOL 10 MG/ML IV BOLUS
INTRAVENOUS | Status: DC | PRN
  Administered 2018-08-04: 200 mg via INTRAVENOUS

## 2018-08-04 MED ORDER — ESMOLOL HCL 100 MG/10ML IV SOLN
INTRAVENOUS | Status: AC
  Filled 2018-08-04: qty 10

## 2018-08-04 MED ORDER — FENTANYL CITRATE (PF) 100 MCG/2ML IJ SOLN
INTRAMUSCULAR | Status: AC
  Filled 2018-08-04: qty 2

## 2018-08-04 MED ORDER — ONDANSETRON HCL 4 MG/2ML IJ SOLN
INTRAMUSCULAR | Status: AC
  Filled 2018-08-04: qty 2

## 2018-08-04 MED ORDER — LIDOCAINE 2% (20 MG/ML) 5 ML SYRINGE
INTRAMUSCULAR | Status: DC | PRN
  Administered 2018-08-04: 100 mg via INTRAVENOUS

## 2018-08-04 MED ORDER — DEXTROSE 5 % IV SOLN
3.0000 g | INTRAVENOUS | Status: DC
  Filled 2018-08-04 (×2): qty 3000

## 2018-08-04 MED ORDER — FENTANYL CITRATE (PF) 100 MCG/2ML IJ SOLN
INTRAMUSCULAR | Status: DC | PRN
  Administered 2018-08-04: 25 ug via INTRAVENOUS
  Administered 2018-08-04: 150 ug via INTRAVENOUS

## 2018-08-04 MED ORDER — ACETAMINOPHEN 500 MG PO TABS
ORAL_TABLET | ORAL | Status: AC
  Administered 2018-08-04: 1000 mg
  Filled 2018-08-04: qty 2

## 2018-08-04 MED ORDER — ROCURONIUM BROMIDE 100 MG/10ML IV SOLN
INTRAVENOUS | Status: DC | PRN
  Administered 2018-08-04: 30 mg via INTRAVENOUS

## 2018-08-04 MED ORDER — LIDOCAINE 2% (20 MG/ML) 5 ML SYRINGE
INTRAMUSCULAR | Status: AC
  Filled 2018-08-04: qty 5

## 2018-08-04 MED ORDER — HYDROCODONE-ACETAMINOPHEN 5-325 MG PO TABS: 1.0000 | ORAL_TABLET | Freq: Four times a day (QID) | ORAL | 0 refills | Status: DC | PRN

## 2018-08-04 MED ORDER — CELECOXIB 200 MG PO CAPS: 200.0000 mg | ORAL_CAPSULE | ORAL | Status: DC

## 2018-08-04 SURGICAL SUPPLY — 49 items
ADH SKN CLS APL DERMABOND .7 (GAUZE/BANDAGES/DRESSINGS) ×1
APPLIER CLIP 9.375 MED OPEN (MISCELLANEOUS) ×3
APR CLP MED 9.3 20 MLT OPN (MISCELLANEOUS) ×1
BINDER BREAST 3XL (GAUZE/BANDAGES/DRESSINGS) ×2 IMPLANT
BLADE SURG 15 STRL LF DISP TIS (BLADE) ×1 IMPLANT
BLADE SURG 15 STRL SS (BLADE) ×3
CANISTER SUCT 3000ML PPV (MISCELLANEOUS) ×3 IMPLANT
CHLORAPREP W/TINT 26ML (MISCELLANEOUS) ×3 IMPLANT
CLIP APPLIE 9.375 MED OPEN (MISCELLANEOUS) ×1 IMPLANT
COVER PROBE W GEL 5X96 (DRAPES) ×3 IMPLANT
COVER SURGICAL LIGHT HANDLE (MISCELLANEOUS) ×3 IMPLANT
COVER WAND RF STERILE (DRAPES) ×3 IMPLANT
DERMABOND ADVANCED (GAUZE/BANDAGES/DRESSINGS) ×2
DERMABOND ADVANCED .7 DNX12 (GAUZE/BANDAGES/DRESSINGS) ×1 IMPLANT
DEVICE DUBIN SPECIMEN MAMMOGRA (MISCELLANEOUS) ×3 IMPLANT
DRAPE CHEST BREAST 15X10 FENES (DRAPES) ×3 IMPLANT
DRAPE UTILITY XL STRL (DRAPES) ×3 IMPLANT
DRSG PAD ABDOMINAL 8X10 ST (GAUZE/BANDAGES/DRESSINGS) ×3 IMPLANT
ELECT CAUTERY BLADE 6.4 (BLADE) ×3 IMPLANT
ELECT REM PT RETURN 9FT ADLT (ELECTROSURGICAL) ×3
ELECTRODE REM PT RTRN 9FT ADLT (ELECTROSURGICAL) ×1 IMPLANT
GAUZE SPONGE 4X4 12PLY STRL (GAUZE/BANDAGES/DRESSINGS) ×2 IMPLANT
GAUZE SPONGE 4X4 12PLY STRL LF (GAUZE/BANDAGES/DRESSINGS) ×3 IMPLANT
GLOVE EUDERMIC 7 POWDERFREE (GLOVE) ×6 IMPLANT
GOWN STRL REUS W/ TWL LRG LVL3 (GOWN DISPOSABLE) ×1 IMPLANT
GOWN STRL REUS W/ TWL XL LVL3 (GOWN DISPOSABLE) ×1 IMPLANT
GOWN STRL REUS W/TWL LRG LVL3 (GOWN DISPOSABLE) ×3
GOWN STRL REUS W/TWL XL LVL3 (GOWN DISPOSABLE) ×3
ILLUMINATOR WAVEGUIDE N/F (MISCELLANEOUS) IMPLANT
KIT BASIN OR (CUSTOM PROCEDURE TRAY) ×3 IMPLANT
KIT MARKER MARGIN INK (KITS) ×3 IMPLANT
LIGHT WAVEGUIDE WIDE FLAT (MISCELLANEOUS) IMPLANT
NDL HYPO 25GX1X1/2 BEV (NEEDLE) ×1 IMPLANT
NEEDLE HYPO 25GX1X1/2 BEV (NEEDLE) ×3 IMPLANT
NS IRRIG 1000ML POUR BTL (IV SOLUTION) ×3 IMPLANT
PACK SURGICAL SETUP 50X90 (CUSTOM PROCEDURE TRAY) ×3 IMPLANT
PENCIL BUTTON HOLSTER BLD 10FT (ELECTRODE) ×3 IMPLANT
SPONGE LAP 18X18 RF (DISPOSABLE) ×2 IMPLANT
SPONGE LAP 4X18 RFD (DISPOSABLE) ×3 IMPLANT
SUT MNCRL AB 4-0 PS2 18 (SUTURE) ×3 IMPLANT
SUT SILK 2 0 SH (SUTURE) ×3 IMPLANT
SUT VIC AB 3-0 SH 18 (SUTURE) ×3 IMPLANT
SYR BULB 3OZ (MISCELLANEOUS) ×3 IMPLANT
SYR CONTROL 10ML LL (SYRINGE) ×3 IMPLANT
TOWEL OR 17X24 6PK STRL BLUE (TOWEL DISPOSABLE) ×3 IMPLANT
TOWEL OR 17X26 10 PK STRL BLUE (TOWEL DISPOSABLE) ×3 IMPLANT
TUBE CONNECTING 12'X1/4 (SUCTIONS) ×1
TUBE CONNECTING 12X1/4 (SUCTIONS) ×2 IMPLANT
YANKAUER SUCT BULB TIP NO VENT (SUCTIONS) ×3 IMPLANT

## 2018-08-04 NOTE — Discharge Instructions (Signed)
[facility] °Office Phone Number [phone] ° °BREAST BIOPSY/ PARTIAL MASTECTOMY: POST OP INSTRUCTIONS ° °Always review your discharge instruction sheet given to you by the facility where your surgery was performed. ° °IF YOU HAVE DISABILITY OR FAMILY LEAVE FORMS, YOU MUST BRING THEM TO THE OFFICE FOR PROCESSING.  DO NOT GIVE THEM TO YOUR DOCTOR. ° °1. A prescription for pain medication may be given to you upon discharge.  Take your pain medication as prescribed, if needed.  If narcotic pain medicine is not needed, then you may take acetaminophen (Tylenol) or ibuprofen (Advil) as needed. °2. Take your usually prescribed medications unless otherwise directed °3. If you need a refill on your pain medication, please contact your pharmacy.  They will contact our office to request authorization.  Prescriptions will not be filled after 5pm or on week-ends. °4. You should eat very light the first 24 hours after surgery, such as soup, crackers, pudding, etc.  Resume your normal diet the day after surgery. °5. Most patients will experience some swelling and bruising in the breast.  Ice packs and a good support bra will help.  Swelling and bruising can take several days to resolve.  °6. It is common to experience some constipation if taking pain medication after surgery.  Increasing fluid intake and taking a stool softener will usually help or prevent this problem from occurring.  A mild laxative (Milk of Magnesia or Miralax) should be taken according to package directions if there are no bowel movements after 48 hours. °7. Unless discharge instructions indicate otherwise, you may remove your bandages 24-48 hours after surgery, and you may shower at that time.  You may have steri-strips (small skin tapes) in place directly over the incision.  These strips should be left on the skin for 7-10 days.  If your surgeon used skin glue on the incision, you may shower in 24 hours.  The glue will flake off over the  next 2-3 weeks.  Any sutures or staples will be removed at the office during your follow-up visit. °8. ACTIVITIES:  You may resume regular daily activities (gradually increasing) beginning the next day.  Wearing a good support bra or sports bra minimizes pain and swelling.  You may have sexual intercourse when it is comfortable. °a. You may drive when you no longer are taking prescription pain medication, you can comfortably wear a seatbelt, and you can safely maneuver your car and apply brakes. °b. RETURN TO WORK:  ______________________________________________________________________________________ °9. You should see your doctor in the office for a follow-up appointment approximately two weeks after your surgery.  Your doctor’s nurse will typically make your follow-up appointment when she calls you with your pathology report.  Expect your pathology report 2-3 business days after your surgery.  You may call to check if you do not hear from us after three days. °10. OTHER INSTRUCTIONS: _______________________________________________________________________________________________ _____________________________________________________________________________________________________________________________________ °_____________________________________________________________________________________________________________________________________ °_____________________________________________________________________________________________________________________________________ ° °WHEN TO CALL YOUR DOCTOR: °1. Fever over 101.0 °2. Nausea and/or vomiting. °3. Extreme swelling or bruising. °4. Continued bleeding from incision. °5. Increased pain, redness, or drainage from the incision. ° °The clinic staff is available to answer your questions during regular business hours.  Please don’t hesitate to call and ask to speak to one of the nurses for clinical concerns.  If you have a medical emergency, go to the nearest  emergency room or call 911.  A surgeon from [facility] is always on call at the hospital. ° °For further questions, please visit [email or web address]  °

## 2018-08-04 NOTE — Transfer of Care (Signed)
Immediate Anesthesia Transfer of Care Note  Patient: Amy Cardenas  Procedure(s) Performed: RADIOACTIVE SEED GUIDED RIGHT BREAST LUMPECTOMY (Right Breast)  Patient Location: PACU  Anesthesia Type:General  Level of Consciousness: awake, alert  and oriented  Airway & Oxygen Therapy: Patient Spontanous Breathing and Patient connected to face mask oxygen  Post-op Assessment: Report given to RN and Post -op Vital signs reviewed and stable  Post vital signs: Reviewed and stable  Last Vitals:  Vitals Value Taken Time  BP 151/93 08/04/2018 12:42 PM  Temp    Pulse 92 08/04/2018 12:44 PM  Resp 17 08/04/2018 12:44 PM  SpO2 99 % 08/04/2018 12:44 PM  Vitals shown include unvalidated device data.  Last Pain:  Vitals:   08/04/18 0947  TempSrc:   PainSc: 0-No pain         Complications: No apparent anesthesia complications

## 2018-08-04 NOTE — Interval H&P Note (Signed)
History and Physical Interval Note:  08/04/2018 9:47 AM  Amy Cardenas  has presented today for surgery, with the diagnosis of ABNORMAL MAMMOGRAM  The various methods of treatment have been discussed with the patient and family. After consideration of risks, benefits and other options for treatment, the patient has consented to  Procedure(s): BREAST LUMPECTOMY WITH RADIOACTIVE SEED LOCALIZATION (Right) as a surgical intervention .  The patient's history has been reviewed, patient examined, no change in status, stable for surgery.  I have reviewed the patient's chart and labs.  Questions were answered to the patient's satisfaction.     Adin Hector

## 2018-08-04 NOTE — Op Note (Signed)
Patient Name:           Amy Cardenas   Date of Surgery:        08/04/2018  Pre op Diagnosis:      Complex sclerosing lesion right breast  Post op Diagnosis:    Same  Procedure:                 Right breast lumpectomy with radioactive seed localization  Surgeon:                     Edsel Petrin. Dalbert Batman, Cardenas.D., FACS  Assistant:                      OR staff  Operative Indications:   This is a 47 year old female, she is brought to the operating room for right breast lumpectomy.  She was initially referred by Dr. Lovey Newcomer for evaluation of complex sclerosing lesion right breast, 4 o'clock position. Amy Dryer, Amy Cardenas provides primary care.      The patient thought she felt something palpable at the 6 o'clock position of her right breast behind the areola. Imaging studies were done. Category B. There were some cystic areas but there was also a small mass in the right breast at the 4 o'clock position with cystic components and internal blood flow. There were 2 borderline abnormal lymph nodes in the right axilla. Image guided biopsy of the right breast mass at 4:00 showed complex sclerosing lesion. Differential diagnosis includes papilloma read by Dr. Lyndon Cardenas. Six-month follow-up on the right axillary lymph nodes was felt to be appropriate and unless cancer was found in the breast.      Past history includes BMI 51, hypertension, non-insulin-dependent diabetes, anxiety. 2 C-sections through Pfannenstiel incision, tubal ligation. Umbilical hernia Family history reveals great aunt had ovarian cancer. Maternal aunt and maternal great aunt had breast cancer. Father had prostate cancer treated with seeds. He also had colon cancer. Maternal grandmother had colon cancer       I told her that I thought there was a small cyst behind the areola. I told her that the area at 4:00 right breast was CSL and was probably benign but there was a small chance of cancer and that excision was recommended. She  would like to do this and does not want to take any chance.    Operative Findings:       The radioactive seed was immediately beneath the dermis.  This was immediately adjacent to the original titanium marker clip and the palpable mass in the 4 o'clock position of the subareolar area.  Because of concern that we would dislodge and drop the seed, I chose to make a radially oriented ellipse which went from the base of the nipple to the edge of the areola and this worked very well.  Cosmetically look this looks very good at the end of the case  Procedure in Detail:          Following the induction of general endotracheal anesthesia the patient's right breast was prepped and draped in a sterile fashion.  Surgical timeout was performed.  Intravenous antibiotics were given.  0.5% Marcaine with epinephrine was used as a local infiltration anesthetic.      Using a marking pen I planned a radially oriented elliptical incision at the 4 o'clock position of the right breast.  The areola was very large and I did not have to go outside the areola.  The incision  was made with a knife.  The lumpectomy was performed using the neoprobe and electrocautery.  The specimen was removed and marked with silk sutures and a 6 color ink kit to orient the pathologist.  Specimen mammogram looked good containing the mass, seed, and original biopsy clip.  The specimen was sent to the pathology lab where the seed was retrieved.  Hemostasis was excellent.  The wound was irrigated.  I placed 5 metal marker clips in the walls of the lumpectomy cavity.  The lumpectomy wound was closed in 2 separate layers with interrupted 3-0 Vicryl and the skin was closed with a running subcuticular 4-0 Monocryl and Dermabond.  Dry gauze bandages and a breast binder were placed.  The patient tolerated the procedure well was taken to PACU in stable condition.  EBL 15 cc.  Counts correct.  Complications none.    Addendum: I logged onto the [employer] and  reviewed her prescription medication history     Amy Cardenas. Dalbert Batman, Cardenas.D., FACS General and Minimally Invasive Surgery Breast and Colorectal Surgery  08/04/2018 12:32 PM

## 2018-08-04 NOTE — Anesthesia Procedure Notes (Addendum)
Procedure Name: Intubation Date/Time: 08/04/2018 11:44 AM Performed by: Jearld Pies, CRNA Pre-anesthesia Checklist: Patient identified, Emergency Drugs available, Suction available and Patient being monitored Patient Re-evaluated:Patient Re-evaluated prior to induction Oxygen Delivery Method: Circle System Utilized Preoxygenation: Pre-oxygenation with 100% oxygen Induction Type: IV induction and Rapid sequence Laryngoscope Size: Glidescope and 4 Grade View: Grade II Tube type: Oral Tube size: 7.0 mm Number of attempts: 1 Airway Equipment and Method: Stylet and Oral airway Placement Confirmation: ETT inserted through vocal cords under direct vision,  positive ETCO2 and breath sounds checked- equal and bilateral Secured at: 23 cm Tube secured with: Tape Dental Injury: Teeth and Oropharynx as per pre-operative assessment  Comments: Mallampati 4, OA 1.5 - Glidescope utilized.

## 2018-08-04 NOTE — Anesthesia Preprocedure Evaluation (Addendum)
Anesthesia Evaluation  Patient identified by MRN, date of birth, ID band Patient awake    Reviewed: Allergy & Precautions, NPO status , Patient's Chart, lab work & pertinent test results  Airway Mallampati: II  TM Distance: >3 FB     Dental   Pulmonary former smoker,    breath sounds clear to auscultation       Cardiovascular hypertension, + Valvular Problems/Murmurs  Rhythm:Regular Rate:Normal     Neuro/Psych    GI/Hepatic negative GI ROS, Neg liver ROS,   Endo/Other  diabetes  Renal/GU      Musculoskeletal   Abdominal   Peds  Hematology  (+) anemia ,   Anesthesia Other Findings   Reproductive/Obstetrics                             Anesthesia Physical Anesthesia Plan  ASA: III  Anesthesia Plan: General   Post-op Pain Management:    Induction: Intravenous  PONV Risk Score and Plan: 3 and Ondansetron, Dexamethasone and Midazolam  Airway Management Planned: Oral ETT  Additional Equipment:   Intra-op Plan:   Post-operative Plan: Extubation in OR  Informed Consent: I have reviewed the patients History and Physical, chart, labs and discussed the procedure including the risks, benefits and alternatives for the proposed anesthesia with the patient or authorized representative who has indicated his/her understanding and acceptance.   Dental advisory given  Plan Discussed with: CRNA and Anesthesiologist  Anesthesia Plan Comments:        Anesthesia Quick Evaluation

## 2018-08-05 ENCOUNTER — Encounter (HOSPITAL_COMMUNITY): Payer: Self-pay | Admitting: General Surgery

## 2018-08-06 NOTE — Anesthesia Postprocedure Evaluation (Signed)
Anesthesia Post Note  Patient: Amy Cardenas  Procedure(s) Performed: RADIOACTIVE SEED GUIDED RIGHT BREAST LUMPECTOMY (Right Breast)     Patient location during evaluation: PACU Anesthesia Type: General Level of consciousness: awake Pain management: pain level controlled Vital Signs Assessment: post-procedure vital signs reviewed and stable Respiratory status: spontaneous breathing Cardiovascular status: stable Postop Assessment: no apparent nausea or vomiting Anesthetic complications: no    Last Vitals:  Vitals:   08/04/18 1445 08/04/18 1509  BP: (!) 151/102 (!) 153/96  Pulse: 87 79  Resp:    Temp:    SpO2: 96% 98%    Last Pain:  Vitals:   08/04/18 1330  TempSrc:   PainSc: Asleep                 Abundio Teuscher

## 2018-08-06 NOTE — Anesthesia Postprocedure Evaluation (Deleted)
Anesthesia Post Note  Patient: Amy Cardenas  Procedure(s) Performed: RADIOACTIVE SEED GUIDED RIGHT BREAST LUMPECTOMY (Right Breast)     Anesthesia Post Evaluation  Last Vitals:  Vitals:   08/04/18 1445 08/04/18 1509  BP: (!) 151/102 (!) 153/96  Pulse: 87 79  Resp:    Temp:    SpO2: 96% 98%    Last Pain:  Vitals:   08/04/18 1330  TempSrc:   PainSc: Asleep                 Raiven Belizaire

## 2018-08-06 NOTE — Progress Notes (Signed)
Inform patient of Pathology report,. Breast pathology benign!! Shows papilloma. Let me know you reached her.  hmi

## 2018-08-11 ENCOUNTER — Ambulatory Visit: Payer: Medicaid Other | Admitting: Physician Assistant

## 2018-08-11 ENCOUNTER — Encounter: Payer: Self-pay | Admitting: Physician Assistant

## 2018-08-11 VITALS — BP 116/80 | HR 94 | Temp 98.1°F | Ht 62.5 in | Wt 299.0 lb

## 2018-08-11 DIAGNOSIS — D649 Anemia, unspecified: Secondary | ICD-10-CM

## 2018-08-11 DIAGNOSIS — I10 Essential (primary) hypertension: Secondary | ICD-10-CM

## 2018-08-11 DIAGNOSIS — E119 Type 2 diabetes mellitus without complications: Secondary | ICD-10-CM

## 2018-08-11 MED ORDER — LOSARTAN POTASSIUM 50 MG PO TABS: 50.0000 mg | ORAL_TABLET | Freq: Every day | ORAL | 1 refills | Status: DC

## 2018-08-11 NOTE — Progress Notes (Signed)
BP 116/80 (BP Location: Left Arm, Patient Position: Sitting, Cuff Size: Large)   Pulse 94   Temp 98.1 F (36.7 C)   Ht 5' 2.5" (1.588 m)   Wt 299 lb (135.6 kg)   LMP 07/21/2018   SpO2 97%   BMI 53.82 kg/m    Subjective:    Patient ID: Amy Cardenas, female    DOB: 11/10/1970, 47 y.o.   MRN: [id number]  HPI: Amy Cardenas is a 47 y.o. female presenting on 08/11/2018 for Hypertension and Cough   HPI   Pt with dry cough- she thinks it's her lisinopril.  Otherwise she is doing well.    Relevant past medical, surgical, family and social history reviewed and updated as indicated. Interim medical history since our last visit reviewed. Allergies and medications reviewed and updated.   Current Outpatient Medications:  .  HYDROcodone-acetaminophen (NORCO) 5-325 MG tablet, Take 1-2 tablets by mouth every 6 (six) hours as needed for moderate pain or severe pain., Disp: 20 tablet, Rfl: 0 .  lisinopril (PRINIVIL,ZESTRIL) 20 MG tablet, Take 1 tablet (20 mg total) by mouth daily., Disp: 30 tablet, Rfl: 3 .  metFORMIN (GLUCOPHAGE XR) 500 MG 24 hr tablet, Take 1 tablet (500 mg total) by mouth daily with breakfast., Disp: 30 tablet, Rfl: 3 .  albuterol (PROVENTIL HFA;VENTOLIN HFA) 108 (90 Base) MCG/ACT inhaler, Inhale 1 puff into the lungs every 6 (six) hours as needed for wheezing or shortness of breath., Disp: , Rfl:    Review of Systems  Constitutional: Negative for appetite change, chills, diaphoresis, fatigue, fever and unexpected weight change.  HENT: Negative for congestion, dental problem, drooling, ear pain, facial swelling, hearing loss, mouth sores, sneezing, sore throat, trouble swallowing and voice change.   Eyes: Negative for pain, discharge, redness, itching and visual disturbance.  Respiratory: Positive for cough. Negative for choking, shortness of breath and wheezing.   Cardiovascular: Negative for chest pain, palpitations and leg swelling.  Gastrointestinal: Negative for  abdominal pain, blood in stool, constipation, diarrhea and vomiting.  Endocrine: Negative for cold intolerance, heat intolerance and polydipsia.  Genitourinary: Negative for decreased urine volume, dysuria and hematuria.  Musculoskeletal: Negative for arthralgias, back pain and gait problem.  Skin: Negative for rash.  Allergic/Immunologic: Negative for environmental allergies.  Neurological: Negative for seizures, syncope, light-headedness and headaches.  Hematological: Negative for adenopathy.  Psychiatric/Behavioral: Negative for agitation, dysphoric mood and suicidal ideas. The patient is not nervous/anxious.     Per HPI unless specifically indicated above     Objective:    BP 116/80 (BP Location: Left Arm, Patient Position: Sitting, Cuff Size: Large)   Pulse 94   Temp 98.1 F (36.7 C)   Ht 5' 2.5" (1.588 m)   Wt 299 lb (135.6 kg)   LMP 07/21/2018   SpO2 97%   BMI 53.82 kg/m   Wt Readings from Last 3 Encounters:  08/11/18 299 lb (135.6 kg)  08/04/18 296 lb (134.3 kg)  07/28/18 297 lb (134.7 kg)    Physical Exam  Constitutional: She is oriented to person, place, and time. She appears well-developed and well-nourished.  HENT:  Head: Normocephalic and atraumatic.  Neck: Neck supple.  Cardiovascular: Normal rate and regular rhythm.  Pulmonary/Chest: Effort normal and breath sounds normal.  Abdominal: Soft. Bowel sounds are normal. She exhibits no mass. There is no hepatosplenomegaly. There is no tenderness.  Musculoskeletal: She exhibits no edema.  Lymphadenopathy:    She has no cervical adenopathy.  Neurological: She is alert  and oriented to person, place, and time.  Skin: Skin is warm and dry.  Psychiatric: She has a normal mood and affect. Her behavior is normal.  Vitals reviewed.       Assessment & Plan:   Encounter Diagnoses  Name Primary?  . Essential hypertension Yes  . Diabetes mellitus without complication ([place])   . Anemia, unspecified type   . Morbid  obesity ([place])     -will check labs -discontinue lisinopril (due to cough) and rx losartan -pt to follow up 1 month to recheck bp.  RTO sooner prn

## 2018-09-08 ENCOUNTER — Ambulatory Visit: Payer: Medicaid Other | Admitting: Physician Assistant

## 2018-10-18 ENCOUNTER — Other Ambulatory Visit: Payer: Self-pay | Admitting: Physician Assistant

## 2018-11-19 ENCOUNTER — Ambulatory Visit: Payer: Medicaid Other | Admitting: Physician Assistant

## 2018-11-19 ENCOUNTER — Encounter: Payer: Self-pay | Admitting: Physician Assistant

## 2018-11-19 VITALS — BP 130/80 | HR 93 | Temp 97.9°F | Ht 62.5 in | Wt 308.5 lb

## 2018-11-19 DIAGNOSIS — E119 Type 2 diabetes mellitus without complications: Secondary | ICD-10-CM

## 2018-11-19 DIAGNOSIS — Z6841 Body Mass Index (BMI) 40.0 and over, adult: Secondary | ICD-10-CM

## 2018-11-19 DIAGNOSIS — D649 Anemia, unspecified: Secondary | ICD-10-CM

## 2018-11-19 DIAGNOSIS — I10 Essential (primary) hypertension: Secondary | ICD-10-CM

## 2018-11-19 DIAGNOSIS — E785 Hyperlipidemia, unspecified: Secondary | ICD-10-CM

## 2018-11-19 MED ORDER — LOSARTAN POTASSIUM 50 MG PO TABS: 50.0000 mg | ORAL_TABLET | Freq: Every day | ORAL | 0 refills | Status: DC

## 2018-11-19 MED ORDER — METFORMIN HCL ER 500 MG PO TB24: ORAL_TABLET | ORAL | 0 refills | Status: DC

## 2018-11-19 NOTE — Progress Notes (Signed)
BP 130/80 (BP Location: Left Arm, Patient Position: Sitting, Cuff Size: Large)   Pulse 93   Temp 97.9 F (36.6 C)   Ht 5' 2.5" (1.588 m)   Wt (!) 308 lb 8 oz (139.9 kg)   SpO2 98%   BMI 55.53 kg/m    Subjective:    Patient ID: Amy Cardenas, female    DOB: 02/10/1971, 48 y.o.   MRN: [id number]  HPI: Amy Cardenas is a 48 y.o. female presenting on 11/19/2018 for Hypertension and Diabetes   HPI  Her cough is gone (with change from lisinoril to losartan)  Pt did not get her labs drawn after last OV as instructed  Pt is feeling well.  She is in school and is hoping to start taking better care of herself.    Pt says she had a lumpectomy in November but that it turned out to be benign.  Relevant past medical, surgical, family and social history reviewed and updated as indicated. Interim medical history since our last visit reviewed. Allergies and medications reviewed and updated.   Current Outpatient Medications:  .  losartan (COZAAR) 50 MG tablet, Take 1 tablet (50 mg total) by mouth daily., Disp: 30 tablet, Rfl: 1 .  metFORMIN (GLUCOPHAGE-XR) 500 MG 24 hr tablet, TAKE 1 TABLET BY MOUTH ONCE DAILY WITH BREAKFAST, Disp: 30 tablet, Rfl: 0 .  Multiple Vitamins-Minerals (HAIR VITAMINS PO), Take by mouth., Disp: , Rfl:  .  albuterol (PROVENTIL HFA;VENTOLIN HFA) 108 (90 Base) MCG/ACT inhaler, Inhale 1 puff into the lungs every 6 (six) hours as needed for wheezing or shortness of breath., Disp: , Rfl:  .  HYDROcodone-acetaminophen (NORCO) 5-325 MG tablet, Take 1-2 tablets by mouth every 6 (six) hours as needed for moderate pain or severe pain. (Patient not taking: Reported on 11/19/2018), Disp: 20 tablet, Rfl: 0    Review of Systems  Constitutional: Positive for fatigue. Negative for appetite change, chills, diaphoresis, fever and unexpected weight change.  HENT: Negative for congestion, dental problem, drooling, ear pain, facial swelling, hearing loss, mouth sores, sneezing, sore  throat, trouble swallowing and voice change.   Eyes: Negative for pain, discharge, redness, itching and visual disturbance.  Respiratory: Negative for cough, choking, shortness of breath and wheezing.   Cardiovascular: Negative for chest pain, palpitations and leg swelling.  Gastrointestinal: Negative for abdominal pain, blood in stool, constipation, diarrhea and vomiting.  Endocrine: Negative for cold intolerance, heat intolerance and polydipsia.  Genitourinary: Negative for decreased urine volume, dysuria and hematuria.  Musculoskeletal: Negative for arthralgias, back pain and gait problem.  Skin: Negative for rash.  Allergic/Immunologic: Negative for environmental allergies.  Neurological: Negative for seizures, syncope, light-headedness and headaches.  Hematological: Negative for adenopathy.  Psychiatric/Behavioral: Negative for agitation, dysphoric mood and suicidal ideas. The patient is not nervous/anxious.     Per HPI unless specifically indicated above     Objective:    BP 130/80 (BP Location: Left Arm, Patient Position: Sitting, Cuff Size: Large)   Pulse 93   Temp 97.9 F (36.6 C)   Ht 5' 2.5" (1.588 m)   Wt (!) 308 lb 8 oz (139.9 kg)   SpO2 98%   BMI 55.53 kg/m   Wt Readings from Last 3 Encounters:  11/19/18 (!) 308 lb 8 oz (139.9 kg)  08/11/18 299 lb (135.6 kg)  08/04/18 296 lb (134.3 kg)    Physical Exam Vitals signs reviewed.  Constitutional:      Appearance: She is well-developed.  HENT:  Head: Normocephalic and atraumatic.  Neck:     Musculoskeletal: Neck supple.  Cardiovascular:     Rate and Rhythm: Normal rate and regular rhythm.  Pulmonary:     Effort: Pulmonary effort is normal.     Breath sounds: Normal breath sounds.  Abdominal:     General: Bowel sounds are normal.     Palpations: Abdomen is soft. There is no mass.     Tenderness: There is no abdominal tenderness.  Lymphadenopathy:     Cervical: No cervical adenopathy.  Skin:    General:  Skin is warm and dry.  Neurological:     Mental Status: She is alert and oriented to person, place, and time.  Psychiatric:        Behavior: Behavior normal.         Assessment & Plan:   Encounter Diagnoses  Name Primary?  . Essential hypertension Yes  . Diabetes mellitus without complication ([place])   . Anemia, unspecified type   . Hyperlipidemia, unspecified hyperlipidemia type   . BMI 50.0-59.9, adult ([place])     -pt to Get labs drawn tomorrow morning -No changes to meds today -Counseled on weight loss with healthy diet and regular exercise -Pt to follow up 3 months.  RTO sooner prn

## 2018-11-20 ENCOUNTER — Other Ambulatory Visit (HOSPITAL_COMMUNITY)
Admission: RE | Admit: 2018-11-20 | Discharge: 2018-11-20 | Disposition: A | Discharge status: Home / Self Care | Payer: Medicaid Other | Source: Ambulatory Visit | Attending: Physician Assistant | Admitting: Physician Assistant

## 2018-11-20 DIAGNOSIS — I10 Essential (primary) hypertension: Secondary | ICD-10-CM | POA: Insufficient documentation

## 2018-11-20 DIAGNOSIS — E119 Type 2 diabetes mellitus without complications: Secondary | ICD-10-CM | POA: Insufficient documentation

## 2018-11-20 DIAGNOSIS — E785 Hyperlipidemia, unspecified: Secondary | ICD-10-CM

## 2018-11-20 DIAGNOSIS — D649 Anemia, unspecified: Secondary | ICD-10-CM

## 2018-11-20 LAB — COMPREHENSIVE METABOLIC PANEL
ALK PHOS: 53 U/L (ref 38–126)
ALT: 17 U/L (ref 0–44)
AST: 16 U/L (ref 15–41)
Albumin: 3.9 g/dL (ref 3.5–5.0)
Anion gap: 8 (ref 5–15)
BUN: 14 mg/dL (ref 6–20)
CO2: 26 mmol/L (ref 22–32)
Calcium: 8.7 mg/dL — ABNORMAL LOW (ref 8.9–10.3)
Chloride: 106 mmol/L (ref 98–111)
Creatinine, Ser: 0.82 mg/dL (ref 0.44–1.00)
GFR calc Af Amer: >60 mL/min (ref >60)
GFR calc non Af Amer: >60 mL/min (ref >60)
Glucose, Bld: 104 mg/dL — ABNORMAL HIGH (ref 70–99)
Potassium: 3.8 mmol/L (ref 3.5–5.1)
Sodium: 140 mmol/L (ref 135–145)
Total Bilirubin: 0.7 mg/dL (ref 0.3–1.2)
Total Protein: 7.3 g/dL (ref 6.5–8.1)

## 2018-11-20 LAB — CBC
HCT: 33.2 % — ABNORMAL LOW (ref 36.0–46.0)
Hemoglobin: 9.5 g/dL — ABNORMAL LOW (ref 12.0–15.0)
MCH: 21.2 pg — ABNORMAL LOW (ref 26.0–34.0)
MCHC: 28.6 g/dL — ABNORMAL LOW (ref 30.0–36.0)
MCV: 73.9 fL — ABNORMAL LOW (ref 80.0–100.0)
Platelets: 387 10*3/uL (ref 150–400)
RBC: 4.49 MIL/uL (ref 3.87–5.11)
RDW: 17.3 % — ABNORMAL HIGH (ref 11.5–15.5)
WBC: 8.2 10*3/uL (ref 4.0–10.5)
nRBC: 0 % (ref 0.0–0.2)

## 2018-11-20 LAB — LIPID PANEL
Cholesterol: 163 mg/dL (ref 0–200)
HDL: 35 mg/dL — ABNORMAL LOW (ref >40)
LDL Cholesterol: 102 mg/dL — ABNORMAL HIGH (ref 0–99)
Total CHOL/HDL Ratio: 4.7 RATIO
Triglycerides: 132 mg/dL (ref <150)
VLDL: 26 mg/dL (ref 0–40)

## 2018-11-20 LAB — HEMOGLOBIN A1C
Hgb A1c MFr Bld: 6.8 % — ABNORMAL HIGH (ref 4.8–5.6)
Mean Plasma Glucose: 148.46 mg/dL

## 2018-11-21 LAB — MICROALBUMIN, URINE: MICROALB UR: 11.2 ug/mL — AB

## 2018-11-23 ENCOUNTER — Other Ambulatory Visit: Payer: Self-pay | Admitting: Physician Assistant

## 2018-11-23 DIAGNOSIS — E785 Hyperlipidemia, unspecified: Secondary | ICD-10-CM

## 2018-11-23 DIAGNOSIS — I10 Essential (primary) hypertension: Secondary | ICD-10-CM

## 2018-11-23 DIAGNOSIS — E119 Type 2 diabetes mellitus without complications: Secondary | ICD-10-CM

## 2018-11-23 DIAGNOSIS — D649 Anemia, unspecified: Secondary | ICD-10-CM

## 2018-11-30 ENCOUNTER — Ambulatory Visit: Payer: Medicaid Other | Admitting: Physician Assistant

## 2018-12-10 ENCOUNTER — Ambulatory Visit: Payer: Medicaid Other | Admitting: Physician Assistant

## 2018-12-10 DIAGNOSIS — R059 Cough, unspecified: Secondary | ICD-10-CM

## 2018-12-10 DIAGNOSIS — R05 Cough: Secondary | ICD-10-CM

## 2018-12-10 NOTE — Progress Notes (Signed)
   There were no vitals taken for this visit.   Subjective:    Patient ID: Amy R Volner, female    DOB: 1971-01-09, 48 y.o.   MRN: [id number]  HPI: Amy Cardenas is a 48 y.o. female presenting on 12/10/2018 for Cough   HPI   This is a telemedicine visit due to coronavirus pandemic  Pt went to urgent care on 3/9 because she didn't want to wait for her appointment to come here.  She was dx with sinusitis and bronchitis.  She was prescribed prednisone, tessalon, zpaack and she was given a shot.  She says she had a flu test that was negative.  Pt says at that time she had some chills but no fever.    Pt says she is feeling much better now but she is just concerned because the cough has not gone comepletely away yet.  She is not having any fever and she is not having SOB.  She has an inhaler that she says helps some.     Relevant past medical, surgical, family and social history reviewed and updated as indicated. Interim medical history since our last visit reviewed. Allergies and medications reviewed and updated.  Review of Systems  Per HPI unless specifically indicated above     Objective:    There were no vitals taken for this visit.  Wt Readings from Last 3 Encounters:  11/19/18 (!) 308 lb 8 oz (139.9 kg)  08/11/18 299 lb (135.6 kg)  08/04/18 296 lb (134.3 kg)    Physical Exam Pulmonary:     Effort: Pulmonary effort is normal. No respiratory distress.  Neurological:     Mental Status: She is alert and oriented to person, place, and time.  Psychiatric:        Mood and Affect: Mood normal.         Assessment & Plan:    Encounter Diagnosis  Name Primary?  . Cough Yes    -pt counseled to use her inhaler and tessalon -Rest .  Fluids. -encouraged pt to stay home to avoid potentially getting others sick -she is to follow up as scheduled.  Contact office for any problems.

## 2018-12-15 ENCOUNTER — Telehealth: Payer: Self-pay | Admitting: Student

## 2018-12-15 ENCOUNTER — Other Ambulatory Visit: Payer: Self-pay | Admitting: Physician Assistant

## 2018-12-15 MED ORDER — BENZONATATE 100 MG PO CAPS: ORAL_CAPSULE | ORAL | 3 refills | Status: DC

## 2018-12-15 NOTE — Telephone Encounter (Signed)
Pt called requesting more antibiotic. Pt c/o cough and green phlegm. Pt states she is still taking tessalon for cough which helps for a while. Pt states she has finished antibiotic given to her by urgent care on 11-30-18 (zpack).  LPN explained to patient that antibiotic given to her should have cleared any bacterial infection. LPN explained to patient that symptoms are most likely viral and will need to run it's course.   LPN recommended for patient to continue taking tessalon as needed for cough and could also try OTC robitussin for cough. LPN recommended for patient to try OTC mucinex for phlegm. Pt verbalized understanding.  Pt states she has four tessalon perles left and asks for refill rx to be sent to [facility] in [place].  LPN advised for patient to stay home and to separate herself as much as possible from other people in the home. Pt was explained to isolate herself as if she has coronavirus. Pt verbalized understanding.

## 2018-12-16 ENCOUNTER — Other Ambulatory Visit: Payer: Self-pay | Admitting: Physician Assistant

## 2019-02-16 ENCOUNTER — Encounter: Payer: Self-pay | Admitting: Physician Assistant

## 2019-02-16 ENCOUNTER — Ambulatory Visit: Payer: Medicaid Other | Admitting: Physician Assistant

## 2019-02-16 DIAGNOSIS — E785 Hyperlipidemia, unspecified: Secondary | ICD-10-CM

## 2019-02-16 DIAGNOSIS — D649 Anemia, unspecified: Secondary | ICD-10-CM

## 2019-02-16 DIAGNOSIS — I10 Essential (primary) hypertension: Secondary | ICD-10-CM

## 2019-02-16 DIAGNOSIS — E119 Type 2 diabetes mellitus without complications: Secondary | ICD-10-CM

## 2019-02-16 MED ORDER — METFORMIN HCL ER 500 MG PO TB24: ORAL_TABLET | ORAL | 4 refills | Status: DC

## 2019-02-16 MED ORDER — LOSARTAN POTASSIUM 50 MG PO TABS: 50.0000 mg | ORAL_TABLET | Freq: Every day | ORAL | 4 refills | Status: DC

## 2019-02-16 NOTE — Progress Notes (Signed)
   There were no vitals taken for this visit.   Subjective:    Patient ID: Amy Cardenas, female    DOB: 11/30/1970, 48 y.o.   MRN: [id number]  HPI: Amy Cardenas is a 48 y.o. female presenting on 02/16/2019 for No chief complaint on file.   HPI    This is a telemedicine visit due to coronavirus pandemic.  It is via Telephone as pt cannot get the video on her phone to work  I connected with  Amy Cardenas on 02/16/19 by a video enabled telemedicine application and verified that I am speaking with the correct person using two identifiers.   I discussed the limitations of evaluation and management by telemedicine. The patient expressed understanding and agreed to proceed.  Pt is at home.  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  She takes something over the counter that helps when needed  Other than that she is doing well and has no complaints  Relevant past medical, surgical, family and social history reviewed and updated as indicated. Interim medical history since our last visit reviewed. Allergies and medications reviewed and updated.   Current Outpatient Medications:  .  Ferrous Sulfate (IRON PO), Take by mouth., Disp: , Rfl:  .  losartan (COZAAR) 50 MG tablet, Take 1 tablet by mouth once daily, Disp: 30 tablet, Rfl: 2 .  metFORMIN (GLUCOPHAGE-XR) 500 MG 24 hr tablet, Take 1 tablet by mouth once daily with breakfast, Disp: 30 tablet, Rfl: 2 .  Multiple Vitamins-Minerals (HAIR VITAMINS PO), Take by mouth., Disp: , Rfl:  .  albuterol (PROVENTIL HFA;VENTOLIN HFA) 108 (90 Base) MCG/ACT inhaler, Inhale 1 puff into the lungs every 6 (six) hours as needed for wheezing or shortness of breath., Disp: , Rfl:      Review of Systems  Per HPI unless specifically indicated above     Objective:    There were no vitals taken for this visit.  Wt  Readings from Last 3 Encounters:  11/19/18 (!) 308 lb 8 oz (139.9 kg)  08/11/18 299 lb (135.6 kg)  08/04/18 296 lb (134.3 kg)    Physical Exam Pulmonary:     Effort: Pulmonary effort is normal. No respiratory distress.  Neurological:     Mental Status: She is alert and oriented to person, place, and time.  Psychiatric:        Mood and Affect: Mood normal.           Assessment & Plan:   Encounter Diagnoses  Name Primary?  . Diabetes mellitus without complication ([place]) Yes  . Essential hypertension   . Anemia, unspecified type   . Hyperlipidemia, unspecified hyperlipidemia type      -will Update labs.  Pt to go for fasting labs this week and she will be called with results -pt to continue current medications.  refills sent -pt to follow up in 3 months.  She is to contact office sooner prn

## 2019-04-06 ENCOUNTER — Other Ambulatory Visit: Payer: Self-pay | Admitting: Obstetrics and Gynecology

## 2019-04-06 DIAGNOSIS — R2231 Localized swelling, mass and lump, right upper limb: Secondary | ICD-10-CM

## 2019-04-19 ENCOUNTER — Other Ambulatory Visit: Payer: Self-pay

## 2019-04-19 ENCOUNTER — Other Ambulatory Visit: Payer: Self-pay | Admitting: Physician Assistant

## 2019-04-19 ENCOUNTER — Other Ambulatory Visit (HOSPITAL_COMMUNITY)
Admission: RE | Admit: 2019-04-19 | Discharge: 2019-04-19 | Disposition: A | Discharge status: Home / Self Care | Payer: Medicaid Other | Source: Ambulatory Visit | Attending: Physician Assistant | Admitting: Physician Assistant

## 2019-04-19 DIAGNOSIS — D649 Anemia, unspecified: Secondary | ICD-10-CM | POA: Insufficient documentation

## 2019-04-19 DIAGNOSIS — I10 Essential (primary) hypertension: Secondary | ICD-10-CM

## 2019-04-19 DIAGNOSIS — E785 Hyperlipidemia, unspecified: Secondary | ICD-10-CM | POA: Insufficient documentation

## 2019-04-19 DIAGNOSIS — E119 Type 2 diabetes mellitus without complications: Secondary | ICD-10-CM

## 2019-04-19 LAB — COMPREHENSIVE METABOLIC PANEL
ALT: 15 U/L (ref 0–44)
AST: 15 U/L (ref 15–41)
Albumin: 3.7 g/dL (ref 3.5–5.0)
Alkaline Phosphatase: 50 U/L (ref 38–126)
Anion gap: 9 (ref 5–15)
BUN: 13 mg/dL (ref 6–20)
CO2: 27 mmol/L (ref 22–32)
Calcium: 9.3 mg/dL (ref 8.9–10.3)
Chloride: 104 mmol/L (ref 98–111)
Creatinine, Ser: 0.83 mg/dL (ref 0.44–1.00)
GFR calc Af Amer: >60 mL/min (ref >60)
GFR calc non Af Amer: >60 mL/min (ref >60)
Glucose, Bld: 131 mg/dL — ABNORMAL HIGH (ref 70–99)
Potassium: 3.8 mmol/L (ref 3.5–5.1)
Sodium: 140 mmol/L (ref 135–145)
Total Bilirubin: 0.6 mg/dL (ref 0.3–1.2)
Total Protein: 7.4 g/dL (ref 6.5–8.1)

## 2019-04-19 LAB — LIPID PANEL
Cholesterol: 162 mg/dL (ref 0–200)
HDL: 32 mg/dL — ABNORMAL LOW (ref >40)
LDL Cholesterol: 111 mg/dL — ABNORMAL HIGH (ref 0–99)
Total CHOL/HDL Ratio: 5.1 RATIO
Triglycerides: 97 mg/dL (ref <150)
VLDL: 19 mg/dL (ref 0–40)

## 2019-04-19 LAB — HEMOGLOBIN AND HEMATOCRIT, BLOOD
HCT: 33.9 % — ABNORMAL LOW (ref 36.0–46.0)
Hemoglobin: 9.8 g/dL — ABNORMAL LOW (ref 12.0–15.0)

## 2019-04-19 LAB — HEMOGLOBIN A1C
Hgb A1c MFr Bld: 6.9 % — ABNORMAL HIGH (ref 4.8–5.6)
Mean Plasma Glucose: 151.33 mg/dL

## 2019-04-20 ENCOUNTER — Ambulatory Visit
Admission: RE | Admit: 2019-04-20 | Discharge: 2019-04-20 | Disposition: A | Discharge status: Home / Self Care | Payer: Medicaid Other | Source: Ambulatory Visit | Attending: Obstetrics and Gynecology | Admitting: Obstetrics and Gynecology

## 2019-04-20 ENCOUNTER — Other Ambulatory Visit: Payer: Self-pay | Admitting: Obstetrics and Gynecology

## 2019-04-20 DIAGNOSIS — R2231 Localized swelling, mass and lump, right upper limb: Secondary | ICD-10-CM

## 2019-04-22 ENCOUNTER — Other Ambulatory Visit: Payer: Self-pay

## 2019-04-22 ENCOUNTER — Ambulatory Visit: Payer: Medicaid Other | Admitting: Physician Assistant

## 2019-04-22 ENCOUNTER — Encounter: Payer: Self-pay | Admitting: Physician Assistant

## 2019-04-22 VITALS — BP 152/88 | HR 92 | Temp 98.2°F | Ht 61.0 in | Wt 309.0 lb

## 2019-04-22 DIAGNOSIS — E119 Type 2 diabetes mellitus without complications: Secondary | ICD-10-CM

## 2019-04-22 DIAGNOSIS — Z6841 Body Mass Index (BMI) 40.0 and over, adult: Secondary | ICD-10-CM

## 2019-04-22 DIAGNOSIS — Z1211 Encounter for screening for malignant neoplasm of colon: Secondary | ICD-10-CM

## 2019-04-22 DIAGNOSIS — Z5320 Procedure and treatment not carried out because of patient's decision for unspecified reasons: Secondary | ICD-10-CM

## 2019-04-22 DIAGNOSIS — E785 Hyperlipidemia, unspecified: Secondary | ICD-10-CM

## 2019-04-22 DIAGNOSIS — D649 Anemia, unspecified: Secondary | ICD-10-CM

## 2019-04-22 DIAGNOSIS — I10 Essential (primary) hypertension: Secondary | ICD-10-CM

## 2019-04-22 DIAGNOSIS — Z020 Encounter for examination for admission to educational institution: Secondary | ICD-10-CM

## 2019-04-22 LAB — POCT URINALYSIS DIPSTICK
Bilirubin, UA: NEGATIVE
Blood, UA: NEGATIVE
Glucose, UA: NEGATIVE
Ketones, UA: NEGATIVE
Leukocytes, UA: NEGATIVE
Nitrite, UA: NEGATIVE
Protein, UA: NEGATIVE
Spec Grav, UA: 1.015 (ref 1.010–1.025)
Urobilinogen, UA: 1 E.U./dL
pH, UA: 6 (ref 5.0–8.0)

## 2019-04-22 MED ORDER — GLIPIZIDE 5 MG PO TABS: 5.0000 mg | ORAL_TABLET | Freq: Every day | ORAL | 4 refills | Status: DC

## 2019-04-22 MED ORDER — LOSARTAN POTASSIUM 100 MG PO TABS: 100.0000 mg | ORAL_TABLET | Freq: Every day | ORAL | 3 refills | Status: DC

## 2019-04-22 NOTE — Patient Instructions (Signed)

## 2019-04-22 NOTE — Progress Notes (Signed)
BP 140/86   Pulse 92   Temp 98.2 F (36.8 C)   Ht 5\' 1"  (1.549 m)   Wt (!) 309 lb (140.2 kg)   SpO2 98%   BMI 58.39 kg/m    Subjective:    Patient ID: Amy Cardenas, female    DOB: 02-02-1971, 48 y.o.   MRN: [id number]  HPI: Amy Cardenas is a 48 y.o. female presenting on 04/22/2019 for No chief complaint on file.   HPI    pt had a negative covid 19 screening questionnaire   Pt stopped her metformin about a month ago because she got a lettter saying it caused cancer.  Pt states her menses are irregular.    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    She is feeling well today and has no complaints.    Relevant past medical, surgical, family and social history reviewed and updated as indicated. Interim medical history since our last visit reviewed. Allergies and medications reviewed and updated.   Current Outpatient Medications:  .  albuterol (PROVENTIL HFA;VENTOLIN HFA) 108 (90 Base) MCG/ACT inhaler, Inhale 1 puff into the lungs every 6 (six) hours as needed for wheezing or shortness of breath., Disp: , Rfl:  .  Ascorbic Acid (VITA-C PO), Take 2 tablets by mouth daily., Disp: , Rfl:  .  Cholecalciferol (VITAMIN D3 PO), Take 50 mcg by mouth daily., Disp: , Rfl:  .  Cyanocobalamin (VITAMIN B-12 PO), Take 1 tablet by mouth daily., Disp: , Rfl:  .  Ferrous Sulfate (IRON PO), Take by mouth., Disp: , Rfl:  .  Ferrous Sulfate (IRON) 325 (65 Fe) MG TABS, Take 1 tablet by mouth daily., Disp: , Rfl:  .  losartan (COZAAR) 50 MG tablet, Take 1 tablet (50 mg total) by mouth daily., Disp: 30 tablet, Rfl: 4 .  MAGNESIUM PO, Take 1 tablet by mouth as needed., Disp: , Rfl:  .  Multiple Vitamins-Minerals (HAIR VITAMINS PO), Take by mouth., Disp: , Rfl:  .  NON FORMULARY, every morning. Vitamin and antioxidant supplement. Single serve packets poured into 16oz glass of water, Disp: , Rfl:  .  metFORMIN (GLUCOPHAGE-XR) 500 MG 24  hr tablet, 1 po qd (Patient not taking: Reported on 04/22/2019), Disp: 30 tablet, Rfl: 4  Review of Systems  Per HPI unless specifically indicated above     Objective:    BP 140/86   Pulse 92   Temp 98.2 F (36.8 C)   Ht 5\' 1"  (1.549 m)   Wt (!) 309 lb (140.2 kg)   SpO2 98%   BMI 58.39 kg/m   Wt Readings from Last 3 Encounters:  04/22/19 (!) 309 lb (140.2 kg)  11/19/18 (!) 308 lb 8 oz (139.9 kg)  08/11/18 299 lb (135.6 kg)    Physical Exam Vitals signs reviewed.  Constitutional:      General: She is not in acute distress.    Appearance: Normal appearance. She is well-developed. She is obese. She is not ill-appearing.  HENT:     Head: Normocephalic and atraumatic.  Neck:     Musculoskeletal: Neck supple.  Cardiovascular:     Rate and Rhythm: Normal rate and regular rhythm.  Pulmonary:     Effort: Pulmonary effort is normal.     Breath sounds: Normal breath sounds.  Abdominal:     General: Bowel sounds are normal.     Palpations: Abdomen is soft. There is no  mass.     Tenderness: There is no abdominal tenderness.  Musculoskeletal:     Right lower leg: No edema.     Left lower leg: No edema.  Lymphadenopathy:     Cervical: No cervical adenopathy.  Skin:    General: Skin is warm and dry.  Neurological:     Mental Status: She is alert and oriented to person, place, and time.  Psychiatric:        Attention and Perception: Attention normal.        Mood and Affect: Mood normal.        Speech: Speech normal.        Behavior: Behavior normal. Behavior is cooperative.     Results for orders placed or performed during the hospital encounter of 04/19/19  Lipid panel  Result Value Ref Range   Cholesterol 162 0 - 200 mg/dL   Triglycerides 97 <150 mg/dL   HDL 32 (L) >40 mg/dL   Total CHOL/HDL Ratio 5.1 RATIO   VLDL 19 0 - 40 mg/dL   LDL Cholesterol 111 (H) 0 - 99 mg/dL  Comprehensive metabolic panel  Result Value Ref Range   Sodium 140 135 - 145 mmol/L   Potassium  3.8 3.5 - 5.1 mmol/L   Chloride 104 98 - 111 mmol/L   CO2 27 22 - 32 mmol/L   Glucose, Bld 131 (H) 70 - 99 mg/dL   BUN 13 6 - 20 mg/dL   Creatinine, Ser 0.83 0.44 - 1.00 mg/dL   Calcium 9.3 8.9 - 10.3 mg/dL   Total Protein 7.4 6.5 - 8.1 g/dL   Albumin 3.7 3.5 - 5.0 g/dL   AST 15 15 - 41 U/L   ALT 15 0 - 44 U/L   Alkaline Phosphatase 50 38 - 126 U/L   Total Bilirubin 0.6 0.3 - 1.2 mg/dL   GFR calc non Af Amer >60 >60 mL/min   GFR calc Af Amer >60 >60 mL/min   Anion gap 9 5 - 15  Hemoglobin and hematocrit, blood  Result Value Ref Range   Hemoglobin 9.8 (L) 12.0 - 15.0 g/dL   HCT 33.9 (L) 36.0 - 46.0 %  Hemoglobin A1c  Result Value Ref Range   Hgb A1c MFr Bld 6.9 (H) 4.8 - 5.6 %   Mean Plasma Glucose 151.33 mg/dL      Assessment & Plan:    Encounter Diagnoses  Name Primary?  . School health examination Yes  . Diabetes mellitus without complication ([place])   . Essential hypertension   . Anemia, unspecified type   . Hyperlipidemia, unspecified hyperlipidemia type   . BMI 50.0-59.9, adult ([place])   . Morbid obesity ([place])   . Screening for colon cancer   . Statin declined       -reviewed labs with pt -Pt refuses metformin.  Will rx glipizide -Recommended statin for hyperlipidemia but pt declined.  Pt counseled on diet and exercise for lipids -pt was given iFOBT for colon cancer screening -refer for diabetic eye exam -school paperwork will be completed and she will be notified what titers/tests she needs -pt to follow up  In 3 months for routine care/DM, chol, htn.  She is to contact office sooner prn

## 2019-04-26 ENCOUNTER — Ambulatory Visit: Payer: Medicaid Other | Admitting: Physician Assistant

## 2019-04-26 LAB — IFOBT (OCCULT BLOOD): IFOBT: NEGATIVE

## 2019-05-20 ENCOUNTER — Encounter: Payer: Self-pay | Admitting: Physician Assistant

## 2019-05-24 ENCOUNTER — Ambulatory Visit: Payer: Medicaid Other | Admitting: Physician Assistant

## 2019-07-23 ENCOUNTER — Other Ambulatory Visit: Payer: Self-pay

## 2019-07-23 ENCOUNTER — Ambulatory Visit
Admission: RE | Admit: 2019-07-23 | Discharge: 2019-07-23 | Disposition: A | Discharge status: Home / Self Care | Payer: Medicaid Other | Source: Ambulatory Visit | Attending: Obstetrics and Gynecology | Admitting: Obstetrics and Gynecology

## 2019-07-23 ENCOUNTER — Ambulatory Visit
Admission: RE | Admit: 2019-07-23 | Discharge: 2019-07-23 | Disposition: A | Discharge status: Home / Self Care | Payer: No Typology Code available for payment source | Source: Ambulatory Visit | Attending: Obstetrics and Gynecology | Admitting: Obstetrics and Gynecology

## 2019-07-23 ENCOUNTER — Other Ambulatory Visit (HOSPITAL_COMMUNITY)
Admission: RE | Admit: 2019-07-23 | Discharge: 2019-07-23 | Disposition: A | Discharge status: Home / Self Care | Payer: Medicaid Other | Source: Ambulatory Visit | Attending: Physician Assistant | Admitting: Physician Assistant

## 2019-07-23 DIAGNOSIS — D649 Anemia, unspecified: Secondary | ICD-10-CM | POA: Insufficient documentation

## 2019-07-23 DIAGNOSIS — E785 Hyperlipidemia, unspecified: Secondary | ICD-10-CM | POA: Insufficient documentation

## 2019-07-23 DIAGNOSIS — I10 Essential (primary) hypertension: Secondary | ICD-10-CM | POA: Insufficient documentation

## 2019-07-23 DIAGNOSIS — R2231 Localized swelling, mass and lump, right upper limb: Secondary | ICD-10-CM

## 2019-07-23 DIAGNOSIS — E119 Type 2 diabetes mellitus without complications: Secondary | ICD-10-CM | POA: Insufficient documentation

## 2019-07-23 LAB — COMPREHENSIVE METABOLIC PANEL
ALT: 11 U/L (ref 0–44)
AST: 13 U/L — ABNORMAL LOW (ref 15–41)
Albumin: 3.5 g/dL (ref 3.5–5.0)
Alkaline Phosphatase: 59 U/L (ref 38–126)
Anion gap: 9 (ref 5–15)
BUN: 13 mg/dL (ref 6–20)
CO2: 27 mmol/L (ref 22–32)
Calcium: 9 mg/dL (ref 8.9–10.3)
Chloride: 102 mmol/L (ref 98–111)
Creatinine, Ser: 0.85 mg/dL (ref 0.44–1.00)
GFR calc Af Amer: >60 mL/min (ref >60)
GFR calc non Af Amer: >60 mL/min (ref >60)
Glucose, Bld: 156 mg/dL — ABNORMAL HIGH (ref 70–99)
Potassium: 3.8 mmol/L (ref 3.5–5.1)
Sodium: 138 mmol/L (ref 135–145)
Total Bilirubin: 0.7 mg/dL (ref 0.3–1.2)
Total Protein: 7.5 g/dL (ref 6.5–8.1)

## 2019-07-23 LAB — LIPID PANEL
Cholesterol: 164 mg/dL (ref 0–200)
HDL: 32 mg/dL — ABNORMAL LOW
LDL Cholesterol: 102 mg/dL — ABNORMAL HIGH (ref 0–99)
Total CHOL/HDL Ratio: 5.1 ratio
Triglycerides: 152 mg/dL — ABNORMAL HIGH
VLDL: 30 mg/dL (ref 0–40)

## 2019-07-23 LAB — HEMOGLOBIN A1C
Hgb A1c MFr Bld: 6.8 % — ABNORMAL HIGH (ref 4.8–5.6)
Mean Plasma Glucose: 148.46 mg/dL

## 2019-07-23 LAB — HEMOGLOBIN AND HEMATOCRIT, BLOOD
HCT: 34.8 % — ABNORMAL LOW (ref 36.0–46.0)
Hemoglobin: 9.9 g/dL — ABNORMAL LOW (ref 12.0–15.0)

## 2019-07-26 ENCOUNTER — Ambulatory Visit: Payer: Medicaid Other | Admitting: Physician Assistant

## 2019-07-26 ENCOUNTER — Encounter: Payer: Self-pay | Admitting: Physician Assistant

## 2019-07-26 ENCOUNTER — Other Ambulatory Visit: Payer: Self-pay

## 2019-07-26 VITALS — BP 160/90 | HR 96 | Temp 98.1°F

## 2019-07-26 DIAGNOSIS — E119 Type 2 diabetes mellitus without complications: Secondary | ICD-10-CM

## 2019-07-26 DIAGNOSIS — D649 Anemia, unspecified: Secondary | ICD-10-CM

## 2019-07-26 DIAGNOSIS — I10 Essential (primary) hypertension: Secondary | ICD-10-CM

## 2019-07-26 DIAGNOSIS — E785 Hyperlipidemia, unspecified: Secondary | ICD-10-CM

## 2019-07-26 NOTE — Progress Notes (Signed)
BP (!) 160/90   Pulse 96   Temp 98.1 F (36.7 C)   LMP 07/16/2019   SpO2 97%    Subjective:    Patient ID: Amy Cardenas, female    DOB: 04-Oct-1970, 48 y.o.   MRN: [id number]  HPI: Amy Cardenas is a 48 y.o. female presenting on 07/26/2019 for Hypertension, Diabetes, and Hyperlipidemia   HPI  Pt had a negative covid 56 screening questionnaire   Patient is a 48 year old female with history of diabetes, hypertension, dyslipidemia with patient declining to be treated with statin.  Pt has bp machine at home.  She hasn't been checking her bp.   She had a death in the family yesterday.  She says she is feeling very sad and stressed due to this.   She is doing pretty good overall, she says.    She hasn't been taking her iron pills regularly due to constipation  She is in school for respiratory therapy.   She has no other complaints today.    Relevant past medical, surgical, family and social history reviewed and updated as indicated. Interim medical history since our last visit reviewed. Allergies and medications reviewed and updated.    Current Outpatient Medications:  .  Ascorbic Acid (VITA-C PO), Take 2 tablets by mouth daily., Disp: , Rfl:  .  Cholecalciferol (VITAMIN D3 PO), Take 50 mcg by mouth daily., Disp: , Rfl:  .  Cyanocobalamin (VITAMIN B-12 PO), Take 1 tablet by mouth daily., Disp: , Rfl:  .  glipiZIDE (GLUCOTROL) 5 MG tablet, Take 1 tablet (5 mg total) by mouth daily before breakfast., Disp: 30 tablet, Rfl: 4 .  HYDROcodone-acetaminophen (NORCO) 10-325 MG tablet, Take 1 tablet by mouth every 6 (six) hours as needed., Disp: , Rfl:  .  losartan (COZAAR) 100 MG tablet, Take 1 tablet (100 mg total) by mouth daily., Disp: 30 tablet, Rfl: 3 .  albuterol (PROVENTIL HFA;VENTOLIN HFA) 108 (90 Base) MCG/ACT inhaler, Inhale 1 puff into the lungs every 6 (six) hours as needed for wheezing or shortness of breath., Disp: , Rfl:  .  Ferrous Sulfate (IRON PO), Take by mouth.,  Disp: , Rfl:  .  Ferrous Sulfate (IRON) 325 (65 Fe) MG TABS, Take 1 tablet by mouth daily., Disp: , Rfl:  .  MAGNESIUM PO, Take 1 tablet by mouth as needed., Disp: , Rfl:  .  Multiple Vitamins-Minerals (HAIR VITAMINS PO), Take by mouth., Disp: , Rfl:  .  NON FORMULARY, every morning. Vitamin and antioxidant supplement. Single serve packets poured into 16oz glass of water, Disp: , Rfl:     Review of Systems  Per HPI unless specifically indicated above     Objective:    BP (!) 160/90   Pulse 96   Temp 98.1 F (36.7 C)   LMP 07/16/2019   SpO2 97%   Wt Readings from Last 3 Encounters:  04/22/19 (!) 309 lb (140.2 kg)  11/19/18 (!) 308 lb 8 oz (139.9 kg)  08/11/18 299 lb (135.6 kg)    Physical Exam Vitals signs reviewed.  Constitutional:      General: She is not in acute distress.    Appearance: She is well-developed. She is obese. She is not ill-appearing.  HENT:     Head: Normocephalic and atraumatic.  Neck:     Musculoskeletal: Neck supple.  Cardiovascular:     Rate and Rhythm: Normal rate and regular rhythm.  Pulmonary:     Effort: Pulmonary effort is normal.  Breath sounds: Normal breath sounds.  Abdominal:     General: Bowel sounds are normal.     Palpations: Abdomen is soft. There is no mass.     Tenderness: There is no abdominal tenderness.  Musculoskeletal:     Right lower leg: Edema present.     Left lower leg: Edema present.  Lymphadenopathy:     Cervical: No cervical adenopathy.  Skin:    General: Skin is warm and dry.  Neurological:     Mental Status: She is alert and oriented to person, place, and time.  Psychiatric:        Attention and Perception: Attention normal.        Speech: Speech normal.        Behavior: Behavior normal. Behavior is cooperative.     Results for orders placed or performed during the hospital encounter of 07/23/19  Comprehensive metabolic panel  Result Value Ref Range   Sodium 138 135 - 145 mmol/L   Potassium 3.8 3.5 -  5.1 mmol/L   Chloride 102 98 - 111 mmol/L   CO2 27 22 - 32 mmol/L   Glucose, Bld 156 (H) 70 - 99 mg/dL   BUN 13 6 - 20 mg/dL   Creatinine, Ser 0.85 0.44 - 1.00 mg/dL   Calcium 9.0 8.9 - 10.3 mg/dL   Total Protein 7.5 6.5 - 8.1 g/dL   Albumin 3.5 3.5 - 5.0 g/dL   AST 13 (L) 15 - 41 U/L   ALT 11 0 - 44 U/L   Alkaline Phosphatase 59 38 - 126 U/L   Total Bilirubin 0.7 0.3 - 1.2 mg/dL   GFR calc non Af Amer >60 >60 mL/min   GFR calc Af Amer >60 >60 mL/min   Anion gap 9 5 - 15  Lipid panel  Result Value Ref Range   Cholesterol 164 0 - 200 mg/dL   Triglycerides 152 (H) <150 mg/dL   HDL 32 (L) >40 mg/dL   Total CHOL/HDL Ratio 5.1 RATIO   VLDL 30 0 - 40 mg/dL   LDL Cholesterol 102 (H) 0 - 99 mg/dL  Hemoglobin and hematocrit, blood  Result Value Ref Range   Hemoglobin 9.9 (L) 12.0 - 15.0 g/dL   HCT 34.8 (L) 36.0 - 46.0 %  Hemoglobin A1c  Result Value Ref Range   Hgb A1c MFr Bld 6.8 (H) 4.8 - 5.6 %   Mean Plasma Glucose 148.46 mg/dL      Assessment & Plan:   Encounter Diagnoses  Name Primary?  . Diabetes mellitus without complication ([place]) Yes  . Essential hypertension   . Anemia, unspecified type   . Hyperlipidemia, unspecified hyperlipidemia type   . Morbid obesity ([place])      -reviewed labs with pt  -pt encouraged to Get back on iron- try qod to ease the constipation side effect -pt to Continue her other meds -She already had flu shot -will have follow up visit with pt in 1 month via [facility] to check bp (she has bp machine)

## 2019-08-24 ENCOUNTER — Ambulatory Visit: Payer: Medicaid Other | Admitting: Physician Assistant

## 2019-08-24 VITALS — BP 160/97 | HR 88

## 2019-08-24 DIAGNOSIS — D649 Anemia, unspecified: Secondary | ICD-10-CM

## 2019-08-24 DIAGNOSIS — I10 Essential (primary) hypertension: Secondary | ICD-10-CM

## 2019-08-24 DIAGNOSIS — E119 Type 2 diabetes mellitus without complications: Secondary | ICD-10-CM

## 2019-08-24 DIAGNOSIS — E669 Obesity, unspecified: Secondary | ICD-10-CM

## 2019-08-24 MED ORDER — AMLODIPINE BESYLATE 5 MG PO TABS: 5.0000 mg | ORAL_TABLET | Freq: Every day | ORAL | 1 refills | Status: DC

## 2019-08-24 NOTE — Progress Notes (Signed)
   BP (!) 160/97   Pulse 88    Subjective:    Patient ID: Amy Cardenas, female    DOB: 01-11-1971, 48 y.o.   MRN: [id number]  HPI: Amy R Pinkerton is a 48 y.o. female presenting on 08/24/2019 for No chief complaint on file.   HPI  This is a telemedicine appointment through [employer] due to coronavirus pandemic  I connected with  Amy R Tatro on 08/24/19 by a video enabled telemedicine application and verified that I am speaking with the correct person using two identifiers.   I discussed the limitations of evaluation and management by telemedicine. The patient expressed understanding and agreed to proceed.  Pt is at home.  Provider is at office  Patient is a 48 year old female with diabetes and hypertension.  Her appointment today is to follow-up on her hypertension.   Pt is continuing with school for respiratory therapy.  She expects to finish end of next summer.  She is tired but thinks that is because she needs to go back on her iron.   She has been monitoring her bp.  Last night 154/72    Relevant past medical, surgical, family and social history reviewed and updated as indicated. Interim medical history since our last visit reviewed. Allergies and medications reviewed and updated.  CURRENT MEDS: Glipizide 5mg  qd Losartan 100mg  qd Vit d   Review of Systems  Per HPI unless specifically indicated above     Objective:    BP (!) 160/97   Pulse 88   Wt Readings from Last 3 Encounters:  04/22/19 (!) 309 lb (140.2 kg)  11/19/18 (!) 308 lb 8 oz (139.9 kg)  08/11/18 299 lb (135.6 kg)    Physical Exam Constitutional:      General: She is not in acute distress.    Appearance: Normal appearance. She is obese. She is not ill-appearing.  HENT:     Head: Normocephalic and atraumatic.  Pulmonary:     Effort: Pulmonary effort is normal. No respiratory distress.  Neurological:     Mental Status: She is alert and oriented to person, place, and time.  Psychiatric:      Attention and Perception: Attention normal.        Speech: Speech normal.        Behavior: Behavior is cooperative.          Assessment & Plan:    Encounter Diagnoses  Name Primary?  . Diabetes mellitus without complication ([place]) Yes  . Essential hypertension   . Anemia, unspecified type   . Obesity, unspecified classification, unspecified obesity type, unspecified whether serious comorbidity present      Patient is to continue her losartan will add amlodipine.  Patient is encouraged to continue to monitor her blood pressure at home  Patient will follow up in 6 weeks with rechecking labs prior to that appointment time.  Patient is to contact office sooner as needed.  Patient is reminded to continue to please wear a mask when in public to reduce the risk of COVID-19 transmission.

## 2019-08-25 ENCOUNTER — Encounter: Payer: Self-pay | Admitting: Physician Assistant

## 2019-09-14 ENCOUNTER — Other Ambulatory Visit: Payer: No Typology Code available for payment source

## 2019-09-15 ENCOUNTER — Other Ambulatory Visit: Payer: Self-pay | Admitting: Physician Assistant

## 2019-10-11 ENCOUNTER — Other Ambulatory Visit: Payer: Self-pay

## 2019-10-11 ENCOUNTER — Other Ambulatory Visit (HOSPITAL_COMMUNITY)
Admission: RE | Admit: 2019-10-11 | Discharge: 2019-10-11 | Disposition: A | Discharge status: Home / Self Care | Payer: No Typology Code available for payment source | Source: Ambulatory Visit | Attending: Physician Assistant | Admitting: Physician Assistant

## 2019-10-11 DIAGNOSIS — I10 Essential (primary) hypertension: Secondary | ICD-10-CM

## 2019-10-11 DIAGNOSIS — E119 Type 2 diabetes mellitus without complications: Secondary | ICD-10-CM

## 2019-10-11 DIAGNOSIS — D649 Anemia, unspecified: Secondary | ICD-10-CM

## 2019-10-11 LAB — LIPID PANEL
Cholesterol: 177 mg/dL (ref 0–200)
HDL: 35 mg/dL — ABNORMAL LOW (ref >40)
LDL Cholesterol: 111 mg/dL — ABNORMAL HIGH (ref 0–99)
Total CHOL/HDL Ratio: 5.1 RATIO
Triglycerides: 155 mg/dL — ABNORMAL HIGH (ref <150)
VLDL: 31 mg/dL (ref 0–40)

## 2019-10-11 LAB — HEMOGLOBIN A1C
Hgb A1c MFr Bld: 7.1 % — ABNORMAL HIGH (ref 4.8–5.6)
Mean Plasma Glucose: 157.07 mg/dL

## 2019-10-11 LAB — COMPREHENSIVE METABOLIC PANEL
ALT: 15 U/L (ref 0–44)
AST: 12 U/L — ABNORMAL LOW (ref 15–41)
Albumin: 3.7 g/dL (ref 3.5–5.0)
Alkaline Phosphatase: 58 U/L (ref 38–126)
Anion gap: 9 (ref 5–15)
BUN: 14 mg/dL (ref 6–20)
CO2: 28 mmol/L (ref 22–32)
Calcium: 9.5 mg/dL (ref 8.9–10.3)
Chloride: 102 mmol/L (ref 98–111)
Creatinine, Ser: 0.81 mg/dL (ref 0.44–1.00)
GFR calc Af Amer: >60 mL/min (ref >60)
GFR calc non Af Amer: >60 mL/min (ref >60)
Glucose, Bld: 149 mg/dL — ABNORMAL HIGH (ref 70–99)
Potassium: 4.2 mmol/L (ref 3.5–5.1)
Sodium: 139 mmol/L (ref 135–145)
Total Bilirubin: 0.5 mg/dL (ref 0.3–1.2)
Total Protein: 7.5 g/dL (ref 6.5–8.1)

## 2019-10-11 LAB — CBC
HCT: 37.2 % (ref 36.0–46.0)
Hemoglobin: 10.8 g/dL — ABNORMAL LOW (ref 12.0–15.0)
MCH: 22 pg — ABNORMAL LOW (ref 26.0–34.0)
MCHC: 29 g/dL — ABNORMAL LOW (ref 30.0–36.0)
MCV: 75.6 fL — ABNORMAL LOW (ref 80.0–100.0)
Platelets: 355 10*3/uL (ref 150–400)
RBC: 4.92 MIL/uL (ref 3.87–5.11)
RDW: 17.2 % — ABNORMAL HIGH (ref 11.5–15.5)
WBC: 7.7 10*3/uL (ref 4.0–10.5)
nRBC: 0 % (ref 0.0–0.2)

## 2019-10-12 LAB — MICROALBUMIN, URINE: Microalb, Ur: 7 ug/mL — ABNORMAL HIGH

## 2019-10-13 ENCOUNTER — Encounter: Payer: Self-pay | Admitting: Physician Assistant

## 2019-10-13 ENCOUNTER — Ambulatory Visit: Payer: Medicaid Other | Admitting: Physician Assistant

## 2019-10-13 DIAGNOSIS — D649 Anemia, unspecified: Secondary | ICD-10-CM

## 2019-10-13 DIAGNOSIS — E785 Hyperlipidemia, unspecified: Secondary | ICD-10-CM

## 2019-10-13 DIAGNOSIS — I10 Essential (primary) hypertension: Secondary | ICD-10-CM

## 2019-10-13 DIAGNOSIS — E119 Type 2 diabetes mellitus without complications: Secondary | ICD-10-CM

## 2019-10-13 DIAGNOSIS — E669 Obesity, unspecified: Secondary | ICD-10-CM

## 2019-10-13 MED ORDER — LOSARTAN POTASSIUM 100 MG PO TABS: 100.0000 mg | ORAL_TABLET | Freq: Every day | ORAL | 4 refills | Status: DC

## 2019-10-13 MED ORDER — AMLODIPINE BESYLATE 5 MG PO TABS: 5.0000 mg | ORAL_TABLET | Freq: Every day | ORAL | 4 refills | Status: DC

## 2019-10-13 MED ORDER — GLIPIZIDE 5 MG PO TABS: 5.0000 mg | ORAL_TABLET | Freq: Every day | ORAL | 4 refills | Status: DC

## 2019-10-13 NOTE — Progress Notes (Signed)
There were no vitals taken for this visit.   Subjective:    Patient ID: Amy Cardenas, female    DOB: 1971/02/26, 49 y.o.   MRN: [id number]  HPI: Amy Cardenas is a 49 y.o. female presenting on 10/13/2019 for No chief complaint on file.   HPI   This is a telemedicine appointment through [employer] due to coronavirus pandemic.    I connected with  Amy Cardenas on 10/13/19 by a video enabled telemedicine application and verified that I am speaking with the correct person using two identifiers.   I discussed the limitations of evaluation and management by telemedicine. The patient expressed understanding and agreed to proceed.  Pt is somewhere in her parked car.  Provider is working from home office.    Pt is 18yoF with HTN and DM.  She does not have her BP machine with her.  Discussed with pt that she really needs to have that to make appointment useful.   She says she checks it at home occasionally and it has been okay- mostly in the 130s.   She is still in school.  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    She says she is doing well and doesn't have any complaints today.      Relevant past medical, surgical, family and social history reviewed and updated as indicated. Interim medical history since our last visit reviewed. Allergies and medications reviewed and updated.   Current Outpatient Medications:  .  amLODipine (NORVASC) 5 MG tablet, Take 1 tablet (5 mg total) by mouth daily., Disp: 30 tablet, Rfl: 1 .  Ascorbic Acid (VITA-C PO), Take 2 tablets by mouth daily., Disp: , Rfl:  .  Cholecalciferol (VITAMIN D3 PO), Take 50 mcg by mouth daily., Disp: , Rfl:  .  Ferrous Sulfate (IRON PO), Take by mouth., Disp: , Rfl:  .  glipiZIDE (GLUCOTROL) 5 MG tablet, Take 1 tablet (5 mg total) by mouth daily before breakfast., Disp: 30 tablet, Rfl: 4 .  losartan (COZAAR) 100 MG tablet, Take 1 tablet by mouth once daily, Disp: 30 tablet,  Rfl: 0 .  albuterol (PROVENTIL HFA;VENTOLIN HFA) 108 (90 Base) MCG/ACT inhaler, Inhale 1 puff into the lungs every 6 (six) hours as needed for wheezing or shortness of breath., Disp: , Rfl:      Review of Systems  Per HPI unless specifically indicated above     Objective:    There were no vitals taken for this visit.  Wt Readings from Last 3 Encounters:  04/22/19 (!) 309 lb (140.2 kg)  11/19/18 (!) 308 lb 8 oz (139.9 kg)  08/11/18 299 lb (135.6 kg)    Physical Exam Constitutional:      General: She is not in acute distress.    Appearance: Normal appearance. She is obese. She is not ill-appearing.  HENT:     Head: Normocephalic and atraumatic.  Pulmonary:     Effort: Pulmonary effort is normal. No respiratory distress.  Neurological:     Mental Status: She is alert and oriented to person, place, and time.  Psychiatric:        Attention and Perception: Attention normal.        Mood and Affect: Mood normal.        Speech: Speech normal.        Behavior: Behavior is cooperative.     Results for orders placed or performed during the hospital encounter of  10/11/19  Hemoglobin A1c  Result Value Ref Range   Hgb A1c MFr Bld 7.1 (H) 4.8 - 5.6 %   Mean Plasma Glucose 157.07 mg/dL  CBC  Result Value Ref Range   WBC 7.7 4.0 - 10.5 K/uL   RBC 4.92 3.87 - 5.11 MIL/uL   Hemoglobin 10.8 (L) 12.0 - 15.0 g/dL   HCT 37.2 36.0 - 46.0 %   MCV 75.6 (L) 80.0 - 100.0 fL   MCH 22.0 (L) 26.0 - 34.0 pg   MCHC 29.0 (L) 30.0 - 36.0 g/dL   RDW 17.2 (H) 11.5 - 15.5 %   Platelets 355 150 - 400 K/uL   nRBC 0.0 0.0 - 0.2 %  Comprehensive metabolic panel  Result Value Ref Range   Sodium 139 135 - 145 mmol/L   Potassium 4.2 3.5 - 5.1 mmol/L   Chloride 102 98 - 111 mmol/L   CO2 28 22 - 32 mmol/L   Glucose, Bld 149 (H) 70 - 99 mg/dL   BUN 14 6 - 20 mg/dL   Creatinine, Ser 0.81 0.44 - 1.00 mg/dL   Calcium 9.5 8.9 - 10.3 mg/dL   Total Protein 7.5 6.5 - 8.1 g/dL   Albumin 3.7 3.5 - 5.0 g/dL    AST 12 (L) 15 - 41 U/L   ALT 15 0 - 44 U/L   Alkaline Phosphatase 58 38 - 126 U/L   Total Bilirubin 0.5 0.3 - 1.2 mg/dL   GFR calc non Af Amer >60 >60 mL/min   GFR calc Af Amer >60 >60 mL/min   Anion gap 9 5 - 15  Lipid panel  Result Value Ref Range   Cholesterol 177 0 - 200 mg/dL   Triglycerides 155 (H) <150 mg/dL   HDL 35 (L) >40 mg/dL   Total CHOL/HDL Ratio 5.1 RATIO   VLDL 31 0 - 40 mg/dL   LDL Cholesterol 111 (H) 0 - 99 mg/dL  Microalbumin, urine  Result Value Ref Range   Microalb, Ur 7.0 (H) Not Estab. ug/mL      Assessment & Plan:    Encounter Diagnoses  Name Primary?  . Diabetes mellitus without complication ([place]) Yes  . Essential hypertension   . Anemia, unspecified type   . Hyperlipidemia, unspecified hyperlipidemia type   . Obesity, unspecified classification, unspecified obesity type, unspecified whether serious comorbidity present      -reviewed labs with pt  -pt to continue current medications.  Will discuss addition of statin at next appointment if still elevated.  She has declined this in the past.  -Discussed with pt PAP good for 5 years (she had normal cytology and HPV)- discussed testing updated because she kept repeatedly saying that she had never heard of it being good for 5 years. -pt to follow up  3 months.  She is to contact office sooner prn

## 2019-12-22 IMAGING — MG DIGITAL DIAGNOSTIC BILATERAL MAMMOGRAM WITH TOMO AND CAD
8 of 17 series · 8 of 40 positions shown · non-contrast
Comparison: Previous exam(s).

CLINICAL DATA: Newly palpable right breast mass.

EXAM:
DIGITAL DIAGNOSTIC BILATERAL MAMMOGRAM WITH CAD AND TOMO
ULTRASOUND RIGHT BREAST

[L MLO synth-2D (1 of 3)]
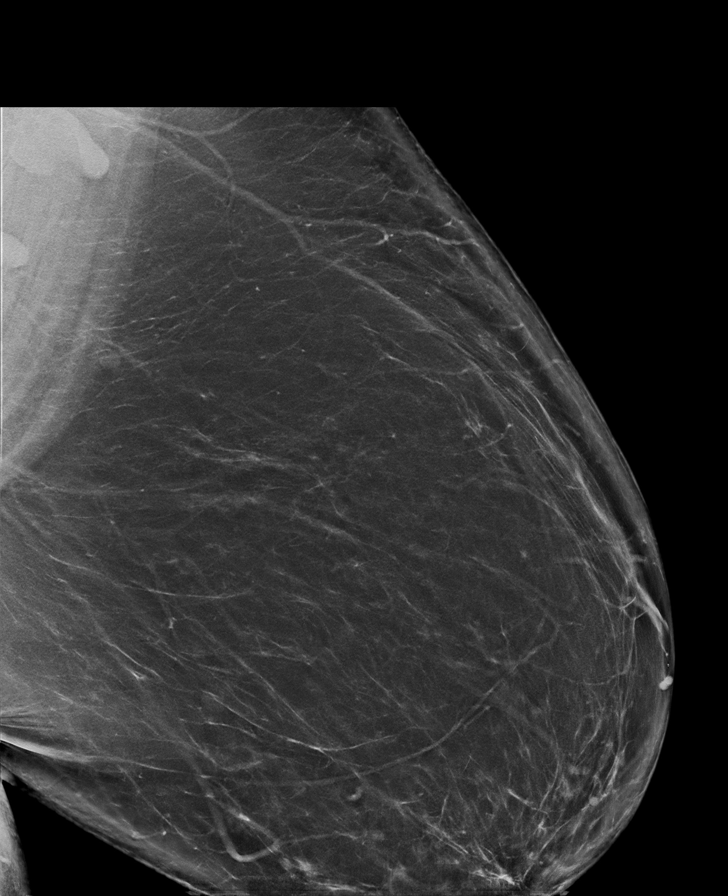

[R CC synth-2D]
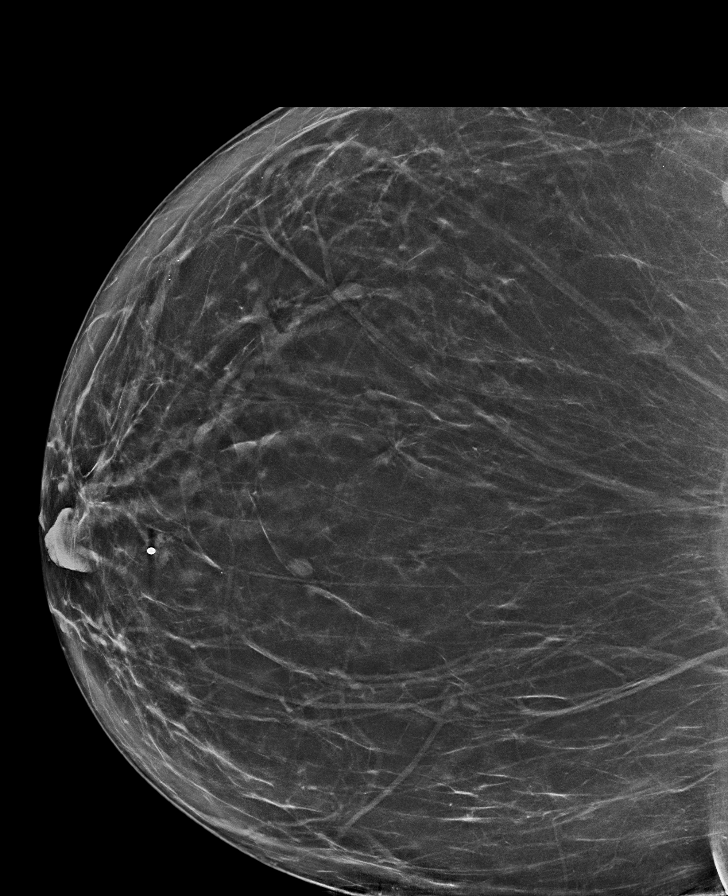

[L MLO synth-2D (2 of 3)]
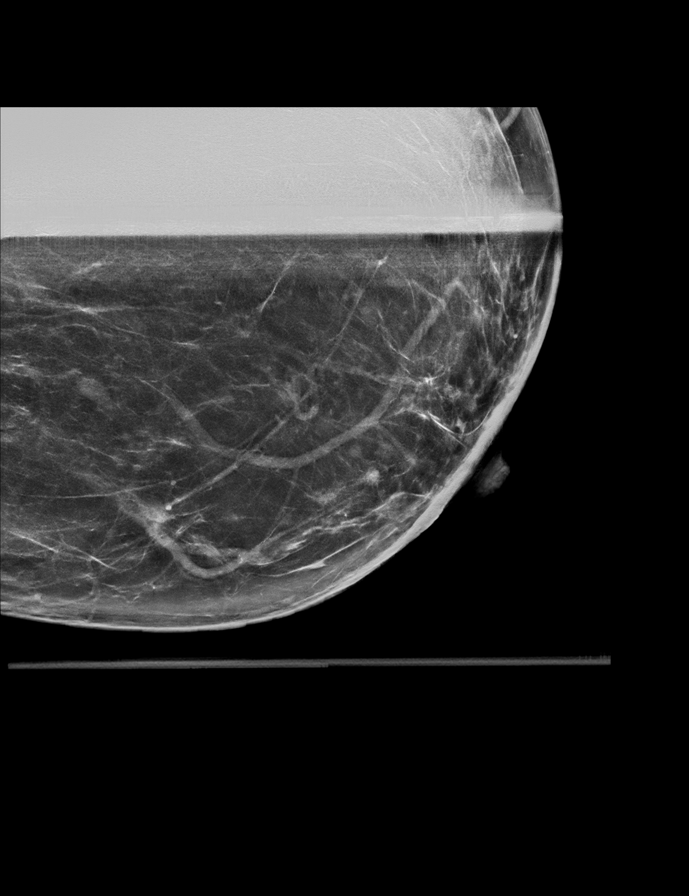

[R MLO synth-2D (1 of 2)]
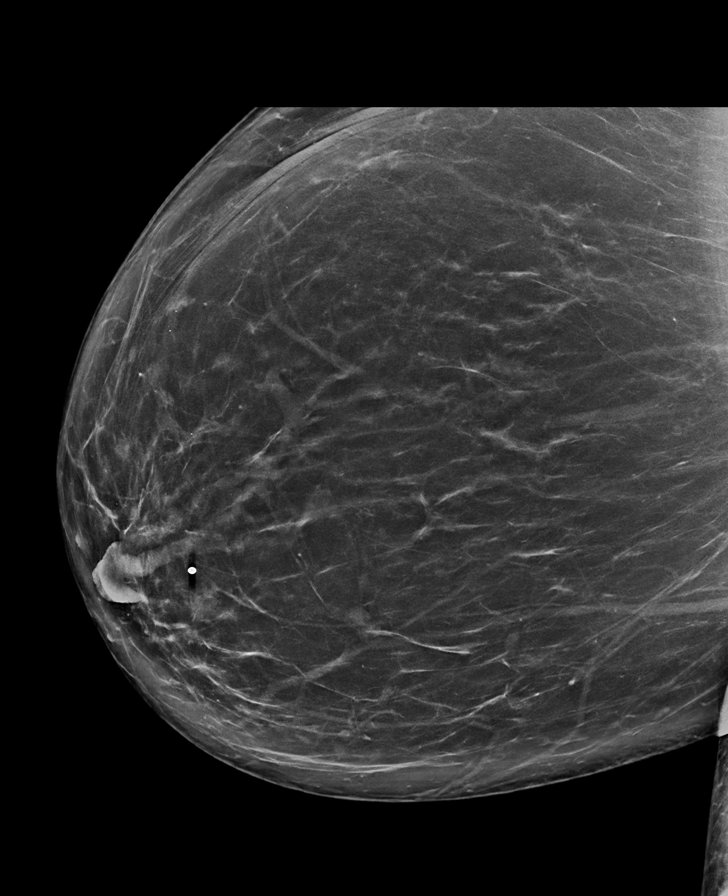

[R MLO synth-2D (2 of 2)]
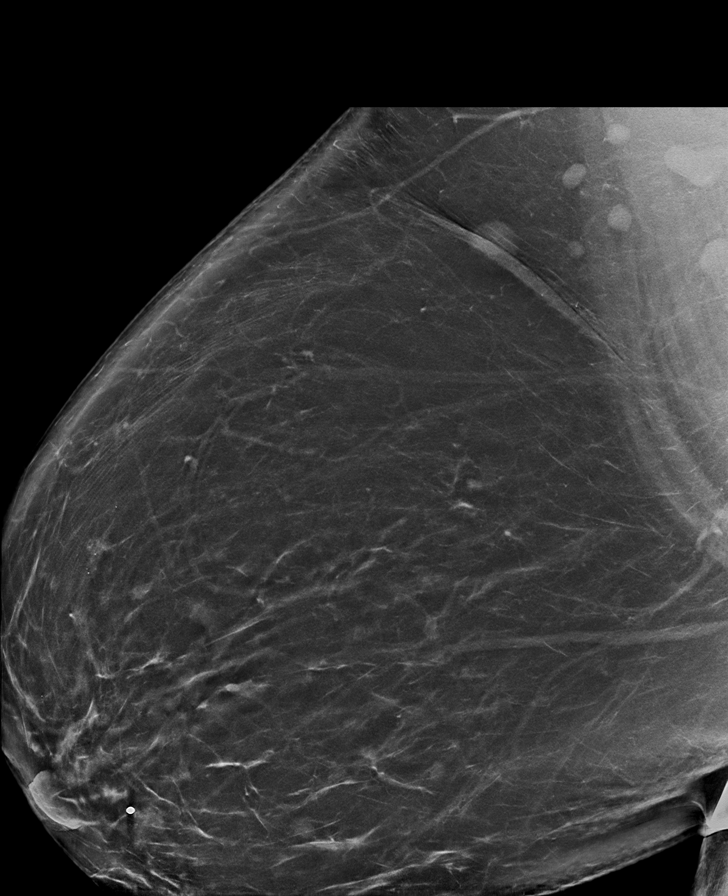

[L MLO synth-2D (3 of 3)]
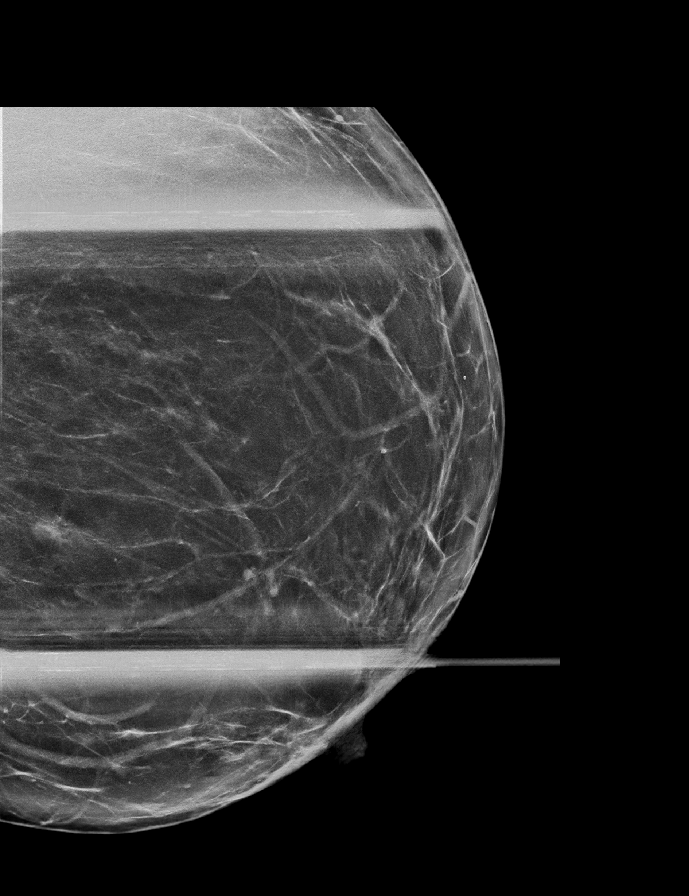

[L CV synth-2D]
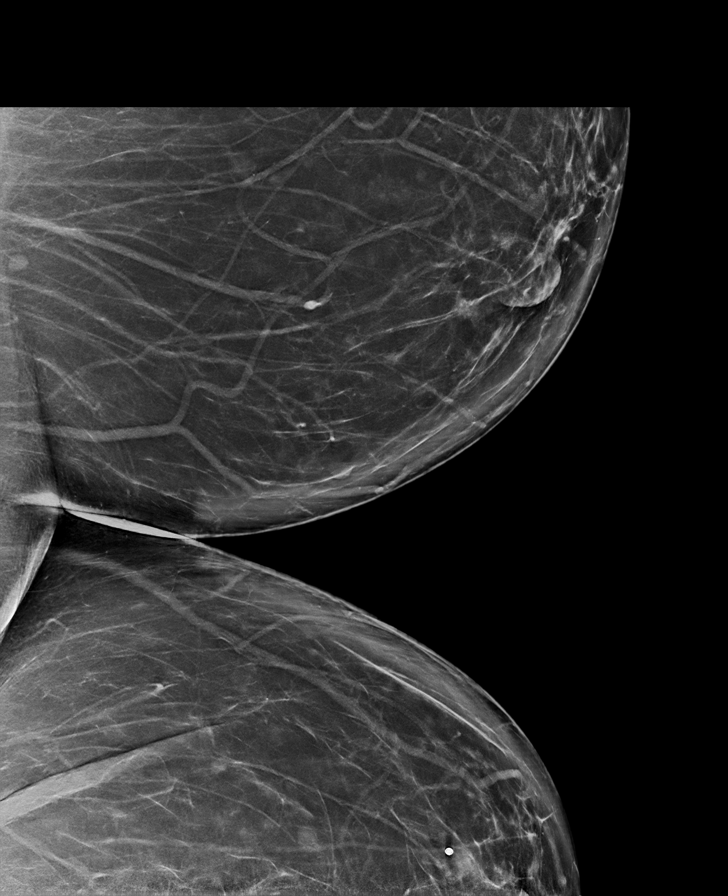

[R MLO tomo · tomo slice 51/100.0]
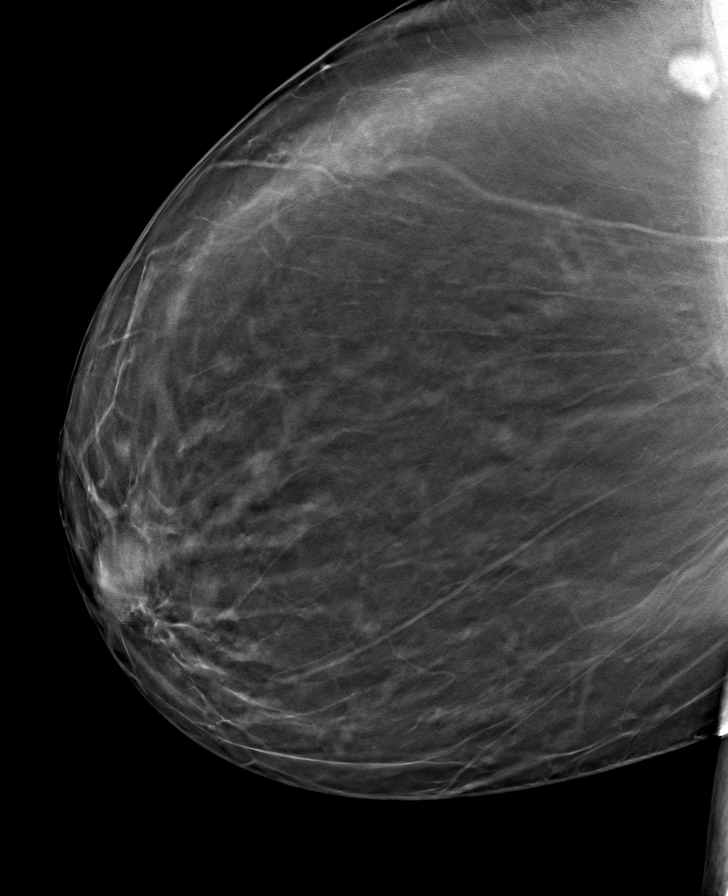

[8 of 40 positions shown; findings below may reference images not displayed]

ACR Breast Density Category b: There are scattered areas of
fibroglandular density.
FINDINGS: There is a multilobulated mass in the region of the patient's
palpable lump. No other mammographic changes.

Mammographic images were processed with CAD.

On physical exam, no suspicious lumps are identified.

Targeted ultrasound is performed, showing a focal mass in the right
breast 4 o'clock in the region of the patient's lump. While there
are cystic components, there was significant internal blood flow as
well. Two borderline nodes are seen in the right axilla.
IMPRESSION: Indeterminate vascular newly palpable right breast mass. Borderline
lymph nodes in the right axilla.

RECOMMENDATION:
Recommend ultrasound-guided biopsy of the right breast mass. I
suspect the borderline lymph nodes are normal as there is no
mammographic change. If the biopsy of the right breast mass
demonstrates malignancy, recommend biopsy of 1 of the lymph nodes.
If the biopsy of the right breast mass is benign, recommend a
six-month follow-up of the right breast nodes.

I have discussed the findings and recommendations with the patient.
Results were also provided in writing at the conclusion of the
visit. If applicable, a reminder letter will be sent to the patient
regarding the next appointment.

BI-RADS CATEGORY  4: Suspicious.

## 2020-01-11 ENCOUNTER — Ambulatory Visit: Payer: Medicaid Other | Admitting: Physician Assistant

## 2020-01-18 ENCOUNTER — Ambulatory Visit: Payer: Medicaid Other | Admitting: Physician Assistant

## 2020-01-24 ENCOUNTER — Other Ambulatory Visit (HOSPITAL_COMMUNITY)
Admission: RE | Admit: 2020-01-24 | Discharge: 2020-01-24 | Disposition: A | Discharge status: Home / Self Care | Payer: No Typology Code available for payment source | Source: Ambulatory Visit | Attending: Physician Assistant | Admitting: Physician Assistant

## 2020-01-24 DIAGNOSIS — E119 Type 2 diabetes mellitus without complications: Secondary | ICD-10-CM | POA: Insufficient documentation

## 2020-01-24 DIAGNOSIS — E785 Hyperlipidemia, unspecified: Secondary | ICD-10-CM

## 2020-01-24 DIAGNOSIS — I10 Essential (primary) hypertension: Secondary | ICD-10-CM | POA: Insufficient documentation

## 2020-01-24 DIAGNOSIS — D649 Anemia, unspecified: Secondary | ICD-10-CM | POA: Insufficient documentation

## 2020-01-24 LAB — COMPREHENSIVE METABOLIC PANEL
ALT: 18 U/L (ref 0–44)
AST: 17 U/L (ref 15–41)
Albumin: 3.7 g/dL (ref 3.5–5.0)
Alkaline Phosphatase: 56 U/L (ref 38–126)
Anion gap: 8 (ref 5–15)
BUN: 16 mg/dL (ref 6–20)
CO2: 31 mmol/L (ref 22–32)
Calcium: 9.2 mg/dL (ref 8.9–10.3)
Chloride: 99 mmol/L (ref 98–111)
Creatinine, Ser: 0.87 mg/dL (ref 0.44–1.00)
GFR calc Af Amer: >60 mL/min (ref >60)
GFR calc non Af Amer: >60 mL/min (ref >60)
Glucose, Bld: 167 mg/dL — ABNORMAL HIGH (ref 70–99)
Potassium: 4.2 mmol/L (ref 3.5–5.1)
Sodium: 138 mmol/L (ref 135–145)
Total Bilirubin: 0.8 mg/dL (ref 0.3–1.2)
Total Protein: 7.6 g/dL (ref 6.5–8.1)

## 2020-01-24 LAB — LIPID PANEL
Cholesterol: 170 mg/dL (ref 0–200)
HDL: 36 mg/dL — ABNORMAL LOW (ref >40)
LDL Cholesterol: 104 mg/dL — ABNORMAL HIGH (ref 0–99)
Total CHOL/HDL Ratio: 4.7 RATIO
Triglycerides: 151 mg/dL — ABNORMAL HIGH (ref <150)
VLDL: 30 mg/dL (ref 0–40)

## 2020-01-24 LAB — HEMOGLOBIN AND HEMATOCRIT, BLOOD
HCT: 37.2 % (ref 36.0–46.0)
Hemoglobin: 10.8 g/dL — ABNORMAL LOW (ref 12.0–15.0)

## 2020-01-24 LAB — HEMOGLOBIN A1C
Hgb A1c MFr Bld: 7.7 % — ABNORMAL HIGH (ref 4.8–5.6)
Mean Plasma Glucose: 174.29 mg/dL

## 2020-01-26 ENCOUNTER — Encounter: Payer: Self-pay | Admitting: Physician Assistant

## 2020-01-26 ENCOUNTER — Ambulatory Visit: Payer: Medicaid Other | Admitting: Physician Assistant

## 2020-01-26 ENCOUNTER — Other Ambulatory Visit: Payer: Self-pay

## 2020-01-26 VITALS — BP 130/66 | HR 94 | Temp 98.1°F | Wt 321.0 lb

## 2020-01-26 DIAGNOSIS — D649 Anemia, unspecified: Secondary | ICD-10-CM

## 2020-01-26 DIAGNOSIS — E669 Obesity, unspecified: Secondary | ICD-10-CM

## 2020-01-26 DIAGNOSIS — E119 Type 2 diabetes mellitus without complications: Secondary | ICD-10-CM

## 2020-01-26 DIAGNOSIS — I10 Essential (primary) hypertension: Secondary | ICD-10-CM

## 2020-01-26 NOTE — Progress Notes (Signed)
BP 130/66   Pulse 94   Temp 98.1 F (36.7 C)   Wt (!) 321 lb (145.6 kg)   SpO2 95%   BMI 60.65 kg/m    Subjective:    Patient ID: Amy Cardenas, female    DOB: 08/19/1971, 49 y.o.   MRN: [id number]  HPI: Amy Cardenas is a 49 y.o. female presenting on 01/26/2020 for Diabetes, Hypertension, and Hyperlipidemia   HPI  Pt had a negative covid 19 screening questionnaire.    Pt is 71yoF with HTN and DM.  She monitors her bp at home.  bp running 127-130 at home  She finished plebotomy school yetsteday.  She is going to wait before trying respiratory school.  She is going to try to get a job at [employer] soon.    She has been eating a lot of fruit.  Also was drinking body armour.   She also complains of Bleeding a lot with menses. -  1st 2 days her napin changed 4 times/day.  After the first 2 days, bleeding is normal.     Relevant past medical, surgical, family and social history reviewed and updated as indicated. Interim medical history since our last visit reviewed. Allergies and medications reviewed and updated.   Current Outpatient Medications:  .  albuterol (PROVENTIL HFA;VENTOLIN HFA) 108 (90 Base) MCG/ACT inhaler, Inhale 1 puff into the lungs every 6 (six) hours as needed for wheezing or shortness of breath., Disp: , Rfl:  .  amLODipine (NORVASC) 5 MG tablet, Take 1 tablet (5 mg total) by mouth daily., Disp: 30 tablet, Rfl: 4 .  Ascorbic Acid (VITA-C PO), Take 2 tablets by mouth daily., Disp: , Rfl:  .  Cholecalciferol (VITAMIN D3 PO), Take 50 mcg by mouth daily., Disp: , Rfl:  .  Ferrous Sulfate (IRON PO), Take by mouth., Disp: , Rfl:  .  glipiZIDE (GLUCOTROL) 5 MG tablet, Take 1 tablet (5 mg total) by mouth daily before breakfast., Disp: 30 tablet, Rfl: 4 .  losartan (COZAAR) 100 MG tablet, Take 1 tablet (100 mg total) by mouth daily., Disp: 30 tablet, Rfl: 4    Review of Systems  Per HPI unless specifically indicated above     Objective:    BP 130/66    Pulse 94   Temp 98.1 F (36.7 C)   Wt (!) 321 lb (145.6 kg)   SpO2 95%   BMI 60.65 kg/m   Wt Readings from Last 3 Encounters:  01/26/20 (!) 321 lb (145.6 kg)  04/22/19 (!) 309 lb (140.2 kg)  11/19/18 (!) 308 lb 8 oz (139.9 kg)    Physical Exam Vitals reviewed.  Constitutional:      General: She is not in acute distress.    Appearance: She is well-developed. She is obese. She is not ill-appearing.  HENT:     Head: Normocephalic and atraumatic.  Cardiovascular:     Rate and Rhythm: Normal rate and regular rhythm.  Pulmonary:     Effort: Pulmonary effort is normal.     Breath sounds: Normal breath sounds.  Abdominal:     General: Bowel sounds are normal.     Palpations: Abdomen is soft. There is no mass.     Tenderness: There is no abdominal tenderness.  Musculoskeletal:     Cervical back: Neck supple.     Right lower leg: No edema.     Left lower leg: No edema.  Lymphadenopathy:     Cervical: No cervical adenopathy.  Skin:  General: Skin is warm and dry.  Neurological:     Mental Status: She is alert and oriented to person, place, and time.  Psychiatric:        Attention and Perception: Attention normal.        Speech: Speech normal.        Behavior: Behavior normal. Behavior is cooperative.     Results for orders placed or performed during the hospital encounter of 01/24/20  Hemoglobin and hematocrit, blood  Result Value Ref Range   Hemoglobin 10.8 (L) 12.0 - 15.0 g/dL   HCT 37.2 36.0 - 46.0 %  Hemoglobin A1c  Result Value Ref Range   Hgb A1c MFr Bld 7.7 (H) 4.8 - 5.6 %   Mean Plasma Glucose 174.29 mg/dL  Lipid panel  Result Value Ref Range   Cholesterol 170 0 - 200 mg/dL   Triglycerides 151 (H) <150 mg/dL   HDL 36 (L) >40 mg/dL   Total CHOL/HDL Ratio 4.7 RATIO   VLDL 30 0 - 40 mg/dL   LDL Cholesterol 104 (H) 0 - 99 mg/dL  Comprehensive metabolic panel  Result Value Ref Range   Sodium 138 135 - 145 mmol/L   Potassium 4.2 3.5 - 5.1 mmol/L   Chloride  99 98 - 111 mmol/L   CO2 31 22 - 32 mmol/L   Glucose, Bld 167 (H) 70 - 99 mg/dL   BUN 16 6 - 20 mg/dL   Creatinine, Ser 0.87 0.44 - 1.00 mg/dL   Calcium 9.2 8.9 - 10.3 mg/dL   Total Protein 7.6 6.5 - 8.1 g/dL   Albumin 3.7 3.5 - 5.0 g/dL   AST 17 15 - 41 U/L   ALT 18 0 - 44 U/L   Alkaline Phosphatase 56 38 - 126 U/L   Total Bilirubin 0.8 0.3 - 1.2 mg/dL   GFR calc non Af Amer >60 >60 mL/min   GFR calc Af Amer >60 >60 mL/min   Anion gap 8 5 - 15      Assessment & Plan:    Encounter Diagnoses  Name Primary?  . Diabetes mellitus without complication ([place]) Yes  . Essential hypertension   . Anemia, unspecified type   . Obesity, unspecified classification, unspecified obesity type, unspecified whether serious comorbidity present      -reviewed labs with pt  -pt to continue current medications.  Counseled pt to avoid body armour consumption due to sugar content.  No changes to DM meds -Possible megace if bleeding worsens -pt to follow up in 3 months.  She is to contact office sooner prn

## 2020-01-27 ENCOUNTER — Telehealth: Payer: Self-pay

## 2020-01-27 NOTE — Telephone Encounter (Signed)
Pt was contacted for enrollment into the Remote Monitoring in the [facility] Hypertension Program per a referral sent by [facility] to assist with BP management and health coaching  Pt was available to talk about program and enrollment requirements.  Pt agreed to move forth with the enrollment process.  Enrollment appt was made for Monday, Jan 31, 2020 at [facility] [address], [place]  Program enrollment information and equirements were sent by email to patient to review prior to appointment date per their  requests.     Phone call ended

## 2020-02-01 NOTE — Congregational Nurse Program (Signed)
Patient presents to [facility] on today to initiate enrollment in the Remote Monitoring in the [facility] Program and states her readiness to participate.   Pt successfully onboarded with initial BP and scale test readings that connected  to the [facility] and provider dashboard successfully.   Initial BP Test reading was 127/84 and weight was 328lb  Pt was provided with Remote Monitoring in the [facility] patient facing booklet that reviewed all program requirements, guidelines, dash diet brochure/guidelines, proper techinque for monitoring and troubleshooting equipment.   -Reviewed and verified patient's BP Medication and consistency of use with patient. - Pt understood guidelines and expectations of program,  -Pt understood and demonstrated proper use of BP and Scale equipment by way of teachbacks after it was demonstrated to her - Pt was provided BP Basics Manual with a coordinating BP-Personal Action Plan that was completed on site and placed in chart --Pt identified and created her personal action plan goals successully           Plan:        -Pt will check BP twice daily between 7am-8am & at 9pm -Pt will monitor her weight  once (1) daily during 7am without clothing   -Pt will engage in physical activity to 30 min 3 times/week by way of using brisk walks during time of 5:00pm   -Pt will increase diet to eat more dark green and leafy vegetable, choose more healthy proteins such as fish/chicken with the practice of cooking by way of less frying and using more baked, broiled, grilled or boiled methods.    -Pt will plan to eat less sweets such cookies  -Pt will promote eating before 7pm -Pt will continue to f/u with provider as recommended and continue medications as prescribed at 7pm -1st F/U will be made on (Tues) 5/11//21 to check in regarding 1st day use of the equipment and to address any questions -2nd F/U will be made on (Mon) 02/07/20 regarding BP  result average and and trends

## 2020-02-01 NOTE — Congregational Nurse Program (Signed)
  Dept: [phone]   Congregational Nurse Program Note  Date of Encounter: 01/31/2020  Past Medical History: Past Medical History:  Diagnosis Date  . Abnormal mammogram of right breast 08/04/2018  . Anemia   . Anxiety   . Diabetes mellitus without complication ([place]) [id number]  . Hypertension   . Obesity   . Umbilical hernia     Encounter Details: CNP Questionnaire - 02/01/20 1517      Questionnaire   Race  Black or African American    Location Patient Served At  [employer]  Not Applicable    Uninsured  Uninsured (NEW 1x/quarter)    Food  No food insecurities    Housing/Utilities  Yes, have permanent housing    Transportation  No transportation needs    Medication  No medication insecurities    Medical Provider  Yes    Referrals  [employer] Renewal Enrollment   ED Visit Averted  Not Applicable    Life-Saving Intervention Made  Not Applicable

## 2020-02-02 ENCOUNTER — Telehealth: Payer: Self-pay

## 2020-02-02 NOTE — Telephone Encounter (Signed)
Pt was f/u by phone on 02/01/20 but unavailable and then f/u again 02/02/20 to address any questions or concerns she may have had since first day of using the BP/Scale equipment and collecting her BP and Scale measurements.     Pt states she feel good about using the equipment.   Pt states that she manually put in her weight, because she didn't think the numbers transmitted to our clinical portal due to lack of clarity if numbers had registered in the portal or not  Pt was advised there was a signficant drop in weight compared to the hypertension monitoring program scale weight which measured 328  Pt was reminded to make sure that her weight is accurately being recorded to the  automatic versus manual setting.   Also make sure that the bluetooth and wi-fi settings are turned on as well as the making sure the app is open before starting to take measurements.   Plan  -Pt was requested to re-measure weight on tonight around time of PM BP check with making sure all guidelines and positioning are in place as well as all connections on phone are working  Pt states she understood and will contact me in back in the morning.[name] if the readings are not showing up accurately   Phone call ended

## 2020-02-02 NOTE — Telephone Encounter (Signed)
F/u Phone call was deemed successful

## 2020-02-04 NOTE — Congregational Nurse Program (Signed)
  Dept: [phone]   Congregational Nurse Program Note  Date of Encounter: 01/31/2020  Past Medical History: Past Medical History:  Diagnosis Date  . Abnormal mammogram of right breast 08/04/2018  . Anemia   . Anxiety   . Diabetes mellitus without complication ([place]) [id number]  . Hypertension   . Obesity   . Umbilical hernia     Encounter Details:   Client was in today to enroll in Remote Patient Monitoring for hypertension program as a referral from her provider at [facility] [place].    Client was onboarded by Lissa Hoard LPN.  I have reviewed all notes and data from this enrollment including goals for blood pressure, medications as well as client's personal goals and action plan and agree with goals and plan as per program guidelines for remote patient monitoring.  Plan: RN will review daily blood pressure and weight readings that are uploaded remotely to the [place].  RN will monitor for any critical readings and report those to the provider per program guidelines, as well as review any needed interventions with W. Crowder LPN for follow up and recommendations with client.  RN will monitor and average blood pressure readings every 7 days and report as determined with provider and a progress note will be added for these averages.  Will review for any trending data from the remote readings for escalations and review client progress of goals with Lissa Hoard LPN weekly and as needed. Team will review and determine any changes any set goals and assess progress.   Client will have planned follow ups by phone with W. Crowder LPN and will receive health coaching and support.   Client has contact information of staff for any questions and concerns.

## 2020-02-07 ENCOUNTER — Telehealth: Payer: Self-pay

## 2020-02-07 NOTE — Telephone Encounter (Signed)
Pt was f/u by phone for weekly update on today to address any challenges or successes of BP and Weight checks and Personal Action Plan.   States she continues to feel confident about the use of the equipment.  States she did forget to take her BP meds on this morning, but her BP still met her goal.  States she has been implementing her Personal Action Goals as stated during enrollment.  -Taking meds on time at 7am-  On target (missed 1 dose this morning, but was reminded to take as soon as possible, states her BP was still within target though) -Monitor BP -7a and  9pm...  On target (sometimes have adjusted the evening time to be earlier between 8pm and 9pm) -Follow Healthy Eating Plan     More dark green leafy vegetables (Purchased more Salads at [facility] for lunch and dinner     Less fried food, more baked, broiled, grilled, boiled(fish)  - On target               (Reduced amoun of [employer] , only ate [employer] 1 time on last week) and has been             cooking more grilled food     No cookies before bed-  On target  (None on Last Week)    Eat Dinner before 7pm - On target  (Ate late 1 day  this past Sun at 9pm)     Added more water/lemon water to her diet -Monitor Weight by weighing self and tracking with [employer] Scale-  On target  A weight loss has occurred  -Increase Physical Activity (30 min/3 times a week- 5pm each time)  On [employer]  States she completed a 1 mile walk with her children as a part of her 3 times a week walking plan   Barriers Pt states on the weekend she is only able to perform BP readings in the evening at her parents home, bc her home does not have internet.  She stays at parents on the weekdays and goes home on weekends     [place] the same plan with exception of revising the "less fried food" to eating  "NO FRIED  FOODS" at all this week

## 2020-02-10 ENCOUNTER — Telehealth: Payer: Self-pay | Admitting: Student

## 2020-02-10 ENCOUNTER — Telehealth: Payer: Self-pay

## 2020-02-10 NOTE — Telephone Encounter (Signed)
Notified today by Lissa Hoard LPN/[facility] that in following up with client today for her remote patient monitoring for hypertension, client reported she stopped her blood pressure medications Losartan and Amlodipine on 02/08/20 due to "worrisome side effects" of ankle swelling and increased tiredness. Client has been on target for blood pressure with last 7 days remote blood pressure average 122/81.   RN called and relayed above information to [facility] and reasons client stated for stopping medications. [facility] LPN will notify Albertina Parr PA for further instruction and per LPN will contact Ms Logan.   I called client to follow up. Client was concerned with her legs and ankles swelling and feeling tired and noted that those can be side effects of those medications. Discussed with client and strongly urged client to always notify her provider first with any concerns regarding her medications or any side effect BEFORE stopping any medication on her own. Discussed risks of stopping some medications abruptly. Discussed that RN notified [facility] regarding her medications and she should be called by Illene Bolus at [facility] regarding what should be next steps. Discussed with client that only her provider can recommend her to stop or change any of her medications. Client reports understanding. Client reports that since stopping medications her swelling is better and she "feels much better" Congratulated client on lifestyle changes with exercise and diet. Will plan follow up with client tomorrow regarding clinics recommendations. Encouraged client to call [facility] and talk to LPN Illene Bolus today if she had not been called by 3pm today. Client will also continue to monitor remotely her blood pressure and weights. Client is agreeable and states understanding.   Debria Garret RN [facility]

## 2020-02-10 NOTE — Telephone Encounter (Signed)
Pt called 02-10-20 due to being advised by Elfredia Nevins, RN for self d/c her amlodipine and losartan 2 days ago (02-08-20). Pt is in the remote monitoring program for HTN. Pt states she d/c her medications due to bilateral leg swelling (pt reports L leg worse than R leg) which started about 6 months ago. Pt believed her swelling was caused by drinking sodas, but pt reports d/c sodas about 4 months ago with no improvements in her legs. Pt states she read information regarding how HTN medications can cause swelling so pt now believes her blood pressure meds are the reason for her swelling. Pt states she has had improvements in her legs with no more swelling since d/c her amlodipine and losartan. Pt also reports her BP being good with systolic BP being in the [id number]  Pt has been on losartan since 07/2018 and amlodipine since 08/2019  LPN informed pt that PA has already left for the week and will return on Monday, 02-14-20, but LPN will reach out to PA regarding this in hopes that she will reply sooner. LPN will call pt back once a response is received. Pt verbalized understanding.  LPN explained to pt the importance of taking her HTN medications as they are to help control her BP and discussed the importance of not d/c her own medications without discussing with her provider. LPN also advises pt to call the office if she has any concerns regarding her health or her medications in the future. Pt verbalized understanding.  LPN advises pt to continue her current medications until LPN calls pt back with PA instructions. Pt understands LPN will contact her after discussing with PA.  Pt's preferred pharmacy is [facility] in [place]

## 2020-02-10 NOTE — Congregational Nurse Program (Signed)
Client enrolled in Remote patient monitoring for hypertension in collaboration with [facility] [place]. Client has been consistently meeting expectations with monitoring daily. I have reviewed all notes and have discussed client's progress and plan with Lissa Hoard LPN and I agree. Client is remaining on [employer] set of goal blood pressure average of 135/85 or less.  Last 7 day patient recorded remote monitoring blood pressure readings:  Last 7 day average blood pressure recorded to program dashboard 122/81  On target no critical readings noted, no symptoms noted.  Highest blood pressure reported to dashboard last 7 days : 128/81   Lowest blood pressure readings reported to dashboard last 7 days: 112/76    Plan:  Client to continue to monitor blood pressures and weights daily as discussed at enrollment.  Client to continue medication regime as prescribed by provider at [facility] Client will continue exercise program as well as following DASH diet and overall better nutrition program.  RN will continue to monitor daily reported blood pressure readings from [facility] remote monitoring equipment for any escalations or critical readings and will address accordingly to program guidelines established with provider.  RN and LPN W. Pauline Good will continue to discuss client's progress weekly.  [place]nee [place] LPN will continue health coaching.  Will continue to note 7  Day average blood pressure readings within Epic and will notify [facility] provider S. McElory PA-C of any issues regarding clients medication adherence or consistent out of target blood pressure readings per protocol.   Debria Garret RN  [facility]

## 2020-02-10 NOTE — Telephone Encounter (Signed)
Pt was f/u by text message after reviewing her weight and bp measurements from the [place].   It was noticed that patient had 2 different readings on the  Weight readings (7lbs difference) in a matter of 4 mins of each other, which was abnormal.  Other days her weight has fluctuated like this as well.    Pt stated that she used guidelines put in place before weighing herself (urinated, unclothed, uncarpeted area).   She stated she decided to stop taking both of her BP meds on 02/08/2020, due to her concern of having severe ankle swelling and feeling lethargic.  Pt states she feels much better while not taking them, since she has changed her diet, exercising more frequently and monitoring her BP more frequently.    Pt was explained the importance of taking her medicines continuously and not ceasing on own; importance of communicating her concerns/any worrisome side affects about the medications, so that it can be reported to her provider to make any adjustments, if and where needed.    Plan -Pt concerns communicated to RN Mayra Reel) to address/contact pt medical provider regarding any adjustments or advice to be given on continuing BP  meds -Pt will be contacted back to provide instructions on what she would need to do regarding the continued/discontinued medication use b

## 2020-02-14 NOTE — Telephone Encounter (Signed)
LPN called and notified pt that PA recommends for her to d/c amlodipine as is can cause swelling and to continue losartan for her BP and to continue monitoring her BP. Pt verbalized understanding.

## 2020-02-14 NOTE — Telephone Encounter (Addendum)
Per PA pt is to d/c amlodipine as it can cause swelling and is to continue losartan. Pt is to continue monitoring her BP.  LPN called pt on [phone] and left vm for pt to call back.

## 2020-02-16 ENCOUNTER — Telehealth: Payer: Self-pay

## 2020-02-16 NOTE — Congregational Nurse Program (Signed)
  Dept: [phone]   Congregational Nurse Program Note  Date of Encounter: 02/16/2020  Past Medical History: Past Medical History:  Diagnosis Date  . Abnormal mammogram of right breast 08/04/2018  . Anemia   . Anxiety   . Diabetes mellitus without complication ([place]) [id number]  . Hypertension   . Obesity   . Umbilical hernia     Encounter Details: CNP Questionnaire - 02/16/20 1555      Questionnaire   Patient Status  Not Applicable    Race  Black or African American    Location Patient Served At  [facility]  Not Applicable    Uninsured  Uninsured (Subsequent visits/quarter)    Food  No food insecurities    Housing/Utilities  Yes, have permanent housing    Transportation  No transportation needs    Interpersonal Safety  Yes, feel physically and emotionally safe where you currently live    Medication  No medication insecurities    Medical Provider  Yes    Referrals  [place] [facility] Enrollment   ED Visit Averted  Not Applicable    Life-Saving Intervention Made  Not Applicable      Remote patient monitoring program in collaboration with the [facility] of [place].   Reveiewed today's follow up note from [facility] LPN. All data reviewed along with action plan. Agree with continue consistent monitoring and plan to increase exercise to lower blood pressure and aid in weight loss goals.  NOTED: Provider follow up regarding client's complaint of leg swelling and increased tiredness. Amlodipine discontinued per provider. Client encouraged by [facility] staff including this RN and [facility] staff to report any issues or concerns with her medications with her provider before any changes made.   Last 7 day reported blood pressure readings averaging:  Average reported blood pressure readings: 121/81 On target for program goals.  Highest reported blood pressure reading : 130/82  Lowest reported blood  pressure reading: 107/75  Next follow up with [facility] scheduled for 05/03/20  Plan:  RN will continue to review report blood pressure readings daily during regular office hours and report any consistent trending escalations that cannot be managed with health coaching to provider per program guidelines. Will also monitor for any critical reported values during regular office hours and report those to provider per guidelines.  Will continue to meet and discuss weekly with W. Crowder LPN regarding client's progress and any barriers to monitoring or meeting set program goals.  Lissa Hoard LPN will continue support and health coaching per program guidelines.  RN will document every 7 day averages within [employer] record .  [facility]

## 2020-02-16 NOTE — Telephone Encounter (Signed)
Pt was f/u by phone for weekly update on today to address any challenges or successes of BP/ Weight checks/ Personal Action Plan.   States she overall continues to feel confident about the use of the equipment, however she is sometimes having connectivity issues of the BP readings being delayed to [facility] clinical portal.   States reports that her provider took off 1 of 2 BP meds that she was on (Amlodopine), due to her c/o of consistent ankle swelling and a lethargic/tired feeling that tended to be bothersome  Personal Action Plan Timeline -Taking meds daily -  On target   (now only take 1 BP meds per instructions of her provider) -Monitor BP 2 times day (am and pm)...  On target (Times fluctuate in the mornings or evenings based on daily schedule but does consistently takes BP checks based on program guidelines) -Follow Healthy Eating Plan On target  (drinks water more often, reduced fried foods, salads more often, reduced sweets to almost none)        -Monitor Weight once a day by weighing self and tracking with [name] Scale-  On target   (3 pounds less vs previous week)   -Increase Physical Activity (30 mins/ 3 days)  On [employer]  (continues same schedule of activity)   BARRIERS -Lack of Time/Schedule   GOAL REVISIONS FOR WEEK - INCREASE EXERCISE ACTIVITY LEVEL TO 4 TIMES WEEK/30 MINS  -ATTEMPT TO ADD IN EXERCISE IN THE MORNING WHILE  MAINTAINING NORMAL SCHEDULE OF EXERCISE AT AS ORIGINALLY PLANNED

## 2020-02-22 ENCOUNTER — Telehealth: Payer: Self-pay

## 2020-02-23 NOTE — Telephone Encounter (Signed)
Pt returned missed call by text message and was able to provide a short weekly update on her Personal Action Plan.  States she "declined" or has gotten off track with her routiine diet and exercise plan on this past week   Personal Action Plan Timeline -Taking meds daily -  On target   (continues to stay on schedule) -Monitor BP 2 times day (am and pm).  On target (  consistently takes BP checks based on program guidelines and 5 days a week) -Follow Healthy Eating Plan Work in Progress  (ate foods over [employer] Day holiday that typically would not have been  eaten, states she did not overindulge but items that she typically wouldn't eat)     -Monitor Weight once a day by weighing self and tracking with [name] Scale-  On target   (maintaining weight) -Increase Physical Activity (30 mins/ 3 days)  Work in Progress  (was only able to walk 2 days last week)   BARRIERS No Barriers discussed at this time of text discussion (will discuss on 6.3.21 phone discussion)   [place] NO Revisions identified at the time of text discussion  (will discuss on 6.3.21 phone discussion)

## 2020-02-28 NOTE — Congregational Nurse Program (Signed)
Remote patient monitoring program in collaboration with the [facility] of [place].   Reveiewed today's follow up note from [facility] LPN. All data reviewed along with action plan. Agree with continue consistent monitoring and plan to increase exercise to lower blood pressure and aid in weight loss goals.  NOTED: Provider follow up regarding client's complaint of leg swelling and increased tiredness. Amlodipine discontinued per provider. Client encouraged by [facility] staff including this RN and [facility] staff to report any issues or concerns with her medications with her provider before any changes made.   Last 7 day reported blood pressure readings averaging:  Average reported blood pressure readings: 125/81 On target for program goals.  Highest reported blood pressure reading : 133/83  Lowest reported blood pressure reading: 120/77  Next follow up with [facility] scheduled for 05/03/20  Plan:  RN will continue to review report blood pressure readings daily during regular office hours and report any consistent trending escalations that cannot be managed with health coaching to provider per program guidelines. Will also monitor for any critical reported values during regular office hours and report those to provider per guidelines.  Will continue to meet and discuss weekly with W. Crowder LPN regarding client's progress and any barriers to monitoring or meeting set program goals.  Lissa Hoard LPN will continue support and health coaching per program guidelines. Agree with plan to increase exercise. Client remains on [employer].  RN will document every 7 day averages within [employer] record .  [facility]

## 2020-02-29 ENCOUNTER — Telehealth: Payer: Self-pay

## 2020-02-29 NOTE — Telephone Encounter (Signed)
Pt unavailable at time of phone call.  Voice Message and Text Message left as reminder to return phone call

## 2020-03-01 ENCOUNTER — Telehealth: Payer: Self-pay

## 2020-03-01 NOTE — Telephone Encounter (Signed)
F/u phone call discussion consisted of weekly update to address challenges or successes of BP/Weight checks/Personal Action Plan.   Pt states shecontinues to have success monitoring her BP and weight, however she is still unsure about why her weight readings continuing to have a significant differences in between them at certain times.  She states she attempts to be aware of the guidelines of weight measurement and positioning that she  strictly follows.   States she continues to make small changes to her diet as it relates to the selection of foods, watching the amount of salt she uses. States her exercise is not going as well as expected due to limitations on her available time to perform it. States she only was able to perform exercise 1 day out of the week last week. States she continues to take her 1 BP medicine as directed and on time.  States she has been only checking her BP 1 time a day versus 2 time day as previously advised, due to misunderstanding of a previous conversation with the health coach.       Personal Action Goals/Revisions -Taking meds on time ON [employer] per enrollment ( Takes between 7-8am)  - Monitoring and Tracking BP ON [employer] per enrollment (before meds  Between 7-8am and around 9:00pm)    -Follow Healthy Eating Plan- ON [employer](Continues to eat salads, no fried foods on this week, with more concentration on baked and grilled  drinks at least 2- 36 oz bottles of water a day, no sodas, smoothes (ginger, pineapple, cucumbers, lemon juice in mornin), limits sweets when wanting to rice crispy treat 100 cal or less . Eats dinner by 7pm)  -Monitor Weight by weighing self and tracking with [name] Scale- On [employer] (just need to adjust how she stepping on scale)  -Increase Physical Activity- OFF [employer]  (enrollment goal was 30 mins/3 times a week)  Only performed 1 day on on last week for 30 mins.   -Reduce Stress (New Goal as of 03/01/20)- (Add 1 Self Care  activity a week)     Barriers -lack of time to find exercise -outdoor temperature to hot -taking care of others more than self  -food choice for lunch is beginning to be boring   PATIENT PERSONAL ACTION PLAN  REVISIONS/CHANGES 1. increase physical activity   -30 mins a day/ 5 times a week    (Total=150 mins)  -Do more indoor activity that is     fun for you (ex. Dancing,           make a walking path indoors -Track steps using fitness app on phone to get in 150 mins of exercise or at least 2.5 miles based on what your son told discussed with you   2. Increase following healthy eating plan by pre-planning meals        -go to grocery store more          and use foods to pre-make          meals (low sodium, quick          and easy to warm up          meals for lunch        -eat breakfast  in mornings        -eat small meal snacks         within the day (eat the         antioxidant nut snack in  between breakfast/lunch;         and between lunch/dinner       -Continue to read labels        and look for sodium levels.   3. Reduce Stress Level      -take more care of self and       less of others      -Do 1 self care activity        week of choice    4. Monitor/ check BP 2 times a           day (2 times in morning and        2 times at night ( remember        to be relaxed as much as           possible before measuring       each BP)  5.  When weighing yourself        make sure  both legs are on        scale,  stand tall, try not to        look down.... but if you do        30 for seconds before        stepping off [place] -Continue to monitor the [facility] clinical portal for patient and any elevations -Follow up with patient by Thurs 6/15  Regarding action plan

## 2020-03-02 ENCOUNTER — Telehealth: Payer: Self-pay

## 2020-03-02 NOTE — Telephone Encounter (Signed)
This encounter was created in error - please disregard.

## 2020-03-02 NOTE — Addendum Note (Signed)
Addended by: Marcelene Butte on: 03/02/2020 10:56 AM   Modules accepted: Level of Service, SmartSet

## 2020-03-02 NOTE — Telephone Encounter (Signed)
erraneous encounter created by default

## 2020-03-06 ENCOUNTER — Telehealth: Payer: Self-pay

## 2020-03-06 NOTE — Congregational Nurse Program (Signed)
Remote patient monitoring program in collaboration with the [facility] of [place].   Reveiewed today's follow up note from [facility] LPN. All data reviewed along with action plan. Agree with continue consistent monitoring and plan to increase exercise to lower blood pressure and aid in weight loss goals.   Last 7 day reported blood pressure readings averaging:  Average reported blood pressure readings: 1131/83  On target for program goals.  Highest reported blood pressure reading : 136/84  Lowest reported blood pressure reading: 125/83  [facility] for 05/03/20  Plan:  RN will continue to review report blood pressure readings daily during regular office hours and report any consistent trending escalations that cannot be managed with health coaching to provider per program guidelines. Will also monitor for any critical reported values during regular office hours and report those to provider per guidelines.  Will continue to meet and discuss weekly with W. Crowder LPN regarding client's progress and any barriers to monitoring or meeting set program goals.  Lissa Hoard LPN will continue support and health coaching per program guidelines. Agree with plan to increase exercise. Client remains on [employer].  Client will plan to increase activity as planned in personal action plan. See Lissa Hoard LPN notes.  Debria Garret RN [facility]/Clara Sandi Mariscal

## 2020-03-07 NOTE — Telephone Encounter (Signed)
F/u phone call discussion consisted of weekly update to address challenges or successes of BP/Weight checks/Personal Action Plan.   Pt states shecontinues to have success monitoring her BP and weight with no issues related to equipment compared to last week and previous weeks. States she continues to take her BP medicine on time and denies any side affects/complications.  States she picked up 1 self care activity on last week (reading the bible)  She was happy to state her success of reducing her portion sizes to half of what she normally eats and maintaining her same choice of foods as previous week.   In addition she states another success of challenging herself with her walking and was able to walk least  5 out of the 7 days .      Personal Action Goals/Revisions -Taking meds on timeON Targetper enrollment (Takes between 7-8am)  - Monitoring and Tracking BP ON [employer] per enrollment (before meds  Between 7-8am and around 9:00pm)   -Follow Healthy Eating Plan- ON [employer](Continues to eatsalads, no fried foods on this week, with more concentration on baked and grilled drinks at least 2- 36 oz bottles of water a day, no sodas mandarian oranges and nut mix (raisins, almonds, cranberries, blueberries,  walnuts and pumpkin seeds)   Eat dinner by 7pm; Continue to read labels and look for sodium levels; eat small meals and snacks via out day. Added :  cut portion sizes into half compared to normal)  -Monitor Weight by weighing self and tracking with [name] Scale- On [employer]  (was more mindful of how she stepped on scale and avoided looking down at scale while it was registering)  -Increase Physical Activity- OFF [employer]  (enrollment goal was 30 mins/5 times a week)  Missed 2 days out of 5 days  -Reduce Stress (New Goal as of 03/01/20)- (Add 1 Self Care activity a week) - ON TARGET (started reading bible as of 03/03/20)     Barriers -taking care of others more than self   -food choice for lunch continues to be boring  -lack of motivation to go to grocery store on last week to get foods to pre-make lunch meals   PATIENTPERSONAL ACTION PLANREVISIONS/CHANGES 1.increase physical activity     Continue to challenge self to get in 5 DAYS A WEEK  2. Increase following healthy eating plan by pre-planning meals    -Work on going to to grocery store more      and use foods to pre-make      meals (low sodium, quick      and easy to warm up      meals for lunch           [place] to monitor the [facility] clinical portal for patient and any elevations -Follow up with patient by Monday, 03/13/20 for weekly updates regarding action plan

## 2020-03-08 NOTE — Congregational Nurse Program (Signed)
Remote patient monitoring program in collaboration with the [facility] of [place].   Reveiewed 03/06/20 follow up note from [facility] LPN. All data reviewed along with action plan. Agree with continue consistent monitoring and plan to increase exercise to lower blood pressure and aid in weight loss goals.   Last 7 day reported blood pressure readings averaging:  Average reported blood pressure readings: 124/83  On target for program goals.  Highest reported blood pressure reading : 131/82  Lowest reported blood pressure reading: 113/78  [facility] with [facility] scheduled for 05/03/20  Plan:  RN will continue to review report blood pressure readings daily during regular office hours and report any consistent trending escalations that cannot be managed with health coaching to provider per program guidelines. Will also monitor for any critical reported values during regular office hours and report those to provider per guidelines.  Will continue to meet and discuss weekly with W. Crowder LPN regarding client's progress and any barriers to monitoring or meeting set program goals.  Lissa Hoard LPN will continue support and health coaching per program guidelines.Agree with plan to increase exercise. Client remains on [employer].  Client will plan to increase activity as planned in personal action plan and monitor and adjust portion sizes per plan.Client will also have a goal of one self care activity once weekly to reduce stress. See Lissa Hoard LPN notes.  Debria Garret RN [facility]/Clara Sandi Mariscal

## 2020-03-16 ENCOUNTER — Telehealth: Payer: Self-pay

## 2020-03-16 NOTE — Congregational Nurse Program (Signed)
Remote patient monitoring program in collaboration with the [facility] of [place].   Reveiewed 03/16/20 follow up note from [facility] LPN. All data reviewed along with action plan. Agree with continue consistent monitoring and plan to increase exercise to lower blood pressure and aid in weight loss goals. Client is incorporating time for self care and has introduced diet changes.   Last 7 day reported blood pressure readings averaging:  Average reported blood pressure readings: 126/82On target for program goals.  Highest reported blood pressure reading : 139/89  Lowest reported blood pressure reading: 108/81  [facility] with [facility] for 05/03/20  Plan:  RN will continue to review report blood pressure readings daily during regular office hours and report any consistent trending escalations that cannot be managed with health coaching to provider per program guidelines. Will also monitor for any critical reported values during regular office hours and report those to provider per guidelines.  Will continue to meet and discuss weekly with W. Crowder LPN regarding client's progress and any barriers to monitoring or meeting set program goals.  Lissa Hoard LPN will continue support and health coaching per program guidelines.Agree with plan to increase exercise. Client remains on [employer].  Client will plan to increase activity as planned in personal action plan and monitor and adjust portion sizes per plan.Client will also have a goal of one self care activity once weekly to reduce stress (ON [employer]). See Lissa Hoard LPN notes.  Debria Garret RN [facility] Sandi Mariscal

## 2020-03-16 NOTE — Telephone Encounter (Addendum)
F/u weekly phonecall with patient to address challenges or successes of BP/Weight checks/Personal Action Plan.   Pt statesshecontinues to havesuccess monitoring her BP and weight with no issues related to equipment use.  . States she continues to take her BP medicine on time and denies any side affects/complications.  States her BP has been a little elevated this past week/last week due to abdominal/cramp pain that she has been experiencing within the week.  States that it has improved on today and has noticed the BP has decreased a little since feeling better   States she added a change in her diet by replacing more of her routine unhealthy starchy foods (french fries)  and added more vegetables to her plate.   For the most part, she states continues to be on the right track with her food choices and are unchanged from the previous week, with the exception of what she has added.   She states her exercise consisted of 90 mins of exercise on last week (83mins/3 days)   Personal Action Goals/Revisions -Taking meds on timeON Targetperenrollment(Takes between 7-8am)  - Monitoring and Tracking BP ON [employer] per enrollment (before meds Between 7-8am and around 9:00pm)   -Follow Healthy Eating Plan- ON [employer](Continues to eatsalads, no fried foods on this week, with more concentration on baked and grilled drinks at least 2- 36 oz bottles of water a day, no sodas mandarian oranges and nut mix (raisins, almonds, cranberries, blueberries,  walnuts and pumpkin seeds)   Eat dinner by 7pm; Continue to read labels and look for sodium levels; eat small meals and snacks via out day. Replaced unhealthy starchy foods (french fries) with more vegetables  -Monitor Weight by weighing self and tracking with [name] Scale- On [employer]    -Increase Physical Activity- OFF [employer] but IMPROVING (enrollment goal was 30 mins/5 times a week)  Missed 2 days out of 5 days but did due 90 mins  (52mins/3days)  -Reduce Stress (New Goal as of 03/01/20)- (Add 1 Self Care activity a week)- ON [employer] (Continues to commit to reading bible at least once a week, adding getting more sleep and journaling health activity and BP activity)   Barriers -Not feeling well with contributed to lack on motivation    PATIENTPERSONAL ACTION PLANREVISIONS/CHANGES/SUCCESSES 1.Continue to increase physical activity      Continue to challenge self to get in 5 DAYS A WEEK  2. Increase following healthy eating plan by pre-planningmeals -Work on going to to grocery store more  and use foods to pre-make  meals (low sodium, quick  and easy to warm up  meals for lunch   3.  Continue with Journaling in [place] (Activities related to Remote Monitoring of BP and Personal Action Plan Goals)   Health Coach Plan -Continue to monitor the [facility] clinical portal for patient and any elevations -Follow up with patient by Monday, 03/20/20 for weekly updates regarding action plan

## 2020-03-16 NOTE — Congregational Nurse Program (Signed)
Remote Monitoring Hypertension patient requested places to receive fresh fruits and vegetables to assist with making better food choices and management of hypertension

## 2020-03-20 ENCOUNTER — Telehealth: Payer: Self-pay

## 2020-03-20 NOTE — Telephone Encounter (Addendum)
F/u weekly phonecall with patient to address challenges or successes of BP/Weight checks/Personal Action Plan.   Pt statesshecontinues to havesuccess monitoring her BP and weightwith no issues related to equipment use.  . States she continues to take her BP medicine on time and denies any side affects/complications. States her success for the week was purchasing a walking video to perform indoors and pre-planning heatlhy meals to include more fruits and vegetables.   Personal Action Goal Timeline -Taking meds on timeON Targetperenrollment(Takes between 7-8am)  - Monitoring and Tracking BP ON [employer] per enrollment (before meds Between 7-8am and around 9:00pm)   -Follow Healthy Eating Plan- ON [employer](Continues to eatsalads, no fried foods, with more concentration on baked and grilled drinks at least 2- 36 oz bottles of water a day, no sodasmandarian oranges and nut mix (raisins, almonds, cranberries, blueberries, walnuts and pumpkin seeds) Eatdinner by 7pm;Continue to read labels and look for sodium levels; eat small meals and snacks via out day. Continue to replacing unhealthy starchy foods (french fries) with more vegetables. Have been going to grocery store and  obtainig more fruits and vegetables and using and use as part of pre-planning meals throughout the week.  Implemented more cooking at homes and preplanning on meals for example (cooking on Mondays) and re-warm left overs that were prepared at home  -Increase Physical Activity-OFFTarget but IMPROVING (enrollment goal was 30 mins/5times a week)Conducted 30 min Walking Exercise Video and 25 mins of walking activity about   time -Reduce Stress (New Goal as of 03/01/20)- (Add 1 Self Care activity a week)- ON [employer](Continues to commit to reading bible at least once a week, adding getting more sleep and journaling health activity and BP activity)  Barriers NONE   PATIENTPERSONAL ACTION  PLANREVISIONS/CHANGE/ 1.Continue to increase physical activity   By using more of the new 30 min walking video to be used more within indoors.  Continue to challenge self to get in 5 DAYS A WEEK  2. Continue to Increase following healthy eating plan by pre-planningmeals adding more fruits and vegetables   3.  Pick back up with Journaling in [place] (Activities related to Remote Monitoring of BP and Personal Action Plan Goals)   Health Coach Plan -Continue to monitor the [facility] clinical portal for patient and any elevations  -Follow up with patient by  03/24/20 for every 4 day weekly updates regarding action plan

## 2020-03-28 NOTE — Congregational Nurse Program (Signed)
Remote patient monitoring program in collaboration with the [facility] of [place].   Revieweded6/29/21follow up note from [facility] LPN. All data reviewed along with action plan. Agree with continue consistent monitoring and plan to increase exercise to lower blood pressure and aid in weight loss goals. Client is incorporating time for self care and has introduced diet changes. client has also purchased and incorporated a walking exercise video in her daily routine.  Last 7 day reported blood pressure readings averaging:  Average reported blood pressure readings:132/84On target for program goals.  Highest reported blood pressure reading : 144/88  Lowest reported blood pressure reading: 122/82  [facility] for 05/03/20  Plan:  RN will continue to review report blood pressure readings daily during regular office hours and report any consistent trending escalations that cannot be managed with health coaching to provider per program guidelines. Will also monitor for any critical reported values during regular office hours and report those to provider per guidelines.  Will continue to meet and discuss weekly with W. Crowder LPN regarding client's progress and any barriers to monitoring or meeting set program goals.  Lissa Hoard LPN will continue support and health coaching per program guidelines.Agree with plan to increase exercise. Client remains on [employer].  Client will plan to increase activity as planned in personal action planand monitor and adjust portion sizes per plan.Client will also have a goal of one self care activity once weekly to reduce stress (ON [employer]). See Lissa Hoard LPN notes.  Debria Garret RN [facility]/Clara Sandi Mariscal

## 2020-03-29 ENCOUNTER — Telehealth: Payer: Self-pay

## 2020-03-29 NOTE — Telephone Encounter (Addendum)
Pt unavailabe at the time of phone call to provide weekly update for remote monitoring hypertension progress

## 2020-03-30 ENCOUNTER — Telehealth: Payer: Self-pay

## 2020-03-30 NOTE — Telephone Encounter (Signed)
F/uweeklyphonecallwith patient to address challenges or successes of BP/Weight checks/Personal Action Plan.   Pt statesshecontinues to havesuccess monitoring her BP and weightwith no issues related to equipmentuse.. States she continues to take her BP medicine  denies any side affects/complications.   States her success for the week was purchasing a walking video to perform indoors and pre-planning heatlhy meals to include more fruits and vegetables.   Personal Action Goal Timeline -Taking meds on timeON Targetperenrollment(Takes between 7-8am)  - Monitoring and Tracking BP ON [employer] per enrollment (before meds Between 7-8am and around 9:00pm)   -Follow Healthy Eating Plan- ON [employer](Continues to eatsalads, no fried foods, with more concentration on baked and grilled drinks at least 2- 36 oz bottles of water a day, no sodasmandarian oranges and nut mix (raisins, almonds, cranberries, blueberries, walnuts and pumpkin seeds) Eatdinner by 7pm;Continue to read labels and look for sodium levels; eat small meals and snacks via out day. Continue to replacing unhealthy starchy foods (french fries)with more vegetables. Have been going to grocery store and  obtainig more fruits and vegetables and using and use as part of pre-planning meals throughout the week (limited doing this on this week).  Implemented more cooking at homes and preplanning on meals for example (cooking on Mondays) and re-warm left overs that were prepared at home; (New: utilized neighbors garden to get access to more vegetables    -Increase Physical Activity-ONTARGET (enrollment goal was 30 mins/5times a week)  Kept up walking steps 2 days a week.  3741 steps.   Did not receive opportunity to do walking video due to lack of time and work on last week   -Reduce Stress  ON [employer](Continues to commit toreading bible at least once a week,  (Has not been able to conduct journaling on this week   ; (New) Went to [employer] this week);    Barriers -Altered work schedule that has caused her to work more than normal in turn causing her to be more tired and stressed, while overall causing less time to conduct her routine to maintain less stress  Successes-Maintaining weight (by not gaining any extra) -Continuing to control portion sizes and not overindulging   PATIENTPERSONAL ACTION PLANREVISIONS/CHANGE/ 1.Continue toincrease physical activity    Using fitness app to keep up with steps and physical activity  2. Continue to find ways to increase adding more fruits and vegetables    Health Coach Plan -Continue to monitor the [facility] clinical portal for patient and any elevations  -Follow up with patient by 7/15//21 for every 4 day weekly updates regarding action plan

## 2020-03-31 NOTE — Congregational Nurse Program (Signed)
Review of client's last 7 day averages as of 03/29/20 per [employer] portal for remote patient monitoring for hypertension. Reviewed Wilhemenia Crowder's note from 03/30/20 with client. Client is on target and is continuing to work to make positive lifestyle changes with diet and exercise. She is on target for program guidelines as well as her personal action plan.  As of 03/29/20 last 7 day blood pressure averages from recordings on [employer] portal.  Last 7 day average reported reading 130/83 on target Last 7 day average highest reading:137/84 Last 7 day average lowest reading: 122/82  Plan: Continue to monitor portal for readings daily to note any trends or escalations and report to provider at [facility] as needed per program guidelines. Continue to meet weekly with W. Crowder LPN to discuss client's progress and any barriers to meeting goals.  Client is consistently meeting target goals as per program guidelines and personal action plan. She is incorporating walking videos to use indoors. She is adding fresh vegetables and is meal planning and prepping to help her meet her diet goals.  Debria Garret RN

## 2020-04-05 ENCOUNTER — Other Ambulatory Visit: Payer: Self-pay | Admitting: Physician Assistant

## 2020-04-07 NOTE — Congregational Nurse Program (Signed)
Review of client's remote monitoring readings for the [employer] program in collaboration with [facility] [place]. Client continues to be consistent with her readings and on target. There Have been no escalations and no interventions have been required.   Last 7 days reported recordings uploaded to the platform are as follows:  Last 7 day Average recorded reading: 131/81 On target  Last 7 day average highest recorded reading: 139/83  Last 7 day average lowest recorded reading: 113/76  Plan: Will continue daily monitoring of [employer] platform for client's readings and note any trending escalations that would require intervention per program guidelines.  Will continue to meet weekly with W. Crowder LPN to discuss client's progress and any changes in the client's personal action plan or any barriers to client meeting her personal and program goals.  Client will continue to receive health coaching from [facility] LPN per program guidelines for support and changes in goals of personal action plan regarding diet, activity and medication and monitoring adherence.   Debria Garret RN  [facility] [employer]

## 2020-04-09 ENCOUNTER — Other Ambulatory Visit: Payer: Self-pay | Admitting: Physician Assistant

## 2020-04-09 DIAGNOSIS — E785 Hyperlipidemia, unspecified: Secondary | ICD-10-CM

## 2020-04-09 DIAGNOSIS — D649 Anemia, unspecified: Secondary | ICD-10-CM

## 2020-04-09 DIAGNOSIS — E119 Type 2 diabetes mellitus without complications: Secondary | ICD-10-CM

## 2020-04-09 DIAGNOSIS — I10 Essential (primary) hypertension: Secondary | ICD-10-CM

## 2020-04-18 NOTE — Congregational Nurse Program (Signed)
7 day averages for [name] remote patient monitoring program.  Average 7 day recorded readings are as follows:  129/82 7 day averaged reading ( 13 readings in 7 days) ON TARGET  138/87 highest averaged reading last 7 days.  113/76 lowest averaged reading last 7 days.  Plan: Client is consistently meeting on target goals for monitoring per personal action plan as determined with patient and Lissa Hoard LPN health coach.  Client will continue health coaching with W. Croweder LPN as determined weekly.  RN will continue to monitor readings daily for trending escalations that would require interventions.  RN and Marnee Spring LPN will continue to meet and discuss client's progress weekly and address any barriers.   Debria Garret RN  [facility] [employer]

## 2020-04-28 NOTE — Congregational Nurse Program (Signed)
Last 7 day average report for Remote patient monitoring [facility] for hypertension in [facility] with [facility]. Client has been monitoring per set schedule established with Lissa Hoard LPN.     Last 7 day averages recorded readings:  Last 7 day average recorded reading: 131/83 on target Last 7 day average highest recorded reading: 147/80 Last 7 day average lowest recorded readings: 113/76  Plan: RN will continue to daily monitor [employer] dashboard for any escalations or trends that would require intervention per program guideline  Client will continue with health coaching weekly by Lissa Hoard LPN and continue to monitor consistently per program personal action plan. She will continue to work on goals set such as diet and exercise per personal action plan.  RN will continue to meet weekly with W. Crowder LPN to discuss client progress and any barriers to meeting goals or program.  [place]

## 2020-05-01 ENCOUNTER — Telehealth: Payer: Self-pay

## 2020-05-01 ENCOUNTER — Other Ambulatory Visit (HOSPITAL_COMMUNITY)
Admission: RE | Admit: 2020-05-01 | Discharge: 2020-05-01 | Disposition: A | Discharge status: Home / Self Care | Payer: No Typology Code available for payment source | Source: Ambulatory Visit | Attending: Physician Assistant | Admitting: Physician Assistant

## 2020-05-01 ENCOUNTER — Other Ambulatory Visit: Payer: Self-pay

## 2020-05-01 DIAGNOSIS — I10 Essential (primary) hypertension: Secondary | ICD-10-CM | POA: Insufficient documentation

## 2020-05-01 DIAGNOSIS — E785 Hyperlipidemia, unspecified: Secondary | ICD-10-CM | POA: Insufficient documentation

## 2020-05-01 DIAGNOSIS — D649 Anemia, unspecified: Secondary | ICD-10-CM | POA: Insufficient documentation

## 2020-05-01 DIAGNOSIS — E119 Type 2 diabetes mellitus without complications: Secondary | ICD-10-CM | POA: Insufficient documentation

## 2020-05-01 LAB — COMPREHENSIVE METABOLIC PANEL
ALT: 20 U/L (ref 0–44)
AST: 16 U/L (ref 15–41)
Albumin: 3.7 g/dL (ref 3.5–5.0)
Alkaline Phosphatase: 55 U/L (ref 38–126)
Anion gap: 11 (ref 5–15)
BUN: 12 mg/dL (ref 6–20)
CO2: 27 mmol/L (ref 22–32)
Calcium: 9.2 mg/dL (ref 8.9–10.3)
Chloride: 100 mmol/L (ref 98–111)
Creatinine, Ser: 0.81 mg/dL (ref 0.44–1.00)
GFR calc Af Amer: >60 mL/min (ref >60)
GFR calc non Af Amer: >60 mL/min (ref >60)
Glucose, Bld: 170 mg/dL — ABNORMAL HIGH (ref 70–99)
Potassium: 4 mmol/L (ref 3.5–5.1)
Sodium: 138 mmol/L (ref 135–145)
Total Bilirubin: 0.4 mg/dL (ref 0.3–1.2)
Total Protein: 7.7 g/dL (ref 6.5–8.1)

## 2020-05-01 LAB — LIPID PANEL
Cholesterol: 165 mg/dL (ref 0–200)
HDL: 35 mg/dL — ABNORMAL LOW (ref >40)
LDL Cholesterol: 100 mg/dL — ABNORMAL HIGH (ref 0–99)
Total CHOL/HDL Ratio: 4.7 RATIO
Triglycerides: 150 mg/dL — ABNORMAL HIGH (ref <150)
VLDL: 30 mg/dL (ref 0–40)

## 2020-05-01 LAB — HEMOGLOBIN A1C
Hgb A1c MFr Bld: 7.5 % — ABNORMAL HIGH (ref 4.8–5.6)
Mean Plasma Glucose: 168.55 mg/dL

## 2020-05-01 LAB — HEMOGLOBIN AND HEMATOCRIT, BLOOD
HCT: 35.3 % — ABNORMAL LOW (ref 36.0–46.0)
Hemoglobin: 10.2 g/dL — ABNORMAL LOW (ref 12.0–15.0)

## 2020-05-01 NOTE — Telephone Encounter (Addendum)
This a weekly f/u and update for week of 04/11/2020 as  related to remote monitoring hypertension program.  Pt  was available to speak and complete update on this day.Marland KitchenMarland Kitchen7/20/2021  Pt stated her Successes for week was   1. Purchased pill container to serve as reminder to stay on track with taking all meds 2. Schedule maintenance has been much better this week as relates to time, getting up on time to get meds and monitoring BP and weigh requirements in order to stay on track 3.  Continues to  Pre-planning meals for dinner 4  Continues with keeping stress level down by journaling thoughts in her "[place] entitled "Becoming",  Daily bible     reading bible, get more sleep than normal 5. Added a vitam in supplement to assist with relaxing more and helping with getting more sleep 6. Keeps up with steps with fitness app device on phone   Barriers for the week   1. Time managerment as far as  job, personal life, and monitoring bp and weight   2. Not participating in exercise as should       ACTION PLAN Goals for upcoming week  1. Commit to getting ou pat 6am versus 7am  2.  Commit to working out about 4 days a week for mins  3.   Increase daily steps(making all movement count) to 4500 steps a day   Pt stated she understood new goals for the week and phone call ended

## 2020-05-01 NOTE — Telephone Encounter (Addendum)
This a weekly f/u and update for week of 04/05/2020 as  related to remote monitoring hypertension program.  Pt  was available to speak and complete update on this day.Amy KitchenMarland Kitchen7/14/2021  Pt stated her Successes for week was  1.  conducting more meal preparation at home for purposes of having dinner pre-planned 2.  continuing to get use to eating healthier, has become easier 3. Maintaining current weight with a 1 or so lost since being enrolled in program   Barriers for the week  1. Need to include more time management as it relates to adjusting to changes such as job, personal life, etc.  2. Not waking up early enough in mornings to weigh self, take BP  3. Participation in a limited amount of exercise compared to normal   ACTION PLAN Goals for upcoming week  1.  Check BP 3 times a week as agreed (Fri, Sat and Sun)  for this week  2.  Return to taking BP medicine at designated time as stated in initial personal action plan  3.  Purchase a pill bos to keep meds in for when being on the go and to serve as reminder to take pills  4. Utilize Planner to plan out schedule more for week to serve as reminder of important dates and activities  5  Increase daily steps(making all movement count) to 4,000 steps a day  Pt stated she understood new goals for the week and phone call ended

## 2020-05-01 NOTE — Telephone Encounter (Signed)
This a weekly f/u and update for week of 04/18/2020 as  related to remote monitoring hypertension program.  Pt  was available to speak via text message on this day (04/18/2020)  Pt was advised that some of her readings did not appear on the clinical dashboard for [facility] and that readings should total 12 for the week.  Pt states she had fallen off monitoring program, but does have plans to increase monitoring behavior.  She states she continues with working on barriers of time management as ongoing goal and that the pill box seems to be working to keep her on taking her meds.   ACTION PLAN Goals for upcoming week  1. Work on obtaining 12 readings as previously agreed to get on 3 day a week (Tues, Thurs Sun)  2.  Commit to continuing to attempt to work out about 4 days a week for 68mins   3.   Increase daily steps(making all movement count) to 4500 steps a day    Pt stated she understood new goals for the week and phone call ended

## 2020-05-01 NOTE — Telephone Encounter (Signed)
Attempted to follow-up with patient by phone call and text message regarding getting a weekly update related to the Remote monitoring Hypertension Program, however the patient was unavailable at the time.  Will attempt to do a follow up call on 05/02/20.

## 2020-05-02 ENCOUNTER — Encounter: Payer: Self-pay | Admitting: Physician Assistant

## 2020-05-03 ENCOUNTER — Encounter: Payer: Self-pay | Admitting: Physician Assistant

## 2020-05-03 ENCOUNTER — Ambulatory Visit: Payer: Medicaid Other | Admitting: Physician Assistant

## 2020-05-03 DIAGNOSIS — R059 Cough, unspecified: Secondary | ICD-10-CM

## 2020-05-03 DIAGNOSIS — E669 Obesity, unspecified: Secondary | ICD-10-CM

## 2020-05-03 DIAGNOSIS — E119 Type 2 diabetes mellitus without complications: Secondary | ICD-10-CM

## 2020-05-03 DIAGNOSIS — R0981 Nasal congestion: Secondary | ICD-10-CM

## 2020-05-03 DIAGNOSIS — I10 Essential (primary) hypertension: Secondary | ICD-10-CM

## 2020-05-03 DIAGNOSIS — E785 Hyperlipidemia, unspecified: Secondary | ICD-10-CM

## 2020-05-03 DIAGNOSIS — D649 Anemia, unspecified: Secondary | ICD-10-CM

## 2020-05-03 MED ORDER — GLIPIZIDE 5 MG PO TABS: 5.0000 mg | ORAL_TABLET | Freq: Two times a day (BID) | ORAL | 4 refills | Status: DC

## 2020-05-03 NOTE — Progress Notes (Signed)
There were no vitals taken for this visit.   Subjective:    Patient ID: Amy Cardenas, female    DOB: 04-05-1971, 49 y.o.   MRN: [id number]  HPI: Amy Cardenas is a 49 y.o. female presenting on 05/03/2020 for Diabetes, Hypertension, Hyperlipidemia, Anemia, and Cough (pt's daughter was sick and pt got her "cold" pt's duagther tested negative for covid but pt has not been tested.)   HPI    This is a telemedicine appointment through [employer] due to coronavirus pandemic.  I connected with  Amy Cardenas on 05/03/20 by a video enabled telemedicine application and verified that I am speaking with the correct person using two identifiers.   I discussed the limitations of evaluation and management by telemedicine. The patient expressed understanding and agreed to proceed.  Pt is in her parked car and provider is in office.     Pt was scheduled for in office appointment but this was changed to virutal due to pt is sick.  She has been sick for 1 week as above.  Pt has been vaccinated for covid.  She is doing well other than the cold.  She is enjoying the BP monitoring program.  Her avg bp- 130/82, high 147/80 and low 113/76.      Relevant past medical, surgical, family and social history reviewed and updated as indicated. Interim medical history since our last visit reviewed. Allergies and medications reviewed and updated.   Current Outpatient Medications:    albuterol (PROVENTIL HFA;VENTOLIN HFA) 108 (90 Base) MCG/ACT inhaler, Inhale 1 puff into the lungs every 6 (six) hours as needed for wheezing or shortness of breath., Disp: , Rfl:    APPLE CIDER VINEGAR PO, Take 1 tablet by mouth in the morning and at bedtime., Disp: , Rfl:    ASHWAGANDHA PO, Take 2 tablets by mouth daily., Disp: , Rfl:    Cholecalciferol (VITAMIN D3 PO), Take 50 mcg by mouth daily., Disp: , Rfl:    Ferrous Sulfate (IRON PO), Take by mouth., Disp: , Rfl:    glipiZIDE (GLUCOTROL) 5 MG tablet, TAKE 1 TABLET BY  MOUTH ONCE DAILY BEFORE  BREAKFAST, Disp: 30 tablet, Rfl: 1   losartan (COZAAR) 100 MG tablet, Take 1 tablet by mouth once daily, Disp: 30 tablet, Rfl: 1   amLODipine (NORVASC) 5 MG tablet, Take 1 tablet (5 mg total) by mouth daily. (Patient not taking: Reported on 05/03/2020), Disp: 30 tablet, Rfl: 4   Ascorbic Acid (VITA-C PO), Take 2 tablets by mouth daily. (Patient not taking: Reported on 05/03/2020), Disp: , Rfl:     Review of Systems  Per HPI unless specifically indicated above     Objective:    There were no vitals taken for this visit.  Wt Readings from Last 3 Encounters:  01/31/20 (!) 328 lb (148.8 kg)  01/26/20 (!) 321 lb (145.6 kg)  04/22/19 (!) 309 lb (140.2 kg)    Physical Exam Constitutional:      General: She is not in acute distress. HENT:     Head: Normocephalic and atraumatic.  Pulmonary:     Effort: No respiratory distress.  Neurological:     Mental Status: She is alert and oriented to person, place, and time.  Psychiatric:        Behavior: Behavior normal.     Results for orders placed or performed during the hospital encounter of 05/01/20  Hemoglobin and hematocrit, blood  Result Value Ref Range   Hemoglobin 10.2 (L) 12.0 -  15.0 g/dL   HCT 35.3 (L) 36 - 46 %  Lipid panel  Result Value Ref Range   Cholesterol 165 0 - 200 mg/dL   Triglycerides 150 (H) <150 mg/dL   HDL 35 (L) >40 mg/dL   Total CHOL/HDL Ratio 4.7 RATIO   VLDL 30 0 - 40 mg/dL   LDL Cholesterol 100 (H) 0 - 99 mg/dL  Hemoglobin A1c  Result Value Ref Range   Hgb A1c MFr Bld 7.5 (H) 4.8 - 5.6 %   Mean Plasma Glucose 168.55 mg/dL  Comprehensive metabolic panel  Result Value Ref Range   Sodium 138 135 - 145 mmol/L   Potassium 4.0 3.5 - 5.1 mmol/L   Chloride 100 98 - 111 mmol/L   CO2 27 22 - 32 mmol/L   Glucose, Bld 170 (H) 70 - 99 mg/dL   BUN 12 6 - 20 mg/dL   Creatinine, Ser 0.81 0.44 - 1.00 mg/dL   Calcium 9.2 8.9 - 10.3 mg/dL   Total Protein 7.7 6.5 - 8.1 g/dL   Albumin 3.7  3.5 - 5.0 g/dL   AST 16 15 - 41 U/L   ALT 20 0 - 44 U/L   Alkaline Phosphatase 55 38 - 126 U/L   Total Bilirubin 0.4 0.3 - 1.2 mg/dL   GFR calc non Af Amer >60 >60 mL/min   GFR calc Af Amer >60 >60 mL/min   Anion gap 11 5 - 15      Assessment & Plan:    Encounter Diagnoses  Name Primary?   Cough Yes   Head congestion    Diabetes mellitus without complication (HCC)    Essential hypertension    Hyperlipidemia, unspecified hyperlipidemia type    Obesity, unspecified classification, unspecified obesity type, unspecified whether serious comorbidity present    Anemia, unspecified type      -Reviewed labs with pt -encouraged pt to Get tested for covid.  She can sign up for free test at [email or web address] -will Increase glipidize to bid  Continue other meds -Counseled on weight mgt including need for frequent smaller meals and regular exercise -mammogram due in november -pt to follow up 3 months.  She is to contact office sooner prn

## 2020-05-09 NOTE — Congregational Nurse Program (Signed)
[name] Remote patient monitoring for hypertension Last 7 day reported blood pressure averages. Program in collaboration with [facility]. *Note blood pressure logs of welch allyn readings provided to provider at [facility] for client's appointment on 05/03/20. Client is following personal action plan and making lifestyle changes in regards to diet, stress reduction and increasing activity.  Last 7 day averages from recorded readings from platform;  Last 7 day average 124/79 on target Last 7 day highest recorded average blood pressure: 130/81 Last 7 day lowest recorded average blood pressure: 119/76   Last 30 days averages based on 27 readings recorded  30 day average 128/81 30 day highest average 147/80 30 day lowest average 113/76   PLAN: RN will continue to monitor the welch allyn dashboard daily during clinic hours per program guidelines to assess for any escalations or trends requiring intervention.  Client will continue to have health coaching sessions and follow ups by phone with Lissa Hoard LPN and review personal action plan and update goals.  RN will continue to meet weekly with W. Crowder LPN to discuss client's progress and any potential barriers to meeting goals of program as well as personal action plans.   Debria Garret RN  [facility]

## 2020-05-11 ENCOUNTER — Telehealth: Payer: Self-pay

## 2020-05-11 NOTE — Telephone Encounter (Addendum)
A follow up return  phone call was conducted on today  05/11/20 with the patient successfully to provide a weekly follow up related to the remote monitoring hypertension program.  Pt states overall she continues to work on her time management which has been difficult especially with evening bp checks.  States she continues to watch her portion sizes, take her medicine on time.    States she has been under a little stress with preparing to take her phlebotomy exam, but she is maintaining.    Barriers:  -Time managaement when it comes to fitting in All bp checks within the 3 days   Successes -Monitoring of portion sizes -Increased movement steps to averaging 5000 daily and continues to fit in "30 min" walking club video in throughout the week -Continues to utilize her pill box to remember to take her BP meds and on time  Action plan -Continue to work on time management as it relates bp and weight checks  -continue to work on increasing exercise activity to get to 4-5 times a week for 88mins equalling to 131mins

## 2020-05-11 NOTE — Telephone Encounter (Signed)
Text message was left for patient to follow up regarding providing a weekly update for remote monitoring hypertension program by phone call or text

## 2020-05-12 NOTE — Congregational Nurse Program (Signed)
Remote patient monitoring 7 day averaging with welch allyn in collaboration with [facility]. Client is meeting goals of consistent monitoring and medication adherence per program guidelines and personal action plan developed with Lissa Hoard LPN. Client is actively working toward lifestyle changes with exercise, diet and stress reduction.  Last 7 day averages from reported readings  Last 7 day average : 124/81 on target Last 7 day average highest reading : 131/84 Last 7 day average lowest reading: 113/74   Plan:  RN will continue to monitor daily reported recordings from [place] during clinic hours per program guidelines. Assessing any escalations, or trends that per program guidelines require intervention.  Client will continue to have weekly health coaching sessions and support from [facility] LPN and discuss any needed changes to Personal Action Plans.  RN will continue to meet weekly to discuss client's progress and any potential barriers to meeting program goals.   Debria Garret RN  [facility]

## 2020-05-19 NOTE — Congregational Nurse Program (Signed)
[name] remote monitoring program for hypertension in collaboration with [facility] last 7 day blood pressure averages. Client is consistently monitoring and is actively working on her time management to include PM readings. She continues to work on her diet and portion control along with increasing activity.  Last 7 day averages of recorded blood pressure readings:  Last 7 day average of blood pressure readings: 123/79 on target. Highest recorded reading reported last 7 days : 135/84 Lowest recorded reading reported last 7 days: 104/67  Plan:  RN will continue to monitor blood pressure recordings on dashboard daily during clinic hours per program guidelines to assess for any escalations or trends that would require intervention.  Client will continue with ongoing health coaching and support with Lissa Hoard LPN to review  goals and make any necessary changes to personal action plan.  RN will continue to meet and discuss weekly with W. Crowder LPN client's progress and address any potential barriers to client meeting goals. Will provide blood pressures logs to [facility] provider at next scheduled follow up appointment.  Debria Garret RN  [facility] [employer]

## 2020-05-25 ENCOUNTER — Telehealth: Payer: Self-pay

## 2020-05-25 NOTE — Telephone Encounter (Signed)
Conversation recorded per  weekly f/u update with conducted with patient by text message  05/15/2020  Successes -Exercised at least 3 days on last week, although her time was limited -Continue to cut portions and eat healthier and incorporating salads -Improved on submitting BP and weight reading on this week, eventhough time was limited  Barriers -continues to struggle with time management -limited exercise  Action Plan  - Increase exercise and steps -Replace tea and lemonade drink choices with more lemonade -Continue to work on time management to perform more BP and weight readings and submissions  Next f/u weekly update planned for 05/22/20

## 2020-05-25 NOTE — Telephone Encounter (Signed)
A f/u update r/t to remote monitoring hypertension program  was conducted with patient by text message call on today to remind patient that all BP readings didnot come through to the [place] [facility] as normal.  Pt was advised on program protocol regardig submitting BP  Also advised re-boot phone (turn off/back on) and check Bluetooth connection within settings section of phone  Pt state she understood and will attempt to manually input readings that are missing if she cannot get them to automatically be sent via wi-fi

## 2020-05-25 NOTE — Telephone Encounter (Addendum)
Documentation is r/t to conversation by text on 05/02/20 with patient   Pt contacted me by way of text message on this day to provide a weekly update regarding remote monitoring hypertension program  Pt states she had missed taking her BP meds 1 day this week (05/01/20) due to forgetting and being nervous about taking her phlebotomy exam test on that day.   Also states she has been inconsistent with BP management protocol (checking BP and weight and submitting readings) due preparing for the phlebotomy certification, family being in town, father having surgery which all ended up relating to time management issues, stress.    Action Plan  -Will plan to get back on track with measuring BP and weight and with program protocol beginning the following week

## 2020-05-25 NOTE — Telephone Encounter (Signed)
Conversation recorded on 05/22/20 with patient regarding weekly f/u update r/t to remote monitoring hypertension program    Successes _Increased water intake this past week (48oz) _Replaced dark sodas with light colored caffeine free sodas  -Continue to aware of food choices and sodium intake Fruit and vegatebles.  -Has re-arranged time to take BP readings and weight more as it relates to time management -Continues to read bible day as part of self care and stress reduction  Barriers -Time management -No exercising completed on this past week  Action Plan  - Increase exercise and utilize fitness app more  to count steps (daughter has been using phone more latel y than self)  -Find a way to relax more prior to submitting BP readings (63mins before)  -Continue to work on time management to perform more BP and weight readings and submissions  Next f/u weekly update planned for 05/25/20

## 2020-06-22 ENCOUNTER — Other Ambulatory Visit: Payer: Self-pay | Admitting: Physician Assistant

## 2020-06-22 NOTE — Congregational Nurse Program (Signed)
[name] follow up for remote patient monitoring for hypertension in collaboration with [facility]. Client is consistently monitoring. See notes of Lissa Hoard LPN for specific personal action plans and goals.  Providing today Last 7 day and last 30 day recorded averages for comparison for provider.  Last 7 day average blood pressure recorded in platform: 131/83 Highest last 7 day blood pressure recorded: 144/93 Lowest last 7 day blood pressure recorded: 113/75  Last 30 day average blood pressure recorded: 130/81 Last 30 day highest blood pressure recorded: 144/93 Last 30 day lowest blood pressure recorded : 113/75   Plan: RN will continue to monitor welch allyn dashboard daily during scheduled office hours per program guidelines to monitor for escalations and trends requiring intervention.  Lissa Hoard LPN will continue to offer support with client's personal action plans and goals and provide health coaching.  RN and Marnee Spring LPN will meet weekly to discuss client's progress and any potential barriers to meeting goals and develop possible solutions to any barriers.   Debria Garret RN

## 2020-06-26 ENCOUNTER — Telehealth: Payer: Self-pay

## 2020-06-26 NOTE — Telephone Encounter (Signed)
   Attempted to contact pt by phone and text on 06/14/20 but was unsuccessful   Left voicemail to return call

## 2020-06-26 NOTE — Telephone Encounter (Signed)
Attempted to contact patient but was unsucessful  A voice message and text message was left for patient to return phone call

## 2020-06-26 NOTE — Telephone Encounter (Signed)
Conversation recorded on 06/06/20 with patient regarding weekly f/u update r/t to remote monitoring hypertension program    Successes -Continue to monitor BP and weight check although times had to be adjusted -Continue to attempt to monitor eating habits (avoiding eating late at night, watching sodium intake, maintaining water intake, fruits and vegetables by way of salads) -Continues to have consistent movement, through work and outside leisure activity only -Maintained weight and have not gained  Barriers  -No exercising (I.e. walking)  due to time and not consistently using fitness app on phone to properly monitor steps  -Time management   Action Plan  - Increase exercise and utilize fitness app more  to count steps (daughter has been using phone more lately than self)  -Continue to work on time management to perform more BP and weight readings and submissions  Next f/u during week of  06/13/20

## 2020-06-28 ENCOUNTER — Telehealth: Payer: Self-pay

## 2020-07-03 NOTE — Congregational Nurse Program (Signed)
7 day averages for [name] remote patient monitoring for hypertension in collaboration with [facility].  Will provide last 7 day and 30 day averages for provider comparison.  Last 7 day averages based on recorded blood pressure in [employer].  Last 7 day average from recorded blood pressure readings: 131/83 Last 7 day highest blood pressure recorded: 138/86 Last 7 day lowest blood pressure recorded: 124/80  Last 30 day average blood pressure from recorded readings: 130/83 Last 30 day highest recorded blood pressure: 144/93 Last 30 day lowest recorded blood pressure: 113/75   Plan:  RN will continue to monitor daily recorded readings to dashboard during scheduled office hours per program guidelines to note any escalations or trends requiring intervention and or notification to [facility] provider.   LPN will continue to offer support and weekly health coaching.  RN and LPN will continue to meet weekly to address client's progress and identify and address any potential barriers to client meeting goals.   [place] [employer]

## 2020-07-04 NOTE — Congregational Nurse Program (Signed)
Pt attended clinic to provide a weekly update and BP and scale equipment techincal support and updates  Pt states she has not been taking her weight daily due to putting the scale away in her closet and then forgetting to take back out, to avoid letting her child tamper with the equipment...  Also she admits she does not want to remind herself about her weight until she gets more of the results that she really wants to see.   Pt was reminded of her SUCCESSES to help boost her self-esteem   SUCCESSES:   -continues to remain in the remote monitoring program regardless of the obstacles face and that she has 1 more month until reaching her 6 month participation goal and maintained her BP Goal or better of 135/85  -increases her knowledge on how to manage her BP and weight along with proper use of the the equipment  -changed her eating habits by challenging self to eat healthier, replacing sodas and increasing water intake to about 64 oz or more a day, watching sodium intake, watching portion sizes  -continuing to participate in a self care and mindfullness activity to lean upon when stressed (reading the bible)  -challenging self to utilize her "walking pounds away' video that can be used at home   Barriers -Time management and attempting to take care of and worrying about everyone else more so than self  -BP Equipment began having technical issues  -Self esteem issues regarding her weight   Action Plan to Assist with Barriers-   -Changed Batteries in equipment and perform-Re-inforce succesed a test check prior to patient leaving, which was successfully  ses to self and by way of health coach to  help deal with self-esteem  -Re-directed personal action plan to include the new program incentives to re-start gaining "NEW MOTIVATION" to help with stress, self-esteem regarding weight  -Advised patient since she has been stable within her BP goal.  She could now reduce her BP and Wt checks to  from  3 times a week to now 1 time week, which could  assist with her time management issue as it relates to BP and Wt checks  -Agrees to start on week of 07/10/20 performing new schedule for BP and wt check

## 2020-07-04 NOTE — Telephone Encounter (Signed)
F/U  with patient regardingweekly f/u update r/t to remote monitoring hypertension program was successfully completed on today  Successes -Attempts to continue to monitor BP and weight check although technical issues/Error messages have been noticed -Continue to attempt to monitor eating habits (avoiding eating late at night, watches portions sizes, watching sodium intake, maintaining water intake, fruits and vegetables by way of salads) -Continues to have consistent movement, through work and outside leisure activity only -Maintain weight  Barriers  -BP equipment technicalitites  -No time to exercise (I.e. walking)  due to time and not consistently using fitness app on phone to properly monitor steps as before -Time management overall (not having opportunity to attend to self, having to be caretaker for sick parent, homeschool child and take son to work)  Sports administrator -Meet with health coach with a in-person office visit on 07/04/20 to conduct next weekly meeting, perform equipment check on scale and BP  Increase exercise and to discuss succesess, barriers or challenges, to receive new program incentive items (water bottle and pedometer)  -Continue to work on time management  Next f/u will be held on 1012/21

## 2020-07-17 ENCOUNTER — Other Ambulatory Visit: Payer: Self-pay | Admitting: Physician Assistant

## 2020-07-17 DIAGNOSIS — E785 Hyperlipidemia, unspecified: Secondary | ICD-10-CM

## 2020-07-17 DIAGNOSIS — D649 Anemia, unspecified: Secondary | ICD-10-CM

## 2020-07-17 DIAGNOSIS — I10 Essential (primary) hypertension: Secondary | ICD-10-CM

## 2020-07-17 DIAGNOSIS — E119 Type 2 diabetes mellitus without complications: Secondary | ICD-10-CM

## 2020-07-20 ENCOUNTER — Telehealth: Payer: Self-pay

## 2020-07-20 NOTE — Telephone Encounter (Signed)
Pt states was busy at the time of call but will plan on returning phone call by end of the day or by next morning (07/21/20) by 12pm

## 2020-07-27 ENCOUNTER — Other Ambulatory Visit (HOSPITAL_COMMUNITY)
Admission: RE | Admit: 2020-07-27 | Discharge: 2020-07-27 | Disposition: A | Discharge status: Home / Self Care | Payer: No Typology Code available for payment source | Source: Ambulatory Visit | Attending: Physician Assistant | Admitting: Physician Assistant

## 2020-07-27 ENCOUNTER — Other Ambulatory Visit: Payer: Self-pay

## 2020-07-27 DIAGNOSIS — D649 Anemia, unspecified: Secondary | ICD-10-CM | POA: Insufficient documentation

## 2020-07-27 DIAGNOSIS — E119 Type 2 diabetes mellitus without complications: Secondary | ICD-10-CM | POA: Insufficient documentation

## 2020-07-27 DIAGNOSIS — I10 Essential (primary) hypertension: Secondary | ICD-10-CM | POA: Insufficient documentation

## 2020-07-27 DIAGNOSIS — E785 Hyperlipidemia, unspecified: Secondary | ICD-10-CM

## 2020-07-27 LAB — COMPREHENSIVE METABOLIC PANEL
ALT: 16 U/L (ref 0–44)
AST: 14 U/L — ABNORMAL LOW (ref 15–41)
Albumin: 3.9 g/dL (ref 3.5–5.0)
Alkaline Phosphatase: 54 U/L (ref 38–126)
Anion gap: 10 (ref 5–15)
BUN: 16 mg/dL (ref 6–20)
CO2: 28 mmol/L (ref 22–32)
Calcium: 9.4 mg/dL (ref 8.9–10.3)
Chloride: 100 mmol/L (ref 98–111)
Creatinine, Ser: 0.86 mg/dL (ref 0.44–1.00)
GFR, Estimated: >60 mL/min (ref >60)
Glucose, Bld: 138 mg/dL — ABNORMAL HIGH (ref 70–99)
Potassium: 3.9 mmol/L (ref 3.5–5.1)
Sodium: 138 mmol/L (ref 135–145)
Total Bilirubin: 0.8 mg/dL (ref 0.3–1.2)
Total Protein: 7.6 g/dL (ref 6.5–8.1)

## 2020-07-27 LAB — HEMOGLOBIN AND HEMATOCRIT, BLOOD
HCT: 36.2 % (ref 36.0–46.0)
Hemoglobin: 10.6 g/dL — ABNORMAL LOW (ref 12.0–15.0)

## 2020-07-27 LAB — LIPID PANEL
Cholesterol: 175 mg/dL (ref 0–200)
HDL: 36 mg/dL — ABNORMAL LOW (ref >40)
LDL Cholesterol: 109 mg/dL — ABNORMAL HIGH (ref 0–99)
Total CHOL/HDL Ratio: 4.9 RATIO
Triglycerides: 149 mg/dL (ref <150)
VLDL: 30 mg/dL (ref 0–40)

## 2020-07-28 LAB — HEMOGLOBIN A1C
Hgb A1c MFr Bld: 7.9 % — ABNORMAL HIGH (ref 4.8–5.6)
Mean Plasma Glucose: 180 mg/dL

## 2020-07-31 ENCOUNTER — Encounter: Payer: Self-pay | Admitting: Physician Assistant

## 2020-07-31 ENCOUNTER — Ambulatory Visit: Payer: Medicaid Other | Admitting: Physician Assistant

## 2020-07-31 VITALS — BP 152/88 | HR 94 | Temp 98.5°F | Ht 62.5 in | Wt 323.5 lb

## 2020-07-31 DIAGNOSIS — Z1239 Encounter for other screening for malignant neoplasm of breast: Secondary | ICD-10-CM

## 2020-07-31 DIAGNOSIS — I10 Essential (primary) hypertension: Secondary | ICD-10-CM

## 2020-07-31 DIAGNOSIS — Z5320 Procedure and treatment not carried out because of patient's decision for unspecified reasons: Secondary | ICD-10-CM

## 2020-07-31 DIAGNOSIS — R609 Edema, unspecified: Secondary | ICD-10-CM

## 2020-07-31 DIAGNOSIS — E1165 Type 2 diabetes mellitus with hyperglycemia: Secondary | ICD-10-CM

## 2020-07-31 DIAGNOSIS — Z6841 Body Mass Index (BMI) 40.0 and over, adult: Secondary | ICD-10-CM

## 2020-07-31 DIAGNOSIS — D649 Anemia, unspecified: Secondary | ICD-10-CM

## 2020-07-31 DIAGNOSIS — E785 Hyperlipidemia, unspecified: Secondary | ICD-10-CM

## 2020-07-31 MED ORDER — LOSARTAN POTASSIUM 100 MG PO TABS: 100.0000 mg | ORAL_TABLET | Freq: Every day | ORAL | 3 refills | Status: DC

## 2020-07-31 MED ORDER — GLIPIZIDE 5 MG PO TABS: 5.0000 mg | ORAL_TABLET | Freq: Two times a day (BID) | ORAL | 4 refills | Status: DC

## 2020-07-31 NOTE — Progress Notes (Signed)
BP (!) 152/88   Pulse 94   Temp 98.5 F (36.9 C)   Ht 5' 2.5" (1.588 m)   Wt (!) 323 lb 8 oz (146.7 kg)   SpO2 97%   BMI 58.23 kg/m    Subjective:    Patient ID: Amy Cardenas, female    DOB: 1970/09/27, 49 y.o.   MRN: [id number]  HPI: Amy R Valone is a 49 y.o. female presenting on 07/31/2020 for No chief complaint on file.   HPI   Pt had a negative covid 19 screening questionnaire.   Pt is 80yoF with routine follow up for DM, HTN, chol and anemia Pt stopped amlodipine due to LE swelling.  Pt is not working now.  She participates in htn monitoring program-  avg 131/83.  She says she has white coat syndrome  She is feeling fine today and her only concern today is swelling.      Relevant past medical, surgical, family and social history reviewed and updated as indicated. Interim medical history since our last visit reviewed. Allergies and medications reviewed and updated.    Current Outpatient Medications:  .  albuterol (PROVENTIL HFA;VENTOLIN HFA) 108 (90 Base) MCG/ACT inhaler, Inhale 1 puff into the lungs every 6 (six) hours as needed for wheezing or shortness of breath., Disp: , Rfl:  .  Ferrous Sulfate (IRON PO), Take by mouth., Disp: , Rfl:  .  glipiZIDE (GLUCOTROL) 5 MG tablet, Take 1 tablet (5 mg total) by mouth 2 (two) times daily before a meal., Disp: 60 tablet, Rfl: 4 .  losartan (COZAAR) 100 MG tablet, Take 1 tablet by mouth once daily, Disp: 30 tablet, Rfl: 3     Review of Systems  Per HPI unless specifically indicated above     Objective:    BP (!) 152/88   Pulse 94   Temp 98.5 F (36.9 C)   Ht 5' 2.5" (1.588 m)   Wt (!) 323 lb 8 oz (146.7 kg)   SpO2 97%   BMI 58.23 kg/m   Wt Readings from Last 3 Encounters:  07/31/20 (!) 323 lb 8 oz (146.7 kg)  01/31/20 (!) 328 lb (148.8 kg)  01/26/20 (!) 321 lb (145.6 kg)    Physical Exam Vitals reviewed.  Constitutional:      General: She is not in acute distress.    Appearance: She is  well-developed. She is obese. She is not ill-appearing.  HENT:     Head: Normocephalic and atraumatic.  Cardiovascular:     Rate and Rhythm: Normal rate and regular rhythm.  Pulmonary:     Effort: Pulmonary effort is normal.     Breath sounds: Normal breath sounds.  Abdominal:     General: Bowel sounds are normal.     Palpations: Abdomen is soft. There is no mass.     Tenderness: There is no abdominal tenderness.  Musculoskeletal:     Cervical back: Neck supple.     Right lower leg: Edema present.     Left lower leg: Edema present.     Comments: She fell asleep in chair last night so was sitting up with feet on floor all night  Lymphadenopathy:     Cervical: No cervical adenopathy.  Skin:    General: Skin is warm and dry.  Neurological:     Mental Status: She is alert and oriented to person, place, and time.  Psychiatric:        Behavior: Behavior normal.     Results for  orders placed or performed during the hospital encounter of 07/27/20  Hemoglobin and hematocrit, blood  Result Value Ref Range   Hemoglobin 10.6 (L) 12.0 - 15.0 g/dL   HCT 36.2 36 - 46 %  Hemoglobin A1c  Result Value Ref Range   Hgb A1c MFr Bld 7.9 (H) 4.8 - 5.6 %   Mean Plasma Glucose 180 mg/dL  Lipid panel  Result Value Ref Range   Cholesterol 175 0 - 200 mg/dL   Triglycerides 149 <150 mg/dL   HDL 36 (L) >40 mg/dL   Total CHOL/HDL Ratio 4.9 RATIO   VLDL 30 0 - 40 mg/dL   LDL Cholesterol 109 (H) 0 - 99 mg/dL  Comprehensive metabolic panel  Result Value Ref Range   Sodium 138 135 - 145 mmol/L   Potassium 3.9 3.5 - 5.1 mmol/L   Chloride 100 98 - 111 mmol/L   CO2 28 22 - 32 mmol/L   Glucose, Bld 138 (H) 70 - 99 mg/dL   BUN 16 6 - 20 mg/dL   Creatinine, Ser 0.86 0.44 - 1.00 mg/dL   Calcium 9.4 8.9 - 10.3 mg/dL   Total Protein 7.6 6.5 - 8.1 g/dL   Albumin 3.9 3.5 - 5.0 g/dL   AST 14 (L) 15 - 41 U/L   ALT 16 0 - 44 U/L   Alkaline Phosphatase 54 38 - 126 U/L   Total Bilirubin 0.8 0.3 - 1.2 mg/dL    GFR, Estimated >60 >60 mL/min   Anion gap 10 5 - 15      Assessment & Plan:     Encounter Diagnoses  Name Primary?  [name] Uncontrolled type 2 diabetes mellitus with hyperglycemia ([place]) Yes  . Essential hypertension   . Hyperlipidemia, unspecified hyperlipidemia type   . Anemia, unspecified type   . Morbid obesity ([place])   . BMI 50.0-59.9, adult ([place])   . Statin declined   . Encounter for screening for malignant neoplasm of breast, unspecified screening modality   . Edema, unspecified type       -reviewed labs with pt   -Pt doesn't want to Increase glipizide to 10g bid as recomended.  She want to try to not miss her doses.  -discussed with pt recomendations for all diabetics to be on a statin.  Pt declines statin  -discussed with pt that her increasing weight is contributing to most of her medical conditions including the uncontrolled DM, her HTN, and the LE edema.  Encouraged healthy diet and regular exercise.  Discussed that even a modest weight loss can have been improvements in conditions.  -encouraged pt to elevate feet when seated to improve swelling.  Also regular exercise like walking and weight loss will help  -pt to continue with HTN monitoring program.  She is to notify office if bp goes up (over 140)  -will refer for screening Mammogram  -Pt inquires about updating PAP.  Discussed taht she has family planning medicaid so can get that at local gynecologist.  She will need to call to schedule herself. /fu 3 mo  -pt to continue iron for her anemia  -pt to follow up 3 months.  She is to contact office sooner prn

## 2020-08-02 ENCOUNTER — Other Ambulatory Visit: Payer: Self-pay | Admitting: Student

## 2020-08-02 DIAGNOSIS — Z1239 Encounter for other screening for malignant neoplasm of breast: Secondary | ICD-10-CM

## 2020-09-07 ENCOUNTER — Ambulatory Visit (HOSPITAL_COMMUNITY)
Admission: RE | Admit: 2020-09-07 | Discharge: 2020-09-07 | Disposition: A | Discharge status: Home / Self Care | Payer: Self-pay | Source: Ambulatory Visit | Attending: Physician Assistant | Admitting: Physician Assistant

## 2020-09-07 ENCOUNTER — Other Ambulatory Visit: Payer: Self-pay

## 2020-09-07 DIAGNOSIS — Z1239 Encounter for other screening for malignant neoplasm of breast: Secondary | ICD-10-CM | POA: Insufficient documentation

## 2020-10-17 ENCOUNTER — Other Ambulatory Visit: Payer: Self-pay | Admitting: Physician Assistant

## 2020-10-17 DIAGNOSIS — I10 Essential (primary) hypertension: Secondary | ICD-10-CM

## 2020-10-17 DIAGNOSIS — E1165 Type 2 diabetes mellitus with hyperglycemia: Secondary | ICD-10-CM

## 2020-10-17 DIAGNOSIS — E785 Hyperlipidemia, unspecified: Secondary | ICD-10-CM

## 2020-10-17 DIAGNOSIS — D649 Anemia, unspecified: Secondary | ICD-10-CM

## 2020-10-30 ENCOUNTER — Ambulatory Visit: Payer: Medicaid Other | Admitting: Physician Assistant

## 2020-12-08 ENCOUNTER — Other Ambulatory Visit: Payer: Self-pay | Admitting: Physician Assistant

## 2020-12-14 ENCOUNTER — Other Ambulatory Visit: Payer: Self-pay | Admitting: Physician Assistant

## 2021-01-15 NOTE — Congregational Nurse Program (Signed)
[name] averages for client for last 60 days = 30 readings  Client continues follow up with health coaching per plan with Lissa Hoard LPN.   063/01 average blood pressure average per dashboard 151/93 highest reading recorded over last 60d. 106/71 lowest reading recorded.  Debria Garret RN [employer]

## 2021-03-07 ENCOUNTER — Other Ambulatory Visit: Payer: Self-pay | Admitting: Physician Assistant

## 2021-08-02 ENCOUNTER — Other Ambulatory Visit (HOSPITAL_COMMUNITY): Payer: Self-pay | Admitting: Physician Assistant

## 2021-08-02 DIAGNOSIS — Z1231 Encounter for screening mammogram for malignant neoplasm of breast: Secondary | ICD-10-CM

## 2021-09-10 ENCOUNTER — Ambulatory Visit (HOSPITAL_COMMUNITY)
Admission: RE | Admit: 2021-09-10 | Discharge: 2021-09-10 | Disposition: A | Discharge status: Home / Self Care | Payer: 59 | Source: Ambulatory Visit | Attending: Physician Assistant | Admitting: Physician Assistant

## 2021-09-10 ENCOUNTER — Other Ambulatory Visit: Payer: Self-pay

## 2021-09-10 DIAGNOSIS — Z1231 Encounter for screening mammogram for malignant neoplasm of breast: Secondary | ICD-10-CM | POA: Diagnosis present

## 2022-02-20 DIAGNOSIS — Z6841 Body Mass Index (BMI) 40.0 and over, adult: Secondary | ICD-10-CM | POA: Diagnosis not present

## 2022-02-20 DIAGNOSIS — E785 Hyperlipidemia, unspecified: Secondary | ICD-10-CM | POA: Diagnosis not present

## 2022-02-20 DIAGNOSIS — E119 Type 2 diabetes mellitus without complications: Secondary | ICD-10-CM | POA: Diagnosis not present

## 2022-02-20 DIAGNOSIS — I10 Essential (primary) hypertension: Secondary | ICD-10-CM | POA: Diagnosis not present

## 2022-02-28 DIAGNOSIS — I10 Essential (primary) hypertension: Secondary | ICD-10-CM | POA: Diagnosis not present

## 2022-02-28 DIAGNOSIS — G4733 Obstructive sleep apnea (adult) (pediatric): Secondary | ICD-10-CM | POA: Diagnosis not present

## 2022-02-28 DIAGNOSIS — R5382 Chronic fatigue, unspecified: Secondary | ICD-10-CM | POA: Diagnosis not present

## 2022-03-14 DIAGNOSIS — G4730 Sleep apnea, unspecified: Secondary | ICD-10-CM | POA: Diagnosis not present

## 2022-06-27 DIAGNOSIS — G4730 Sleep apnea, unspecified: Secondary | ICD-10-CM | POA: Diagnosis not present

## 2022-06-27 DIAGNOSIS — E119 Type 2 diabetes mellitus without complications: Secondary | ICD-10-CM | POA: Diagnosis not present

## 2022-06-27 DIAGNOSIS — Z20822 Contact with and (suspected) exposure to covid-19: Secondary | ICD-10-CM | POA: Diagnosis not present

## 2022-06-27 DIAGNOSIS — E785 Hyperlipidemia, unspecified: Secondary | ICD-10-CM | POA: Diagnosis not present

## 2022-06-27 DIAGNOSIS — J019 Acute sinusitis, unspecified: Secondary | ICD-10-CM | POA: Diagnosis not present

## 2022-07-29 DIAGNOSIS — G4733 Obstructive sleep apnea (adult) (pediatric): Secondary | ICD-10-CM | POA: Diagnosis not present

## 2022-08-27 DIAGNOSIS — G4733 Obstructive sleep apnea (adult) (pediatric): Secondary | ICD-10-CM | POA: Diagnosis not present

## 2022-08-28 DIAGNOSIS — G4733 Obstructive sleep apnea (adult) (pediatric): Secondary | ICD-10-CM | POA: Diagnosis not present

## 2022-09-27 DIAGNOSIS — G4733 Obstructive sleep apnea (adult) (pediatric): Secondary | ICD-10-CM | POA: Diagnosis not present

## 2022-09-28 DIAGNOSIS — G4733 Obstructive sleep apnea (adult) (pediatric): Secondary | ICD-10-CM | POA: Diagnosis not present

## 2022-10-22 ENCOUNTER — Ambulatory Visit (INDEPENDENT_AMBULATORY_CARE_PROVIDER_SITE_OTHER): Payer: BC Managed Care – PPO | Admitting: Family Medicine

## 2022-10-22 ENCOUNTER — Encounter (INDEPENDENT_AMBULATORY_CARE_PROVIDER_SITE_OTHER): Payer: Self-pay | Admitting: Family Medicine

## 2022-10-22 VITALS — BP 174/98 | HR 75 | Temp 98.0°F | Ht 62.0 in | Wt 314.0 lb

## 2022-10-22 DIAGNOSIS — I10 Essential (primary) hypertension: Secondary | ICD-10-CM

## 2022-10-22 DIAGNOSIS — E1169 Type 2 diabetes mellitus with other specified complication: Secondary | ICD-10-CM | POA: Diagnosis not present

## 2022-10-22 DIAGNOSIS — Z6841 Body Mass Index (BMI) 40.0 and over, adult: Secondary | ICD-10-CM

## 2022-10-22 DIAGNOSIS — E7849 Other hyperlipidemia: Secondary | ICD-10-CM | POA: Diagnosis not present

## 2022-10-22 DIAGNOSIS — E669 Obesity, unspecified: Secondary | ICD-10-CM

## 2022-10-22 DIAGNOSIS — Z0289 Encounter for other administrative examinations: Secondary | ICD-10-CM

## 2022-10-22 DIAGNOSIS — Z7984 Long term (current) use of oral hypoglycemic drugs: Secondary | ICD-10-CM

## 2022-10-28 DIAGNOSIS — G4733 Obstructive sleep apnea (adult) (pediatric): Secondary | ICD-10-CM | POA: Diagnosis not present

## 2022-10-29 DIAGNOSIS — G4733 Obstructive sleep apnea (adult) (pediatric): Secondary | ICD-10-CM | POA: Diagnosis not present

## 2022-11-05 NOTE — Progress Notes (Signed)
Office: [phone]  /  Fax: [phone]   Initial Visit  Amy Cardenas was seen in clinic today to evaluate for obesity. She is interested in losing weight to improve overall health and reduce the risk of weight related complications. She presents today to review program treatment options, initial physical assessment, and evaluation.     She was referred by: Self-Referral  When asked what else they would like to accomplish? She states: Other: 170 pounds-goal. Weight has slowly increased over time (02/2006). When asked how has your weight affected you? She states: Contributed to orthopedic problems or mobility issues, Having fatigue, Having poor endurance, and Problems with depression and or anxiety  Some associated conditions: Hypertension, Hyperlipidemia, Diabetes, and Other: [place], emotional eating  Contributing factors: Life event, Pregnancy, and Other: The baby at 67, had gestational diabetes.  Weight promoting medications identified: Other: Glipizide  Current nutrition plan: None and Other: Lactose free diet  Current level of physical activity: None and NE[employer] Sedentary job at hospice.  52 year old son at home, 54 year old daughter.  Current or previous pharmacotherapy: Phentermine  Response to medication: Lost weight and was able to maintain weight loss   Past medical history includes:   Past Medical History:  Diagnosis Date   Abnormal mammogram of right breast 08/04/2018   Anemia    Anxiety    Diabetes mellitus without complication ([place]) [id number]   Hypertension    Obesity    Umbilical hernia      Objective:   BP (!) 174/98   Pulse 75   Temp 98 F (36.7 C)   Ht 5' 2"$  (1.575 m)   Wt (!) 314 lb (142.4 kg)   SpO2 99%   BMI 57.43 kg/m  She was weighed on the bioimpedance scale: Body mass index is 57.43 kg/m.   Peak Weight: 314 lbs Current weight: 314 lbs Visceral Fat Rating:25 Body Fat%: 59.6   General:  Alert, oriented and cooperative. Patient is in  no acute distress.  Respiratory: Normal respiratory effort, no problems with respiration noted  Extremities: Normal range of motion.    Mental Status: Normal mood and affect. Normal behavior. Normal judgment and thought content.   Assessment and Plan:  Assessment: 1. Type 2 diabetes mellitus with other specified complication, without long-term current use of insulin (HCC) Last A1c 7.9 on 07/27/2022. Taking glipizide 10 mg twice daily-which can cause weight gain. Poor diet and lack of exercise.  2. Essential hypertension Blood pressure elevated today. Taking atenolol 100 mg daily. Denies headache or chest pain.  3. Other hyperlipidemia Last LDL 109 with HDL 36 on 07/27/2022. Refused statin therapy.  Plan: 1. Type 2 diabetes mellitus with other specified complication, without long-term current use of insulin (HCC) Reduce intake of sugar and consider metformin plus GLP agonist.  2. Essential hypertension Repeat blood pressure next visit.  Encouraged home blood pressure monitoring.  3. Other hyperlipidemia Looking for cholesterol improvements with lifestyle changes.  4. Obesity, current BMI 57.5 1.  Reviewed bioimpedance results. 2.  Reviewed patient's expectations.   We reviewed weight, biometrics, associated medical conditions and contributing factors with patient. She would benefit from weight loss therapy via a modified calorie, low-carb, high-protein nutritional plan tailored to their REE (resting energy expenditure) which will be determined by indirect calorimetry.  We will also assess for cardiometabolic risk and nutritional derangements via fasting serologies at her next appointment.   Patient will work on garnering support from family and friends to begin weight loss journey. Will work on eliminating  or reducing the presence of highly palatable, calorie dense foods in the home. Will complete provided nutritional and psychosocial assessment questionnaire before the next  appointment. Will be scheduled for indirect calorimetry to determine resting energy expenditure in a fasting state.  This will allow [place] to create a reduced calorie, high-protein meal plan to promote loss of fat mass while preserving muscle mass. Was counseled on nutritional approaches to weight loss and benefits of complex carbs and high quality protein as part of nutritional weight management. Was counseled on pharmacotherapy and role as an adjunct in weight management.   Obesity Education Performed Today:  She was weighed on the bioimpedance scale and results were discussed and documented in the synopsis.  We discussed obesity as a disease and the importance of a more detailed evaluation of all the factors contributing to the disease.  We discussed the importance of long term lifestyle changes which include nutrition, exercise and behavioral modifications as well as the importance of customizing this to her specific health and social needs.  We discussed the benefits of reaching a healthier weight to alleviate the symptoms of existing conditions and reduce the risks of the biomechanical, metabolic and psychological effects of obesity.  Amy R Dehnert appears to be in the action stage of change and states they are ready to start intensive lifestyle modifications and behavioral modifications.  Follow up in 3 weeks.  30 minutes was spent today on this visit including the above counseling, pre-visit chart review, and post-visit documentation.  Reviewed by clinician on day of visit: allergies, medications, problem list, medical history, surgical history, family history, social history, and previous encounter notes.   I, Georgianne Fick, FNP, am acting as transcriptionist for Dr. Loyal Gambler.   I have reviewed the above documentation for accuracy and completeness, and I agree with the above. Dell Ponto, DO

## 2022-11-15 DIAGNOSIS — Z1321 Encounter for screening for nutritional disorder: Secondary | ICD-10-CM | POA: Diagnosis not present

## 2022-11-15 DIAGNOSIS — Z0000 Encounter for general adult medical examination without abnormal findings: Secondary | ICD-10-CM | POA: Diagnosis not present

## 2022-11-15 DIAGNOSIS — Z13220 Encounter for screening for lipoid disorders: Secondary | ICD-10-CM | POA: Diagnosis not present

## 2022-11-15 DIAGNOSIS — Z124 Encounter for screening for malignant neoplasm of cervix: Secondary | ICD-10-CM | POA: Diagnosis not present

## 2022-11-15 DIAGNOSIS — Z1329 Encounter for screening for other suspected endocrine disorder: Secondary | ICD-10-CM | POA: Diagnosis not present

## 2022-11-15 DIAGNOSIS — Z6841 Body Mass Index (BMI) 40.0 and over, adult: Secondary | ICD-10-CM | POA: Diagnosis not present

## 2022-11-15 DIAGNOSIS — Z01419 Encounter for gynecological examination (general) (routine) without abnormal findings: Secondary | ICD-10-CM | POA: Diagnosis not present

## 2022-11-15 DIAGNOSIS — N914 Secondary oligomenorrhea: Secondary | ICD-10-CM | POA: Diagnosis not present

## 2022-11-15 DIAGNOSIS — Z130 Encounter for screening for diseases of the blood and blood-forming organs and certain disorders involving the immune mechanism: Secondary | ICD-10-CM | POA: Diagnosis not present

## 2022-11-15 DIAGNOSIS — Z131 Encounter for screening for diabetes mellitus: Secondary | ICD-10-CM | POA: Diagnosis not present

## 2022-11-18 ENCOUNTER — Ambulatory Visit (INDEPENDENT_AMBULATORY_CARE_PROVIDER_SITE_OTHER): Payer: BC Managed Care – PPO | Admitting: Family Medicine

## 2022-11-18 ENCOUNTER — Encounter (INDEPENDENT_AMBULATORY_CARE_PROVIDER_SITE_OTHER): Payer: Self-pay | Admitting: Family Medicine

## 2022-11-18 VITALS — BP 174/114 | HR 68 | Temp 98.1°F | Ht 62.0 in | Wt 315.0 lb

## 2022-11-18 DIAGNOSIS — F3289 Other specified depressive episodes: Secondary | ICD-10-CM

## 2022-11-18 DIAGNOSIS — Z6841 Body Mass Index (BMI) 40.0 and over, adult: Secondary | ICD-10-CM

## 2022-11-18 DIAGNOSIS — F32A Depression, unspecified: Secondary | ICD-10-CM | POA: Insufficient documentation

## 2022-11-18 DIAGNOSIS — R5383 Other fatigue: Secondary | ICD-10-CM

## 2022-11-18 DIAGNOSIS — G4733 Obstructive sleep apnea (adult) (pediatric): Secondary | ICD-10-CM | POA: Diagnosis not present

## 2022-11-18 DIAGNOSIS — Z7984 Long term (current) use of oral hypoglycemic drugs: Secondary | ICD-10-CM

## 2022-11-18 DIAGNOSIS — I10 Essential (primary) hypertension: Secondary | ICD-10-CM | POA: Diagnosis not present

## 2022-11-18 DIAGNOSIS — Z1331 Encounter for screening for depression: Secondary | ICD-10-CM

## 2022-11-18 DIAGNOSIS — R0602 Shortness of breath: Secondary | ICD-10-CM | POA: Diagnosis not present

## 2022-11-18 DIAGNOSIS — E1169 Type 2 diabetes mellitus with other specified complication: Secondary | ICD-10-CM

## 2022-11-18 DIAGNOSIS — E119 Type 2 diabetes mellitus without complications: Secondary | ICD-10-CM | POA: Insufficient documentation

## 2022-11-18 MED ORDER — CHLORTHALIDONE 25 MG PO TABS: 25.0000 mg | ORAL_TABLET | Freq: Every day | ORAL | 0 refills | Status: DC

## 2022-11-19 LAB — COMPREHENSIVE METABOLIC PANEL
ALT: 12 IU/L (ref 0–32)
AST: 9 IU/L (ref 0–40)
Albumin/Globulin Ratio: 1.4 (ref 1.2–2.2)
Albumin: 3.9 g/dL (ref 3.8–4.9)
Alkaline Phosphatase: 73 IU/L (ref 44–121)
BUN/Creatinine Ratio: 11 (ref 9–23)
BUN: 10 mg/dL (ref 6–24)
Bilirubin Total: 0.7 mg/dL (ref 0.0–1.2)
CO2: 25 mmol/L (ref 20–29)
Calcium: 9.2 mg/dL (ref 8.7–10.2)
Chloride: 99 mmol/L (ref 96–106)
Creatinine, Ser: 0.9 mg/dL (ref 0.57–1.00)
Globulin, Total: 2.8 g/dL (ref 1.5–4.5)
Glucose: 201 mg/dL — ABNORMAL HIGH (ref 70–99)
Potassium: 4.4 mmol/L (ref 3.5–5.2)
Sodium: 135 mmol/L (ref 134–144)
Total Protein: 6.7 g/dL (ref 6.0–8.5)
eGFR: 77 mL/min/{1.73_m2} (ref >59)

## 2022-11-19 LAB — HEMOGLOBIN A1C
Est. average glucose Bld gHb Est-mCnc: 237 mg/dL
Hgb A1c MFr Bld: 9.9 % — ABNORMAL HIGH (ref 4.8–5.6)

## 2022-11-19 LAB — CBC WITH DIFFERENTIAL/PLATELET
Basophils Absolute: 0.1 10*3/uL (ref 0.0–0.2)
Basos: 1 %
EOS (ABSOLUTE): 0.2 10*3/uL (ref 0.0–0.4)
Eos: 3 %
Hematocrit: 35.3 % (ref 34.0–46.6)
Hemoglobin: 10.7 g/dL — ABNORMAL LOW (ref 11.1–15.9)
Immature Grans (Abs): 0 10*3/uL (ref 0.0–0.1)
Immature Granulocytes: 1 %
Lymphocytes Absolute: 2 10*3/uL (ref 0.7–3.1)
Lymphs: 29 %
MCH: 21.7 pg — ABNORMAL LOW (ref 26.6–33.0)
MCHC: 30.3 g/dL — ABNORMAL LOW (ref 31.5–35.7)
MCV: 72 fL — ABNORMAL LOW (ref 79–97)
Monocytes Absolute: 0.4 10*3/uL (ref 0.1–0.9)
Monocytes: 6 %
Neutrophils Absolute: 4.1 10*3/uL (ref 1.4–7.0)
Neutrophils: 60 %
Platelets: 354 10*3/uL (ref 150–450)
RBC: 4.92 x10E6/uL (ref 3.77–5.28)
RDW: 16 % — ABNORMAL HIGH (ref 11.7–15.4)
WBC: 6.7 10*3/uL (ref 3.4–10.8)

## 2022-11-19 LAB — LIPID PANEL
Chol/HDL Ratio: 6.2 ratio — ABNORMAL HIGH (ref 0.0–4.4)
Cholesterol, Total: 192 mg/dL (ref 100–199)
HDL: 31 mg/dL — ABNORMAL LOW (ref >39)
LDL Chol Calc (NIH): 117 mg/dL — ABNORMAL HIGH (ref 0–99)
Triglycerides: 248 mg/dL — ABNORMAL HIGH (ref 0–149)
VLDL Cholesterol Cal: 44 mg/dL — ABNORMAL HIGH (ref 5–40)

## 2022-11-19 LAB — FOLATE: Folate: 12.2 ng/mL (ref >3.0)

## 2022-11-19 LAB — VITAMIN B12: Vitamin B-12: 543 pg/mL (ref 232–1245)

## 2022-11-19 LAB — INSULIN, RANDOM: INSULIN: 13.4 u[IU]/mL (ref 2.6–24.9)

## 2022-11-19 LAB — VITAMIN D 25 HYDROXY (VIT D DEFICIENCY, FRACTURES): Vit D, 25-Hydroxy: 30 ng/mL (ref 30.0–100.0)

## 2022-11-19 LAB — T4, FREE: Free T4: 1.26 ng/dL (ref 0.82–1.77)

## 2022-11-19 LAB — TSH: TSH: 2.4 u[IU]/mL (ref 0.450–4.500)

## 2022-11-27 DIAGNOSIS — G4733 Obstructive sleep apnea (adult) (pediatric): Secondary | ICD-10-CM | POA: Diagnosis not present

## 2022-11-27 NOTE — Progress Notes (Signed)
Chief Complaint:   OBESITY Amy Cardenas (MR# [id number]) is a 52 y.o. female who presents for evaluation and treatment of obesity and related comorbidities. Current BMI is Body mass index is 57.61 kg/m. Amy has been struggling with her weight for many years and has been unsuccessful in either losing weight, maintaining weight loss, or reaching her healthy weight goal.  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  She lives with her 2 children.  Eating out 5 times per week.  Craves soda, fries, sweets.  She struggles with meal planning.  Her 68 year old daughter is picky.  She has a long history of emotional, boredom eating.  Amy is currently in the action stage of change and ready to dedicate time achieving and maintaining a healthier weight. Amy is interested in becoming our patient and working on intensive lifestyle modifications including (but not limited to) diet and exercise for weight loss.  Amy Cardenas's habits were reviewed today and are as follows: Her family eats meals together, she thinks her family will eat healthier with her, she struggles with family and or coworkers weight loss sabotage, her desired weight loss is 135 lbs, she started gaining weight at age 61, her heaviest weight ever was 315 pounds, she has significant food cravings issues, she snacks frequently in the evenings, she skips meals frequently, she is frequently drinking liquids with calories, she frequently makes poor food choices, she has problems with excessive hunger, she frequently eats larger portions than normal, and she struggles with emotional eating.  Depression Screen Amy Cardenas (modified PHQ-9) score was 22.  Subjective:   1. Other fatigue Amy admits to daytime somnolence and admits to waking up still tired. Patient has a history of symptoms of daytime fatigue and morning fatigue. Amy generally gets 7 hours of sleep per night, and states that she has nightime  awakenings and generally restful sleep. Snoring is present. Apneic episodes are not present. Epworth Sleepiness Score is 8.  EKG NSR without ischemic changes.  2. SOBOE (shortness of breath on exertion) Amy notes increasing shortness of breath with exercising and seems to be worsening over time with weight gain. She notes getting out of breath sooner with activity than she used to. This has not gotten worse recently. Amy denies shortness of breath at rest or orthopnea.  3. Type 2 diabetes mellitus with other specified complication, without long-term current use of insulin (HCC) Last A1c was 7.9 on 07/27/2022.  Patient stopped metformin in the past, no side effects.  Patient is on glipizide 10 mg twice daily.  She has refused statin therapy.  4. Essential hypertension Blood pressure elevated on atenolol 100 mg daily.  Previously had ACE cough and leg edema from amlodipine.  Previously had lip swelling on losartan.  5. OSA on CPAP Reports good compliance with CPAP with mask comes off at night.  6. Other depression Bariatric PHQ-9:22.  Patient has a long history of emotional eating ever since having her daughter.  She is not taking anything for Cardenas.  Assessment/Plan:   1. Other fatigue Amy does feel that her weight is causing her energy to be lower than it should be. Fatigue may be related to obesity, depression or many other causes. Labs will be ordered, and in the meanwhile, Amy will focus on self care including making healthy food choices, increasing physical activity and focusing on stress reduction.  Update labs today.  - EKG 12-Lead - VITAMIN D 25 Hydroxy (Vit-D  Deficiency, Fractures) - TSH - T4, free - Lipid panel - Insulin, random - Hemoglobin A1c - Folate - Comprehensive metabolic panel - Vitamin 123456 - CBC with Differential/Platelet  2. SOBOE (shortness of breath on exertion) Amy does feel that she gets out of breath more easily that she used to when she exercises.  Amy Cardenas's shortness of breath appears to be obesity related and exercise induced. She has agreed to work on weight loss and gradually increase exercise to treat her exercise induced shortness of breath. Will continue to monitor closely.  3. Type 2 diabetes mellitus with other specified complication, without long-term current use of insulin ([place]) Consider adding GLP-1 agonist.  Begin prescribed diet.  Recheck A1c today and fasting insulin.  - Insulin, random - Hemoglobin A1c  4. Essential hypertension 1.  Add chlorthalidone 25 mg daily #30 no refills.,  Discontinued once finding sulfa allergy in the chart. 2.  Reduce high sodium restaurant foods. 3.  Check labs today  - Comprehensive metabolic panel  5. OSA on CPAP Begin active plan for weight loss.  Aim for 7 to 8 hours of sleep with CPAP.  6. Other depression Begin active plan for weight loss.  Aim for 7 to 8 hours of sleep with CPAP.  7. Depression screen Amy had a positive depression screening. Depression is commonly associated with obesity and often results in emotional eating behaviors. We will monitor this closely and work on CBT to help improve the non-hunger eating patterns. Referral to Psychology may be required if no improvement is seen as she continues in our clinic.  8. Morbid obesity ([place])  9. Obesity,current BMI 57.6 200 calorie snack list given Cut out SSB's. Reduce meals out.   Amy is currently in the action stage of change and her goal is to continue with weight loss efforts. I recommend Amy begin the structured treatment plan as follows:  She has agreed to the Category 3 Plan.  Exercise goals: Checking out walking options indoor or outdoor.   Behavioral modification strategies: increasing lean protein intake, increasing vegetables, increasing water intake, decreasing liquid calories, decreasing eating out, no skipping meals, meal planning and cooking strategies, keeping healthy foods in the home, better  snacking choices, avoiding temptations, planning for success, and decreasing junk food.  She was informed of the importance of frequent follow-up visits to maximize her success with intensive lifestyle modifications for her multiple health conditions. She was informed we would discuss her lab results at her next visit unless there is a critical issue that needs to be addressed sooner. Amy agreed to keep her next visit at the agreed upon time to discuss these results.  Objective:   Blood pressure (!) 174/114, pulse 68, temperature 98.1 F (36.7 C), height '5\' 2"'$  (1.575 m), weight (!) 315 lb (142.9 kg), SpO2 100 %. Body mass index is 57.61 kg/m.  EKG: Normal sinus rhythm, rate 67 bpm.  Indirect Calorimeter completed today shows a VO2 of 333 and a REE of 2304.  Her calculated basal metabolic rate is [id number] thus her basal metabolic rate is better than expected.  General: Cooperative, alert, well developed, in no acute distress. HEENT: Conjunctivae and lids unremarkable. Cardiovascular: Regular rhythm.  Lungs: Normal work of breathing. Neurologic: No focal deficits.   Lab Results  Component Value Date   CREATININE 0.90 11/18/2022   BUN 10 11/18/2022   NA 135 11/18/2022   K 4.4 11/18/2022   CL 99 11/18/2022   CO2 25 11/18/2022   Lab Results  Component Value Date   ALT 12 11/18/2022   AST 9 11/18/2022   ALKPHOS 73 11/18/2022   BILITOT 0.7 11/18/2022   Lab Results  Component Value Date   HGBA1C 9.9 (H) 11/18/2022   HGBA1C 7.9 (H) 07/27/2020   HGBA1C 7.5 (H) 05/01/2020   HGBA1C 7.7 (H) 01/24/2020   HGBA1C 7.1 (H) 10/11/2019   Lab Results  Component Value Date   INSULIN 13.4 11/18/2022   Lab Results  Component Value Date   TSH 2.400 11/18/2022   Lab Results  Component Value Date   CHOL 192 11/18/2022   HDL 31 (L) 11/18/2022   LDLCALC 117 (H) 11/18/2022   TRIG 248 (H) 11/18/2022   CHOLHDL 6.2 (H) 11/18/2022   Lab Results  Component Value Date   WBC 6.7 11/18/2022    HGB 10.7 (L) 11/18/2022   HCT 35.3 11/18/2022   MCV 72 (L) 11/18/2022   PLT 354 11/18/2022   No results found for: "IRON", "TIBC", "FERRITIN"  Attestation Statements:   Reviewed by clinician on day of visit: allergies, medications, problem list, medical history, surgical history, family history, social history, and previous encounter notes.  Time spent on visit including pre-visit chart review and post-visit charting and care was 45 minutes.   I, Davy Pique, am acting as Location manager for Loyal Gambler, DO.  I have reviewed the above documentation for accuracy and completeness, and I agree with the above. Dell Ponto, DO

## 2022-12-02 ENCOUNTER — Encounter (INDEPENDENT_AMBULATORY_CARE_PROVIDER_SITE_OTHER): Payer: Self-pay | Admitting: Family Medicine

## 2022-12-02 ENCOUNTER — Ambulatory Visit (INDEPENDENT_AMBULATORY_CARE_PROVIDER_SITE_OTHER): Payer: BC Managed Care – PPO | Admitting: Family Medicine

## 2022-12-02 VITALS — BP 163/101 | HR 72 | Temp 98.8°F | Ht 62.0 in | Wt 308.0 lb

## 2022-12-02 DIAGNOSIS — I1A0 Resistant hypertension: Secondary | ICD-10-CM

## 2022-12-02 DIAGNOSIS — Z7985 Long-term (current) use of injectable non-insulin antidiabetic drugs: Secondary | ICD-10-CM

## 2022-12-02 DIAGNOSIS — D500 Iron deficiency anemia secondary to blood loss (chronic): Secondary | ICD-10-CM | POA: Diagnosis not present

## 2022-12-02 DIAGNOSIS — Z6841 Body Mass Index (BMI) 40.0 and over, adult: Secondary | ICD-10-CM

## 2022-12-02 DIAGNOSIS — E119 Type 2 diabetes mellitus without complications: Secondary | ICD-10-CM | POA: Diagnosis not present

## 2022-12-02 DIAGNOSIS — I10 Essential (primary) hypertension: Secondary | ICD-10-CM | POA: Insufficient documentation

## 2022-12-02 DIAGNOSIS — D509 Iron deficiency anemia, unspecified: Secondary | ICD-10-CM | POA: Insufficient documentation

## 2022-12-02 DIAGNOSIS — E559 Vitamin D deficiency, unspecified: Secondary | ICD-10-CM | POA: Diagnosis not present

## 2022-12-02 MED ORDER — TIRZEPATIDE 2.5 MG/0.5ML ~~LOC~~ SOAJ: 2.5000 mg | SUBCUTANEOUS | 0 refills | Status: DC

## 2022-12-02 MED ORDER — VITAMIN D (ERGOCALCIFEROL) 1.25 MG (50000 UNIT) PO CAPS: 50000.0000 [IU] | ORAL_CAPSULE | ORAL | 0 refills | Status: DC

## 2022-12-02 NOTE — Assessment & Plan Note (Signed)
Last vitamin D Lab Results  Component Value Date   VD25OH 30.0 11/18/2022   Reviewed lab results from last visit including vitamin D level at 47.  She does complain of fatigue.  We discussed consequences of vitamin D deficiency including poor immune system, osteoporosis and low energy.  Begin prescription vitamin D 50,000 IU once weekly.  Recheck level in 3 to 4 months.

## 2022-12-02 NOTE — Assessment & Plan Note (Signed)
Lab Results  Component Value Date   HGBA1C 9.9 (H) 11/18/2022   Reviewed labs from 11/18/2022 with patient.  Diabetes is poorly controlled on glipizide 10 mg twice daily.  She has previously tried metformin but discontinued due to fear of risk of continuing.  She denies issues with hypoglycemia.  She has started reducing her intake of sugar and consuming her prescribed dietary plan.  Continue active plan for weight reduction, introducing more regular exercise.  Begin Mounjaro 2.5 mg once weekly injection for type 2 diabetes with the added benefit of weight reduction.  We reviewed mechanism of action and potential adverse side effects.  She denies a personal or family history of pancreatitis, medullary thyroid carcinoma or multiple endocrine neoplasia type II.

## 2022-12-02 NOTE — Progress Notes (Signed)
Office: [phone]  /  Fax: [phone]  WEIGHT SUMMARY AND BIOMETRICS  Vitals Temp: 98.8 F (37.1 C) BP: (!) 163/101 Pulse Rate: 72 SpO2: 99 %   Anthropometric Measurements Height: '5\' 2"'$  (1.575 m) Weight: (!) 308 lb (139.7 kg) BMI (Calculated): 56.32 Weight at Last Visit: 315lb Weight Lost Since Last Visit: 7lb Starting Weight: 315lb Total Weight Loss (lbs): 7 lb (3.175 kg) Waist Measurement : 54 inches   Body Composition  Body Fat %: 58.5 % Fat Mass (lbs): 180.6 lbs Muscle Mass (lbs): 121.8 lbs Visceral Fat Rating : 24   Other Clinical Data RMR: [id number] Fasting: no Labs: no Today's Visit #: 2 Starting Date: 11/18/22   HPI  Chief Complaint: OBESITY  Amy Cardenas is here to discuss her progress with her obesity treatment plan. She is on the the Category 3 Plan and states she is following her eating plan approximately 95 % of the time. She states she is not exercising.    Interval History:  Since last office visit she is down 7 lb She is liking most of the foods on her plan She feels adequately full with meals.   She is eating out less often and her energy level is improving Support system has been good She has not started regular exercise She has been eating some of her dinnertime meat with breakfast and added veggies to her eggs She has cut back on nuts, seeds and peanut butter  Pharmacotherapy: none  PHYSICAL EXAM:  Blood pressure (!) 163/101, pulse 72, temperature 98.8 F (37.1 C), height '5\' 2"'$  (1.575 m), weight (!) 308 lb (139.7 kg), SpO2 99 %. Body mass index is 56.33 kg/m.  General: She is overweight, cooperative, alert, well developed, and in no acute distress. PSYCH: Has normal mood, affect and thought process.   Lungs: Normal breathing effort, no conversational dyspnea.  DIAGNOSTIC DATA REVIEWED:  BMET    Component Value Date/Time   NA 135 11/18/2022 1113   K 4.4 11/18/2022 1113   CL 99 11/18/2022 1113   CO2 25 11/18/2022 1113    GLUCOSE 201 (H) 11/18/2022 1113   GLUCOSE 138 (H) 07/27/2020 1335   BUN 10 11/18/2022 1113   CREATININE 0.90 11/18/2022 1113   CALCIUM 9.2 11/18/2022 1113   GFRNONAA >60 07/27/2020 1335   GFRAA >60 05/01/2020 0839   Lab Results  Component Value Date   HGBA1C 9.9 (H) 11/18/2022   HGBA1C 6.1 06/01/2013   Lab Results  Component Value Date   INSULIN 13.4 11/18/2022   Lab Results  Component Value Date   TSH 2.400 11/18/2022   CBC    Component Value Date/Time   WBC 6.7 11/18/2022 1113   WBC 7.7 10/11/2019 1020   RBC 4.92 11/18/2022 1113   RBC 4.92 10/11/2019 1020   HGB 10.7 (L) 11/18/2022 1113   HCT 35.3 11/18/2022 1113   PLT 354 11/18/2022 1113   MCV 72 (L) 11/18/2022 1113   MCH 21.7 (L) 11/18/2022 1113   MCH 22.0 (L) 10/11/2019 1020   MCHC 30.3 (L) 11/18/2022 1113   MCHC 29.0 (L) 10/11/2019 1020   RDW 16.0 (H) 11/18/2022 1113   Iron Studies No results found for: "IRON", "TIBC", "FERRITIN", "IRONPCTSAT" Lipid Panel     Component Value Date/Time   CHOL 192 11/18/2022 1113   TRIG 248 (H) 11/18/2022 1113   HDL 31 (L) 11/18/2022 1113   CHOLHDL 6.2 (H) 11/18/2022 1113   CHOLHDL 4.9 07/27/2020 1335   VLDL 30 07/27/2020 1335   LDLCALC  117 (H) 11/18/2022 1113   Hepatic Function Panel     Component Value Date/Time   PROT 6.7 11/18/2022 1113   ALBUMIN 3.9 11/18/2022 1113   AST 9 11/18/2022 1113   ALT 12 11/18/2022 1113   ALKPHOS 73 11/18/2022 1113   BILITOT 0.7 11/18/2022 1113      Component Value Date/Time   TSH 2.400 11/18/2022 1113   Nutritional Lab Results  Component Value Date   VD25OH 30.0 11/18/2022     ASSESSMENT AND PLAN  TREATMENT PLAN FOR OBESITY:  Recommended Dietary Goals  Amy Cardenas is currently in the action stage of change. As such, her goal is to continue weight management plan. She has agreed to the Category 3 Plan.  Behavioral Intervention  We discussed the following Behavioral Modification Strategies today: increasing lean protein  intake, increasing vegetables, increasing water intake, work on meal planning and easy cooking plans, and decreasing sodium intake.  Additional resources provided today: NA  Recommended Physical Activity Goals  Amy Cardenas has been advised to work up to 150 minutes of moderate intensity aerobic activity a week and strengthening exercises 2-3 times per week for cardiovascular health, weight loss maintenance and preservation of muscle mass.   She has agreed to increase physical activity in their day and reduce sedentary time (increase NEAT).    Pharmacotherapy We discussed various medication options to help Amy Cardenas with her weight loss efforts and we both agreed to [employer].  ASSOCIATED CONDITIONS ADDRESSED TODAY  Resistant hypertension Assessment & Plan: Patient has continually high blood pressure readings.  She is currently on atenolol 100 mg once daily.  Due to her sulfa allergy, she is unable to take HCTZ or chlorthalidone.  She has a history of lip swelling from ACE inhibitors, ARB's and amlodipine.  She is actively working on weight reduction and wearing a CPAP at night.  She has reduced her intake of sodium.  She does have a positive family history of for hypertension and cardiovascular disease.  Referral to allergy made to help manage her resistant hypertension given her previous medication reactions.  Orders: -     Ambulatory referral to Cardiology  Morbid obesity ([place])  BMI 50.0-59.9, adult ([place])  Diabetes mellitus without complication [facility]) Assessment & Plan: Lab Results  Component Value Date   HGBA1C 9.9 (H) 11/18/2022   Reviewed labs from 11/18/2022 with patient.  Diabetes is poorly controlled on glipizide 10 mg twice daily.  She has previously tried metformin but discontinued due to fear of risk of continuing.  She denies issues with hypoglycemia.  She has started reducing her intake of sugar and consuming her prescribed dietary plan.  Continue active plan for weight  reduction, introducing more regular exercise.  Begin Mounjaro 2.5 mg once weekly injection for type 2 diabetes with the added benefit of weight reduction.  We reviewed mechanism of action and potential adverse side effects.  She denies a personal or family history of pancreatitis, medullary thyroid carcinoma or multiple endocrine neoplasia type II.  Orders: -     Tirzepatide; Inject 2.5 mg into the skin once a week.  Dispense: 2 mL; Refill: 0  Vitamin D deficiency Assessment & Plan: Last vitamin D Lab Results  Component Value Date   VD25OH 30.0 11/18/2022   Reviewed lab results from last visit including vitamin D level at 72.  She does complain of fatigue.  We discussed consequences of vitamin D deficiency including poor immune system, osteoporosis and low energy.  Begin prescription vitamin D 50,000 IU once weekly.  Recheck level in 3 to 4 months.  Orders: -     Vitamin D (Ergocalciferol); Take 1 capsule (50,000 Units total) by mouth every 7 (seven) days.  Dispense: 5 capsule; Refill: 0  Iron deficiency anemia due to chronic blood loss Assessment & Plan: Patient has a history of iron deficiency anemia with dysfunctional uterine bleeding.  Her last menses was October 2023 and she is currently perimenopausal.  She was taking an oral over-the-counter iron supplement but has been inconsistent in taking it over the past 2 months.  We reviewed her lab results showing a microcytic hypochromic anemia.  Her hemoglobin is stable at 10.7.  She may resume her over-the-counter iron supplement once daily.  Cautioned about constipation.  Recheck iron panel in 3 months.       Return in about 4 weeks (around 12/30/2022).[name] She was informed of the importance of frequent follow up visits to maximize her success with intensive lifestyle modifications for her multiple health conditions.   ATTESTASTION STATEMENTS:  Reviewed by clinician on day of visit: allergies, medications, problem list, medical history,  surgical history, family history, social history, and previous encounter notes pertinent to obesity diagnosis.   I have personally spent 30 minutes total time today in preparation, patient care, nutritional counseling and documentation for this visit, including the following: review of clinical lab tests; review of medical tests/procedures/services.      Dell Ponto, DO DABFM, DABOM [facility] [address] [place], [place] [address] [phone]

## 2022-12-02 NOTE — Assessment & Plan Note (Signed)
Patient has continually high blood pressure readings.  She is currently on atenolol 100 mg once daily.  Due to her sulfa allergy, she is unable to take HCTZ or chlorthalidone.  She has a history of lip swelling from ACE inhibitors, ARB's and amlodipine.  She is actively working on weight reduction and wearing a CPAP at night.  She has reduced her intake of sodium.  She does have a positive family history of for hypertension and cardiovascular disease.  Referral to allergy made to help manage her resistant hypertension given her previous medication reactions.

## 2022-12-02 NOTE — Assessment & Plan Note (Signed)
Patient has a history of iron deficiency anemia with dysfunctional uterine bleeding.  Her last menses was October 2023 and she is currently perimenopausal.  She was taking an oral over-the-counter iron supplement but has been inconsistent in taking it over the past 2 months.  We reviewed her lab results showing a microcytic hypochromic anemia.  Her hemoglobin is stable at 10.7.  She may resume her over-the-counter iron supplement once daily.  Cautioned about constipation.  Recheck iron panel in 3 months.

## 2022-12-24 ENCOUNTER — Telehealth: Payer: Self-pay

## 2022-12-24 NOTE — Telephone Encounter (Signed)
Received TOC referral from [facility] for morbid obesity, essential hypertension, snoring, excessive somnolence and OSA. Placed in sleep mailbox

## 2022-12-28 DIAGNOSIS — G4733 Obstructive sleep apnea (adult) (pediatric): Secondary | ICD-10-CM | POA: Diagnosis not present

## 2023-01-01 ENCOUNTER — Other Ambulatory Visit (INDEPENDENT_AMBULATORY_CARE_PROVIDER_SITE_OTHER): Payer: Self-pay | Admitting: Family Medicine

## 2023-01-01 DIAGNOSIS — E119 Type 2 diabetes mellitus without complications: Secondary | ICD-10-CM

## 2023-01-06 ENCOUNTER — Ambulatory Visit (INDEPENDENT_AMBULATORY_CARE_PROVIDER_SITE_OTHER): Payer: BC Managed Care – PPO | Admitting: Family Medicine

## 2023-01-06 ENCOUNTER — Encounter (INDEPENDENT_AMBULATORY_CARE_PROVIDER_SITE_OTHER): Payer: Self-pay | Admitting: Family Medicine

## 2023-01-06 VITALS — BP 157/89 | HR 78 | Temp 98.5°F | Ht 62.0 in | Wt 300.0 lb

## 2023-01-06 DIAGNOSIS — E559 Vitamin D deficiency, unspecified: Secondary | ICD-10-CM

## 2023-01-06 DIAGNOSIS — E089 Diabetes mellitus due to underlying condition without complications: Secondary | ICD-10-CM

## 2023-01-06 DIAGNOSIS — I1A0 Resistant hypertension: Secondary | ICD-10-CM | POA: Diagnosis not present

## 2023-01-06 DIAGNOSIS — Z7985 Long-term (current) use of injectable non-insulin antidiabetic drugs: Secondary | ICD-10-CM

## 2023-01-06 DIAGNOSIS — E119 Type 2 diabetes mellitus without complications: Secondary | ICD-10-CM

## 2023-01-06 DIAGNOSIS — Z6841 Body Mass Index (BMI) 40.0 and over, adult: Secondary | ICD-10-CM

## 2023-01-06 MED ORDER — TIRZEPATIDE 5 MG/0.5ML ~~LOC~~ SOAJ
5.0000 mg | SUBCUTANEOUS | 0 refills | Status: DC
Start: 2023-01-06 — End: 2023-01-23
  Filled 2023-01-23: qty 2, 28d supply, fill #0

## 2023-01-06 MED ORDER — VITAMIN D (ERGOCALCIFEROL) 1.25 MG (50000 UNIT) PO CAPS
50000.0000 [IU] | ORAL_CAPSULE | ORAL | 0 refills | Status: DC
Start: 2023-01-06 — End: 2023-02-13

## 2023-01-06 NOTE — Assessment & Plan Note (Signed)
Last vitamin D Lab Results  Component Value Date   VD25OH 30.0 11/18/2022   Taking RX vitamin D 50,000 IU once weekly.  Energy level starting to improve.  Continue RX vitamin D 50,000 IU weekly Recheck level in 2 mos.

## 2023-01-06 NOTE — Progress Notes (Signed)
Office: [phone]  /  Fax: [phone]  WEIGHT SUMMARY AND BIOMETRICS  Starting Date: 11/18/22  Starting Weight: 315lb   Weight Lost Since Last Visit: 8lb   Vitals Temp: 98.5 F (36.9 C) BP: (!) 157/89 Pulse Rate: 78 SpO2: 100 %   Body Composition  Body Fat %: 58 % Fat Mass (lbs): 174 lbs Muscle Mass (lbs): 119.6 lbs Visceral Fat Rating : 23   HPI  Chief Complaint: OBESITY  Amy Cardenas is here to discuss her progress with her obesity treatment plan. She is on the the Category 3 Plan and states she is following her eating plan approximately 95 % of the time. She states she is exercising 20 minutes 1-2 times per week.   Interval History:  Since last office visit she is down 8 lb This gives her a net weight loss is 15 lb in the past 2 mos She has preserved almost all of her lean muscle mass She is liking the satiety and reduction in food cravings on Mounjaro 2.5 mg weekly She is not skipping meals Portion sizes have reduced and she is not needing to snack as much She was getting in more fruits and veggies Her son has been supportive She joined the [employer] Her grandmother has died since her last visit She celebrated Easter with some dessert  Pharmacotherapy: Mounjaro 2.5 mg weekly  PHYSICAL EXAM:  Blood pressure (!) 157/89, pulse 78, temperature 98.5 F (36.9 C), height 5\' 2"  (1.575 m), weight 300 lb (136.1 kg), SpO2 100 %. Body mass index is 54.87 kg/m.  General: She is overweight, cooperative, alert, well developed, and in no acute distress. PSYCH: Has normal mood, affect and thought process.   Lungs: Normal breathing effort, no conversational dyspnea.   ASSESSMENT AND PLAN  TREATMENT PLAN FOR OBESITY:  Recommended Dietary Goals  Amy Cardenas is currently in the action stage of change. As such, her goal is to continue weight management plan. She has agreed to the Category 3 Plan.  Behavioral Intervention  We discussed the following Behavioral Modification  Strategies today: increasing lean protein intake, increasing vegetables, increasing fiber rich foods, avoiding skipping meals, increasing water intake, work on meal planning and preparation, reading food labels , and planning for success.  Additional resources provided today: NA  Recommended Physical Activity Goals  Amy Cardenas has been advised to work up to 150 minutes of moderate intensity aerobic activity a week and strengthening exercises 2-3 times per week for cardiovascular health, weight loss maintenance and preservation of muscle mass.   She has agreed to Work on scheduling and tracking physical activity.   Pharmacotherapy changes for the treatment of obesity: Mounjaro increased to 5 mg weekly  ASSOCIATED CONDITIONS ADDRESSED TODAY  Resistant hypertension Assessment & Plan: BP remains elevated but is improving She is taking Atenolol 100 mg daily and has a visit coming up to see cardiology 4/30.  Plan: continue current medications and keep upcoming cardiology visit.  Continue to actively work on weight reduction.     Vitamin D deficiency Assessment & Plan: Last vitamin D Lab Results  Component Value Date   VD25OH 30.0 11/18/2022   Taking RX vitamin D 50,000 IU once weekly.  Energy level starting to improve.  Continue RX vitamin D 50,000 IU weekly Recheck level in 2 mos.  Orders: -     Vitamin D (Ergocalciferol); Take 1 capsule (50,000 Units total) by mouth every 7 (seven) days.  Dispense: 5 capsule; Refill: 0  Diabetes mellitus without complication Assessment & Plan: Doing  well on Mounjaro 2.5 mg weekly without adverse side effects Stopped metformin due to fear of cancer risk Taking glipizide 10 mg bid.  Denies hypoglycemia but does not have a glucometer to check sugars.  Actively working on reducing intake of sugar and starches.  Lab Results  Component Value Date   HGBA1C 9.9 (H) 11/18/2022   Plan:  continue prescribed meal plan.   Increase Mounjaro to 5 mg once  weekly injection Reduce glipizide to 10 mg once daily Increase walking time to 20 min daily. Recheck A1c in 2 mos. Begin checking home glucose readings AM fasting and 2 hrs after dinner   Orders: -     Tirzepatide; Inject 5 mg into the skin once a week.  Dispense: 2 mL; Refill: 0  Morbid obesity  BMI 50.0-59.9, adult      She was informed of the importance of frequent follow up visits to maximize her success with intensive lifestyle modifications for her multiple health conditions.   ATTESTASTION STATEMENTS:  Reviewed by clinician on day of visit: allergies, medications, problem list, medical history, surgical history, family history, social history, and previous encounter notes pertinent to obesity diagnosis.   I have personally spent 30 minutes total time today in preparation, patient care, nutritional counseling and documentation for this visit, including the following: review of clinical lab tests; review of medical tests/procedures/services.      Glennis Brink, DO DABFM, DABOM [facility] [address] [place], [place] [address] [phone]

## 2023-01-06 NOTE — Assessment & Plan Note (Addendum)
Doing well on Mounjaro 2.5 mg weekly without adverse side effects Stopped metformin due to fear of cancer risk Taking glipizide 10 mg bid.  Denies hypoglycemia but does not have a glucometer to check sugars.  Actively working on reducing intake of sugar and starches.  Lab Results  Component Value Date   HGBA1C 9.9 (H) 11/18/2022   Plan:  continue prescribed meal plan.   Increase Mounjaro to 5 mg once weekly injection Reduce glipizide to 10 mg once daily Increase walking time to 20 min daily. Recheck A1c in 2 mos. Begin checking home glucose readings AM fasting and 2 hrs after dinner

## 2023-01-06 NOTE — Assessment & Plan Note (Addendum)
BP remains elevated but is improving She is taking Atenolol 100 mg daily and has a visit coming up to see cardiology 4/30.  Plan: continue current medications and keep upcoming cardiology visit.  Continue to actively work on weight reduction.

## 2023-01-21 ENCOUNTER — Encounter: Payer: Self-pay | Admitting: Internal Medicine

## 2023-01-21 ENCOUNTER — Other Ambulatory Visit (HOSPITAL_COMMUNITY): Payer: Self-pay

## 2023-01-21 ENCOUNTER — Ambulatory Visit: Payer: BC Managed Care – PPO | Attending: Internal Medicine | Admitting: Internal Medicine

## 2023-01-21 VITALS — BP 128/84 | Ht 61.0 in | Wt 302.0 lb

## 2023-01-21 DIAGNOSIS — I10 Essential (primary) hypertension: Secondary | ICD-10-CM

## 2023-01-21 MED ORDER — HYDROXYZINE PAMOATE 25 MG PO CAPS
25.0000 mg | ORAL_CAPSULE | Freq: Every evening | ORAL | 6 refills | Status: DC | PRN
  Filled 2023-01-21: qty 60, 30d supply, fill #0
  Filled 2023-05-09: qty 60, 30d supply, fill #1
  Filled 2023-06-19: qty 60, 30d supply, fill #2

## 2023-01-21 MED ORDER — FLUTICASONE-SALMETEROL 250-50 MCG/ACT IN AEPB
1.0000 | INHALATION_SPRAY | Freq: Every day | RESPIRATORY_TRACT | 2 refills | Status: AC
  Filled 2023-01-21: qty 60, 30d supply, fill #0
  Filled 2023-07-03: qty 60, 30d supply, fill #1

## 2023-01-21 NOTE — Patient Instructions (Signed)
Medication Instructions:  Your physician recommends that you continue on your current medications as directed. Please refer to the Current Medication list given to you today.  *If you need a refill on your cardiac medications before your next appointment, please call your pharmacy*   Lab Work: NONE   If you have labs (blood work) drawn today and your tests are completely normal, you will receive your results only by: MyChart Message (if you have MyChart) OR A paper copy in the mail If you have any lab test that is abnormal or we need to change your treatment, we will call you to review the results.   Testing/Procedures: NONE    Follow-Up: At [facility], you and your health needs are our priority.  As part of our continuing mission to provide you with exceptional heart care, we have created designated Provider Care Teams.  These Care Teams include your primary Cardiologist (physician) and Advanced Practice Providers (APPs -  Physician Assistants and Nurse Practitioners) who all work together to provide you with the care you need, when you need it.  We recommend signing up for the patient portal called "MyChart".  Sign up information is provided on this After Visit Summary.  MyChart is used to connect with patients for Virtual Visits (Telemedicine).  Patients are able to view lab/test results, encounter notes, upcoming appointments, etc.  Non-urgent messages can be sent to your provider as well.   To learn more about what you can do with MyChart, go to [email or web address].    Your next appointment:    As Needed   Provider:   You may see Vishnu P Mallipeddi, MD or one of the following Advanced Practice Providers on your designated Care Team:   Randall An, PA-C  Jacolyn Reedy, PA-C     Other Instructions Thank you for choosing [facility]!

## 2023-01-21 NOTE — Progress Notes (Addendum)
Cardiology Office Note  Date: 01/21/2023   ID: Amy Cardenas, DOB 01-26-1971, MRN [id number]  PCP:  Reather Converse, PA-C  Cardiologist:  Marjo Bicker, MD Electrophysiologist:  None   Reason for Office Visit: Management of HTN  History of Present Illness: Amy Cardenas is a 52 y.o. female known to have HTN, DM 2 was referred to cardiology clinic for management of HTN.  Patient was stressed out recently about some personal stuff and her blood pressures were noted to be elevated in her PCPs clinic. Hence, she was sent to the cardiology clinic for further management of HTN. She is only on 1 medication, atenolol 100 mg once daily for HTN management.  She has no symptoms of angina, DOE, palpitations, syncope or leg swelling.  Overall doing great. She is motivated to exercise and have a strict diet control.  She is also taking Mounjaro for weight loss.  Past Medical History:  Diagnosis Date   Abnormal mammogram of right breast 08/04/2018   Anemia    Anxiety    Back pain    Diabetes mellitus without complication (HCC) 05/26/2018   High cholesterol    Hypertension    Obesity    Pre-diabetes    Sleep apnea    SOB (shortness of breath)    Umbilical hernia     Past Surgical History:  Procedure Laterality Date   BREAST LUMPECTOMY WITH RADIOACTIVE SEED LOCALIZATION Right 08/04/2018   Procedure: RADIOACTIVE SEED GUIDED RIGHT BREAST LUMPECTOMY;  Surgeon: Claud Kelp, MD;  Location: [facility] OR;  Service: General;  Laterality: Right;   CESAREAN SECTION  1999, 2014   x2   CESAREAN SECTION WITH BILATERAL TUBAL LIGATION  10/15/2012   Procedure: CESAREAN SECTION WITH BILATERAL TUBAL LIGATION;  Surgeon: Philip Aspen, DO;  Location: [facility];  Service: Obstetrics;  Laterality: N/A;  Repeat   TUBAL LIGATION     WISDOM TOOTH EXTRACTION  1990    Current Outpatient Medications  Medication Sig Dispense Refill   albuterol (PROVENTIL HFA;VENTOLIN HFA) 108 (90 Base) MCG/ACT inhaler Inhale 1  puff into the lungs every 6 (six) hours as needed for wheezing or shortness of breath.     atenolol (TENORMIN) 100 MG tablet Take 100 mg by mouth daily.     Ferrous Sulfate (IRON PO) Take by mouth.     Fluticasone-Salmeterol (ADVAIR DISKUS IN) Inhale into the lungs.     glipiZIDE (GLUCOTROL) 10 MG tablet Take 10 mg by mouth daily before breakfast.     MAGNESIUM PO Take by mouth.     Multiple Vitamin (MULTIVITAMIN ADULT PO) Take by mouth.     Vitamin D, Ergocalciferol, (DRISDOL) 1.25 MG (50000 UNIT) CAPS capsule Take 1 capsule (50,000 Units total) by mouth every 7 (seven) days. 5 capsule 0   tirzepatide (MOUNJARO) 5 MG/0.5ML Pen Inject 5 mg into the skin once a week. (Patient not taking: Reported on 01/21/2023) 2 mL 0   No current facility-administered medications for this visit.   Allergies:  Penicillins, Losartan, Misc. sulfonamide containing compounds, Norvasc [amlodipine], and Sulfa antibiotics   Social History: The patient  reports that she quit smoking about 14 years ago. Her smoking use included cigarettes. She started smoking about 15 years ago. She has a 0.45 pack-year smoking history. She has never used smokeless tobacco. She reports that she does not drink alcohol and does not use drugs.   Family History: The patient's family history includes Alcohol abuse in her father; Anxiety disorder in her father; Cancer in  her father; Colon cancer in her father and maternal grandmother; Depression in her father; Diabetes in her father; Hyperlipidemia in her father; Hypertension in her father; Liver disease in her father.   ROS:  Please see the history of present illness. Otherwise, complete review of systems is positive for none.  All other systems are reviewed and negative.   Physical Exam: VS:  BP 128/84   Ht 5\' 1"  (1.549 m)   Wt (!) 302 lb (137 kg)   SpO2 97%   BMI 57.06 kg/m , BMI Body mass index is 57.06 kg/m.  Wt Readings from Last 3 Encounters:  01/21/23 (!) 302 lb (137 kg)   01/06/23 300 lb (136.1 kg)  12/02/22 (!) 308 lb (139.7 kg)    General: Patient appears comfortable at rest. HEENT: Conjunctiva and lids normal, oropharynx clear with moist mucosa. Neck: Supple, no elevated JVP or carotid bruits, no thyromegaly. Lungs: Clear to auscultation, nonlabored breathing at rest. Cardiac: Regular rate and rhythm, no S3 or significant systolic murmur, no pericardial rub. Abdomen: Soft, nontender, no hepatomegaly, bowel sounds present, no guarding or rebound. Extremities: No pitting edema, distal pulses 2+. Skin: Warm and dry. Musculoskeletal: No kyphosis. Neuropsychiatric: Alert and oriented x3, affect grossly appropriate.  Recent Labwork: 11/18/2022: ALT 12; AST 9; BUN 10; Creatinine, Ser 0.90; Hemoglobin 10.7; Platelets 354; Potassium 4.4; Sodium 135; TSH 2.400     Component Value Date/Time   CHOL 192 11/18/2022 1113   TRIG 248 (H) 11/18/2022 1113   HDL 31 (L) 11/18/2022 1113   CHOLHDL 6.2 (H) 11/18/2022 1113   CHOLHDL 4.9 07/27/2020 1335   VLDL 30 07/27/2020 1335   LDLCALC 117 (H) 11/18/2022 1113    Assessment and Plan: Patient is a 53 year old F known to have HTN, DM 2, HLD was referred to cardiology clinic for HTN management.  # HTN, controlled: Patient was under a lot of stress recently and was noted to have elevated blood pressures in her PCP's clinic.  She is doing all right now, blood pressure controlled in office. She does not check her blood pressure at home, instructed her to check twice a day, in a.m. and p.m. Continue atenolol 100 mg once daily (probably can be switched to carvedilol in the future). Patient has allergy to lisinopril, losartan and amlodipine with lip swelling and itching. She has a rash to sulfa antibiotics. Diet and exercise counseling provided. # Diabetes mellitus type 2, not on insulin: Currently taking Mounjaro 2.5 mg weekly with no complications, on glipizide 10 mg twice daily. Diet and exercise counseling provided.    I  have spent a total of 30 minutes with patient reviewing chart, EKGs, labs and examining patient as well as establishing an assessment and plan that was discussed with the patient.  > 50% of time was spent in direct patient care.    Medication Adjustments/Labs and Tests Ordered: Current medicines are reviewed at length with the patient today.  Concerns regarding medicines are outlined above.   Tests Ordered: No orders of the defined types were placed in this encounter.   Medication Changes: No orders of the defined types were placed in this encounter.   Disposition:  Follow up  as needed  Signed, Lilyonna Steidle Verne Spurr, MD, 01/21/2023 9:53 AM    [facility] [address], [place], [place] [address]

## 2023-01-22 ENCOUNTER — Other Ambulatory Visit (HOSPITAL_COMMUNITY): Payer: Self-pay

## 2023-01-22 ENCOUNTER — Other Ambulatory Visit: Payer: Self-pay

## 2023-01-22 MED ORDER — CHLORTHALIDONE 25 MG PO TABS
25.0000 mg | ORAL_TABLET | Freq: Every day | ORAL | 0 refills | Status: DC
  Filled 2023-01-22: qty 30, 30d supply, fill #0

## 2023-01-22 MED ORDER — FENOFIBRATE 145 MG PO TABS
145.0000 mg | ORAL_TABLET | Freq: Every day | ORAL | 6 refills | Status: DC
  Filled 2023-01-22: qty 30, 30d supply, fill #0

## 2023-01-22 MED ORDER — GLIPIZIDE 10 MG PO TABS
10.0000 mg | ORAL_TABLET | Freq: Two times a day (BID) | ORAL | 6 refills | Status: DC
  Filled 2023-01-22: qty 60, 30d supply, fill #0

## 2023-01-23 ENCOUNTER — Other Ambulatory Visit (HOSPITAL_COMMUNITY): Payer: Self-pay

## 2023-01-23 ENCOUNTER — Other Ambulatory Visit: Payer: Self-pay

## 2023-01-23 ENCOUNTER — Other Ambulatory Visit: Payer: Self-pay | Admitting: Family Medicine

## 2023-01-23 DIAGNOSIS — E119 Type 2 diabetes mellitus without complications: Secondary | ICD-10-CM

## 2023-01-23 MED ORDER — TIRZEPATIDE 5 MG/0.5ML ~~LOC~~ SOAJ
5.0000 mg | SUBCUTANEOUS | 0 refills | Status: DC
Start: 2023-01-23 — End: 2023-01-26
  Filled 2023-01-23 – 2023-01-24 (×2): qty 2, 28d supply, fill #0

## 2023-01-23 NOTE — Telephone Encounter (Signed)
From: Amy Cardenas To: Office of Glennis Brink, DO Sent: 01/22/2023 1:39 PM EDT Subject: Medication Renewal Request  Refills have been requested for the following medications:   tirzepatide (MOUNJARO) 5 MG/0.5ML Pen [Karen E Bowen]  Patient Comment: Hello! [facility] is out of this medication. [facility] has it in stock. [facility] is slow about transferring my script. [facility] says it would be best to get a new script sent to them. The phone number there is [phone]. Could you send a script to this [facility]. I have requested all my medications to be transferred there. Thank you Amy  Preferred pharmacy: [id number] - [address], [place] - [address] Delivery method: [employer]

## 2023-01-24 ENCOUNTER — Other Ambulatory Visit (HOSPITAL_COMMUNITY): Payer: Self-pay

## 2023-01-26 MED ORDER — TIRZEPATIDE 2.5 MG/0.5ML ~~LOC~~ SOAJ: 2.5000 mg | SUBCUTANEOUS | 0 refills | Status: DC

## 2023-01-27 ENCOUNTER — Telehealth (INDEPENDENT_AMBULATORY_CARE_PROVIDER_SITE_OTHER): Payer: Self-pay | Admitting: Family Medicine

## 2023-01-27 DIAGNOSIS — G4733 Obstructive sleep apnea (adult) (pediatric): Secondary | ICD-10-CM | POA: Diagnosis not present

## 2023-01-27 NOTE — Telephone Encounter (Signed)
Prior authorization done via cover my meds for patients Mounjaro. Waiting on determination.  

## 2023-01-30 ENCOUNTER — Other Ambulatory Visit (HOSPITAL_COMMUNITY): Payer: Self-pay

## 2023-01-30 DIAGNOSIS — I10 Essential (primary) hypertension: Secondary | ICD-10-CM | POA: Diagnosis not present

## 2023-01-30 DIAGNOSIS — Z0000 Encounter for general adult medical examination without abnormal findings: Secondary | ICD-10-CM | POA: Diagnosis not present

## 2023-01-30 DIAGNOSIS — E1169 Type 2 diabetes mellitus with other specified complication: Secondary | ICD-10-CM | POA: Diagnosis not present

## 2023-01-30 DIAGNOSIS — E782 Mixed hyperlipidemia: Secondary | ICD-10-CM | POA: Diagnosis not present

## 2023-01-30 MED ORDER — GLIPIZIDE 10 MG PO TABS
10.0000 mg | ORAL_TABLET | Freq: Two times a day (BID) | ORAL | 2 refills | Status: DC
  Filled 2023-01-30 – 2023-02-13 (×2): qty 180, 90d supply, fill #0
  Filled 2023-07-03: qty 180, 90d supply, fill #1

## 2023-01-30 MED ORDER — FLUTICASONE-SALMETEROL 250-50 MCG/ACT IN AEPB
1.0000 | INHALATION_SPRAY | Freq: Every day | RESPIRATORY_TRACT | 2 refills | Status: AC
  Filled 2023-01-30 – 2023-02-13 (×2): qty 60, 60d supply, fill #0
  Filled 2023-07-03: qty 180, 180d supply, fill #0

## 2023-01-30 MED ORDER — ATENOLOL 100 MG PO TABS
100.0000 mg | ORAL_TABLET | Freq: Every day | ORAL | 2 refills | Status: DC
  Filled 2023-01-30 – 2023-02-13 (×2): qty 90, 90d supply, fill #0
  Filled 2023-05-09: qty 90, 90d supply, fill #1
  Filled 2023-06-19 – 2023-08-22 (×3): qty 90, 90d supply, fill #2

## 2023-01-30 MED ORDER — TRIAMCINOLONE ACETONIDE 55 MCG/ACT NA AERO
1.0000 | INHALATION_SPRAY | Freq: Every day | NASAL | 5 refills | Status: DC
  Filled 2023-01-30: qty 16.9, 16d supply, fill #0
  Filled 2023-07-03: qty 16.9, 1d supply, fill #0

## 2023-01-30 MED ORDER — ALBUTEROL SULFATE HFA 108 (90 BASE) MCG/ACT IN AERS
1.0000 | INHALATION_SPRAY | RESPIRATORY_TRACT | 2 refills | Status: AC | PRN
  Filled 2023-01-30: qty 20.1, 90d supply, fill #0
  Filled 2023-07-03: qty 6.7, 30d supply, fill #0
  Filled 2024-01-05: qty 6.7, 30d supply, fill #1

## 2023-01-31 ENCOUNTER — Other Ambulatory Visit (HOSPITAL_COMMUNITY): Payer: Self-pay

## 2023-02-03 ENCOUNTER — Ambulatory Visit (INDEPENDENT_AMBULATORY_CARE_PROVIDER_SITE_OTHER): Payer: BC Managed Care – PPO | Admitting: Family Medicine

## 2023-02-11 NOTE — Telephone Encounter (Signed)
PA for Greggory Keen has been approved. 02/11/23 Authorization Expiration Date: 01/27/2024

## 2023-02-13 ENCOUNTER — Other Ambulatory Visit (HOSPITAL_COMMUNITY): Payer: Self-pay

## 2023-02-13 ENCOUNTER — Encounter (INDEPENDENT_AMBULATORY_CARE_PROVIDER_SITE_OTHER): Payer: Self-pay | Admitting: Family Medicine

## 2023-02-13 ENCOUNTER — Ambulatory Visit (INDEPENDENT_AMBULATORY_CARE_PROVIDER_SITE_OTHER): Payer: BC Managed Care – PPO | Admitting: Family Medicine

## 2023-02-13 VITALS — BP 161/87 | HR 69 | Temp 98.0°F | Ht 62.0 in | Wt 290.0 lb

## 2023-02-13 DIAGNOSIS — Z7984 Long term (current) use of oral hypoglycemic drugs: Secondary | ICD-10-CM

## 2023-02-13 DIAGNOSIS — E119 Type 2 diabetes mellitus without complications: Secondary | ICD-10-CM

## 2023-02-13 DIAGNOSIS — E559 Vitamin D deficiency, unspecified: Secondary | ICD-10-CM

## 2023-02-13 DIAGNOSIS — Z7985 Long-term (current) use of injectable non-insulin antidiabetic drugs: Secondary | ICD-10-CM

## 2023-02-13 DIAGNOSIS — I1A0 Resistant hypertension: Secondary | ICD-10-CM

## 2023-02-13 DIAGNOSIS — Z6841 Body Mass Index (BMI) 40.0 and over, adult: Secondary | ICD-10-CM

## 2023-02-13 MED ORDER — VITAMIN D (ERGOCALCIFEROL) 1.25 MG (50000 UNIT) PO CAPS
50000.0000 [IU] | ORAL_CAPSULE | ORAL | 0 refills | Status: DC
Start: 2023-02-13 — End: 2023-03-17
  Filled 2023-02-13 (×2): qty 5, 35d supply, fill #0

## 2023-02-13 MED ORDER — TIRZEPATIDE 5 MG/0.5ML ~~LOC~~ SOAJ
5.0000 mg | SUBCUTANEOUS | 0 refills | Status: DC
Start: 2023-02-13 — End: 2023-03-17
  Filled 2023-02-13 – 2023-02-18 (×3): qty 2, 28d supply, fill #0

## 2023-02-13 NOTE — Assessment & Plan Note (Signed)
Last vitamin D Lab Results  Component Value Date   VD25OH 30.0 11/18/2022   She is doing well on prescription vitamin D 50,000 IU once weekly.  Energy level is starting to improve.  She denies adverse side effects.  Recheck vitamin D level next visit

## 2023-02-13 NOTE — Assessment & Plan Note (Signed)
Lab Results  Component Value Date   HGBA1C 9.9 (H) 11/18/2022   She is back on Mounjaro 5 mg once weekly injection after a 4-week gap due to shortages.  She is enjoying the added satiety without adverse side effects.  She plans to get a new glucometer to track her blood sugar readings.  She has reduced her glipizide to 10 mg once daily.  She denies symptoms of hypoglycemia.  She is down 25 pounds in the past 3 months of medically supervised weight management.  She plans to increase her exercise frequency.  Continue Mounjaro at 5 mg once weekly injection.  Continue other medications per PCP.  Recommend monitoring a.m. fasting blood glucose readings.  Continue prescribed dietary plan.  Increase walking time to 20 minutes daily.

## 2023-02-13 NOTE — Assessment & Plan Note (Signed)
Blood pressure remains elevated.  She reports good compliance taking atenolol 100 mg once daily, chlorthalidone 25 mg once daily.  She denies headache or chest pain.  She is actively working on weight reduction and reducing intake of high sodium foods.  Follow-up with PCP for management of hypertension.  Continue current medications and active plan for weight reduction.

## 2023-02-13 NOTE — Progress Notes (Signed)
Office: [phone]  /  Fax: [phone]  WEIGHT SUMMARY AND BIOMETRICS  Starting Date: 11/18/22  Starting Weight: 315lb   Weight Lost Since Last Visit: 10lb   Vitals Temp: 98 F (36.7 C) BP: (!) 161/87 Pulse Rate: 69 SpO2: 100 %   Body Composition  Body Fat %: 56.2 % Fat Mass (lbs): 163.2 lbs Muscle Mass (lbs): 120.8 lbs Visceral Fat Rating : 22    HPI  Chief Complaint: OBESITY  Amy Cardenas is here to discuss her progress with her obesity treatment plan. She is on the the Category 2 Plan and states she is following her eating plan approximately 90 % of the time. She states she is exercising 20 minutes 1 times per week.   Interval History:  Since last office visit she is down 10 lb She is down a total of 25 lb in  the past 3 mos This is a 7.9% total body weight loss in the past 3 months of medically supervised weight management She is tired after work and plans to walk more this summer Her parents will watch her 76 yo daughter this summer She is trying to practice mindful eating and plan meals better so that she doesn't have to eat out as much She had a gap without Mounjaro due to shortages.  She is back on 5 mg once weekly.  She is enjoying added satiety without adverse side effects Please Changes to work on: Will move bigger meal to lunch Prioritize protein snacks  Pharmacotherapy: none   PHYSICAL EXAM:  Blood pressure (!) 161/87, pulse 69, temperature 98 F (36.7 C), height 5\' 2"  (1.575 m), weight 290 lb (131.5 kg), SpO2 100 %. Body mass index is 53.04 kg/m.  General: She is overweight, cooperative, alert, well developed, and in no acute distress. PSYCH: Has normal mood, affect and thought process.   Lungs: Normal breathing effort, no conversational dyspnea.   ASSESSMENT AND PLAN  TREATMENT PLAN FOR OBESITY:  Recommended Dietary Goals  Amy Cardenas is currently in the action stage of change. As such, her goal is to continue weight management plan. She  has agreed to category 3 meal plan.  Behavioral Intervention  We discussed the following Behavioral Modification Strategies today: increasing lean protein intake, decreasing simple carbohydrates , increasing vegetables, increasing lower glycemic fruits, avoiding skipping meals, increasing water intake, work on meal planning and preparation, keeping healthy foods at home, decreasing eating out or consumption of processed foods, and making healthy choices when eating convenient foods, work on managing stress, creating time for self-care and relaxation measures, continue to practice mindfulness when eating, and planning for success.  Additional resources provided today: NA  Recommended Physical Activity Goals  Amy Cardenas has been advised to work up to 150 minutes of moderate intensity aerobic activity a week and strengthening exercises 2-3 times per week for cardiovascular health, weight loss maintenance and preservation of muscle mass.   She has agreed to Start aerobic activity with a goal of 150 minutes a week at moderate intensity.   Pharmacotherapy changes for the treatment of obesity: None  ASSOCIATED CONDITIONS ADDRESSED TODAY  Diabetes mellitus without complication (HCC) Assessment & Plan: Lab Results  Component Value Date   HGBA1C 9.9 (H) 11/18/2022   She is back on Mounjaro 5 mg once weekly injection after a 4-week gap due to shortages.  She is enjoying the added satiety without adverse side effects.  She plans to get a new glucometer to track her blood sugar readings.  She has reduced her  glipizide to 10 mg once daily.  She denies symptoms of hypoglycemia.  She is down 25 pounds in the past 3 months of medically supervised weight management.  She plans to increase her exercise frequency.  Continue Mounjaro at 5 mg once weekly injection.  Continue other medications per PCP.  Recommend monitoring a.m. fasting blood glucose readings.  Continue prescribed dietary plan.  Increase walking time  to 20 minutes daily.  Orders: -     Tirzepatide; Inject 5 mg into the skin once a week.  Dispense: 2 mL; Refill: 0  Vitamin D deficiency Assessment & Plan: Last vitamin D Lab Results  Component Value Date   VD25OH 30.0 11/18/2022   She is doing well on prescription vitamin D 50,000 IU once weekly.  Energy level is starting to improve.  She denies adverse side effects.  Recheck vitamin D level next visit  Orders: -     Vitamin D (Ergocalciferol); Take 1 capsule (50,000 Units total) by mouth every 7 (seven) days.  Dispense: 5 capsule; Refill: 0  Resistant hypertension Assessment & Plan: Blood pressure remains elevated.  She reports good compliance taking atenolol 100 mg once daily, chlorthalidone 25 mg once daily.  She denies headache or chest pain.  She is actively working on weight reduction and reducing intake of high sodium foods.  Follow-up with PCP for management of hypertension.  Continue current medications and active plan for weight reduction.   Morbid obesity (HCC) with starting BMI 57  BMI 50.0-59.9, adult (HCC)      She was informed of the importance of frequent follow up visits to maximize her success with intensive lifestyle modifications for her multiple health conditions.   ATTESTASTION STATEMENTS:  Reviewed by clinician on day of visit: allergies, medications, problem list, medical history, surgical history, family history, social history, and previous encounter notes pertinent to obesity diagnosis.   I have personally spent 30 minutes total time today in preparation, patient care, nutritional counseling and documentation for this visit, including the following: review of clinical lab tests; review of medical tests/procedures/services.      Glennis Brink, DO DABFM, DABOM [facility] [address] [place], [place] [address] [phone]

## 2023-02-14 ENCOUNTER — Other Ambulatory Visit: Payer: Self-pay

## 2023-02-18 ENCOUNTER — Other Ambulatory Visit (HOSPITAL_COMMUNITY): Payer: Self-pay

## 2023-02-27 DIAGNOSIS — G4733 Obstructive sleep apnea (adult) (pediatric): Secondary | ICD-10-CM | POA: Diagnosis not present

## 2023-03-13 ENCOUNTER — Ambulatory Visit (INDEPENDENT_AMBULATORY_CARE_PROVIDER_SITE_OTHER): Payer: BC Managed Care – PPO | Admitting: Neurology

## 2023-03-13 ENCOUNTER — Encounter: Payer: Self-pay | Admitting: Neurology

## 2023-03-13 VITALS — BP 159/83 | HR 64 | Ht 61.0 in | Wt 292.6 lb

## 2023-03-13 DIAGNOSIS — G4733 Obstructive sleep apnea (adult) (pediatric): Secondary | ICD-10-CM | POA: Diagnosis not present

## 2023-03-13 DIAGNOSIS — Z789 Other specified health status: Secondary | ICD-10-CM | POA: Diagnosis not present

## 2023-03-13 NOTE — Patient Instructions (Addendum)
It was nice to meet you today. I am glad you are feeling better with your sleep apnea treatment. Keep up the good work!   Feel free to try the new full face mask that I provided today in a sample: it is called ResMed Airfit F40 starter pack, with 3 sizes (S, M, L).   Continue to work on weight loss.  Please continue using your autoPAP regularly. While your insurance requires that you use PAP at least 4 hours each night on 70% of the nights, I recommend, that you not skip any nights and use it throughout the night if you can. Getting used to PAP and staying with the treatment long term does take time and patience and discipline. Untreated obstructive sleep apnea when it is moderate to severe can have an adverse impact on cardiovascular health and raise her risk for heart disease, arrhythmias, hypertension, congestive heart failure, stroke and diabetes. Untreated obstructive sleep apnea causes sleep disruption, nonrestorative sleep, and sleep deprivation. This can have an impact on your day to day functioning and cause daytime sleepiness and impairment of cognitive function, memory loss, mood disturbance, and problems focussing. Using PAP regularly can improve these symptoms.  We can see you in 1 year, you can see one of our nurse practitioners.

## 2023-03-13 NOTE — Progress Notes (Signed)
Subjective:    Patient ID: Amy Cardenas is a 52 y.o. female.  HPI    Huston Foley, MD, PhD [facility] [address], [address] [address] [place], [place] [address]  I saw patient, Amy Cardenas, as a referral for transfer of care from her previous neurology office, [facility].  The patient is unaccompanied today.  Amy Cardenas is a 52 year old female with an underlying medical history of anemia, back pain, pre-diabetes, hypertension, hyperlipidemia, anxiety, and morbid obesity with a BMI of over 50, who was previously diagnosed with obstructive sleep apnea and placed on PAP therapy.  I have reviewed records from [facility] neurology.  She saw nurse practitioner Amy Cardenas on 1 02/28/2022 for sleep consultation.  She had a subsequent home sleep test through [facility] on 03/14/2022.  Her AHI was 92.6/h, O2 nadir 75%.  She was started on AutoPap therapy.  Her DME company is [employer].  She has a ResMed air sense 10 AutoSet machine.   She is followed by medical weight management and reports having lost about 25 pounds thus far.  She is not on chlorthalidone as she has a sulfa allergy.  She was not given an alternative diuretic yet.  Her Epworth sleepiness score is 13 out of 24, fatigue severity score is 36 out of 63.  She does report improvement in her symptoms, in fact, her Epworth sleepiness score in June 2023 was 19 out of 24.  She reports improvement in her sleep disruption, sleep quality, daytime energy and daytime sleepiness, as well as nocturia and snoring.  She used to have more nocturia and now gets up about once per night.  She usually tries to be in bed between 9 and 10 and rise time is around 5:30 AM.  Currently she is going to bed later because of summer camp her daughter is in.  She may be in bed between 11 and midnight and rise time still around 5:30 AM for work.  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  She lives with her 2  children, 20 year old son and 63 year old daughter.  They have no pets in the household.  She denies any recurrent morning headaches but used to have headaches in the mornings.  She does not drink caffeine daily, does not drink alcohol, she is a non-smoker, smoked a little bit several years ago but never on a regular basis. I was able to review her PAP compliance data from 30 and 90 days.  In the past month her usage has declined a little bit, she used her machine 22 out of 30 days with percent use days greater than 4 hours at 73%, indicating adequate compliance, average AHI of 0.9/h, leak on the higher side with the 95th percentile at 26.1 L/min, average pressure of 15.5 cm for the 95th percentile, pressure range of 8-20 with EPR of 2 and no ramp.  Over the past 90 days she used her machine 76 out of 90 days with percent use days greater than 4 hours at 80%, better compliance, average usage of about the same, little over 6 hours.  She uses a [employer] fullface mask size extra small.  She does have some trouble tolerating the mask, it dislodges and leaks and is at times uncomfortable but she is a mouth breather.  She has mild mouth dryness from it.  She does have good motivation to continue with treatment.  Her blood pressure used to be a lot higher,  in fact, at the visit with Amy Cardenas, nurse practitioner on 02/28/2022, her blood pressure was 191/117.  Her Past Medical History Is Significant For: Past Medical History:  Diagnosis Date   Abnormal mammogram of right breast 08/04/2018   Anemia    Anxiety    Back pain    Diabetes mellitus without complication (HCC) 05/26/2018   High cholesterol    Hypertension    Obesity    Pre-diabetes    Sleep apnea    SOB (shortness of breath)    Umbilical hernia     Her Past Surgical History Is Significant For: Past Surgical History:  Procedure Laterality Date   BREAST LUMPECTOMY WITH RADIOACTIVE SEED LOCALIZATION Right 08/04/2018   Procedure:  RADIOACTIVE SEED GUIDED RIGHT BREAST LUMPECTOMY;  Surgeon: Claud Kelp, MD;  Location: [facility] OR;  Service: General;  Laterality: Right;   CESAREAN SECTION  1999, 2014   x2   CESAREAN SECTION WITH BILATERAL TUBAL LIGATION  10/15/2012   Procedure: CESAREAN SECTION WITH BILATERAL TUBAL LIGATION;  Surgeon: Philip Aspen, DO;  Location: [facility] ORS;  Service: Obstetrics;  Laterality: N/A;  Repeat   TUBAL LIGATION     WISDOM TOOTH EXTRACTION  1990    Her Family History Is Significant For: Family History  Problem Relation Age of Onset   Hyperlipidemia Father    Hypertension Father    Diabetes Father    Cancer Father        prostate and hx of colon   Colon cancer Father    Depression Father    Anxiety disorder Father    Liver disease Father    Alcohol abuse Father    Sleep apnea Maternal Uncle    Colon cancer Maternal Grandmother     Her Social History Is Significant For: Social History   Socioeconomic History   Marital status: Single    Spouse name: Not on file   Number of children: Not on file   Years of education: Not on file   Highest education level: Not on file  Occupational History   Not on file  Tobacco Use   Smoking status: Former    Packs/day: 0.15    Years: 3.00    Additional pack years: 0.00    Total pack years: 0.45    Types: Cigarettes    Start date: 08/24/2007    Quit date: 09/23/2008    Years since quitting: 14.4   Smokeless tobacco: Never  Vaping Use   Vaping Use: Never used  Substance and Sexual Activity   Alcohol use: No    Alcohol/week: 0.0 standard drinks of alcohol   Drug use: No   Sexual activity: Not Currently    Birth control/protection: Surgical  Other Topics Concern   Not on file  Social History Narrative   Not on file   Social Determinants of Health   Financial Resource Strain: Not on file  Food Insecurity: Not on file  Transportation Needs: Not on file  Physical Activity: Not on file  Stress: Not on file  Social Connections: Not on  file    Her Allergies Are:  Allergies  Allergen Reactions   Penicillins Hives, Swelling and Other (See Comments)    PATIENT HAS HAD A PCN REACTION WITH IMMEDIATE RASH, FACIAL/TONGUE/THROAT SWELLING, SOB, OR LIGHTHEADEDNESS WITH HYPOTENSION:  #  #  YES  #  #  Has patient had a PCN reaction causing severe rash involving mucus membranes or skin necrosis: No PATIENT HAS HAD A PCN REACTION THAT REQUIRED HOSPITALIZATION:  #  #  YES  #  #  Has patient had a PCN reaction occurring within the last 10 years:No     Losartan Swelling    Lip swelling   Misc. Sulfonamide Containing Compounds Itching   Norvasc [Amlodipine] Swelling    Lip swelling   Sulfa Antibiotics Rash  :   Her Current Medications Are:  Outpatient Encounter Medications as of 03/13/2023  Medication Sig   albuterol (PROVENTIL HFA;VENTOLIN HFA) 108 (90 Base) MCG/ACT inhaler Inhale 1 puff into the lungs every 6 (six) hours as needed for wheezing or shortness of breath.   albuterol (VENTOLIN HFA) 108 (90 Base) MCG/ACT inhaler Inhale 1 puff into the lungs every 4 (four) hours as needed.   atenolol (TENORMIN) 100 MG tablet Take 100 mg by mouth daily.   atenolol (TENORMIN) 100 MG tablet Take 1 tablet (100 mg total) by mouth daily.   chlorthalidone (HYGROTON) 25 MG tablet Take 1 tablet (25 mg total) by mouth daily.   fenofibrate (TRICOR) 145 MG tablet Take 1 tablet (145 mg total) by mouth daily for cholesterol.   Ferrous Sulfate (IRON PO) Take by mouth.   Fluticasone-Salmeterol (ADVAIR DISKUS IN) Inhale into the lungs.   fluticasone-salmeterol (ADVAIR) 250-50 MCG/ACT AEPB Inhale 1 puff into the lungs at bedtime.   fluticasone-salmeterol (ADVAIR) 250-50 MCG/ACT AEPB Inhale 1 puff into the lungs daily.   glipiZIDE (GLUCOTROL) 10 MG tablet Take 10 mg by mouth daily before breakfast.   glipiZIDE (GLUCOTROL) 10 MG tablet Take 1 tablet (10 mg total) by mouth 2 (two) times daily.   glipiZIDE (GLUCOTROL) 10 MG tablet Take 1 tablet (10 mg  total) by mouth 2 (two) times daily.   hydrOXYzine (VISTARIL) 25 MG capsule Take 1-2 capsules (25-50 mg total) by mouth at bedtime as needed for insomnia.   MAGNESIUM PO Take by mouth.   Multiple Vitamin (MULTIVITAMIN ADULT PO) Take by mouth.   tirzepatide (MOUNJARO) 5 MG/0.5ML Pen Inject 5 mg into the skin once a week.   triamcinolone (NASACORT) 55 MCG/ACT AERO nasal inhaler Place 1 spray into each nostril daily.   Vitamin D, Ergocalciferol, (DRISDOL) 1.25 MG (50000 UNIT) CAPS capsule Take 1 capsule (50,000 Units total) by mouth every 7 (seven) days.   No facility-administered encounter medications on file as of 03/13/2023.  :   Review of Systems:  Out of a complete 14 point review of systems, all are reviewed and negative with the exception of these symptoms as listed below:  Review of Systems  Neurological:        Pt here for sleep consult  Pt tries to wear CPAP machine 4 hours nightly Pt states still snores fatigue,hypertension  Pt denies headaches Pt states sleep study July 20203  ESS:13  FSS:36    Objective:  Neurological Exam  Physical Exam Physical Examination:   Vitals:   03/13/23 1535  BP: (!) 159/83  Pulse: 64    General Examination: The patient is a very pleasant 52 y.o. female in no acute distress. She appears well-developed and well-nourished and well groomed.   HEENT: Normocephalic, atraumatic, pupils are equal, round and reactive to light, extraocular tracking is good without limitation to gaze excursion or nystagmus noted. Hearing is grossly intact. Face is symmetric with normal facial animation. Speech is clear with no dysarthria noted. There is no hypophonia. There is no lip, neck/head, jaw or voice tremor. Neck is supple with full range of passive and active motion. There are no carotid bruits on auscultation. Oropharynx exam reveals: mild mouth dryness,  adequate dental hygiene and moderate airway crowding, due to small airway entry, tonsillar size of about  2-3+ right and 2+ on the left.  Mallampati class III.  Neck circumference 17 inches (Prior documented neck circumference in January 2023 was 18 in). Tongue protrudes centrally and palate elevates symmetrically.  Chest: Clear to auscultation without wheezing, rhonchi or crackles noted.  Heart: S1+S2+0, regular and normal without murmurs, rubs or gallops noted.   Abdomen: Soft, non-tender and non-distended.  Extremities: There is 1+ pitting edema in the distal lower extremities bilaterally.   Skin: Warm and dry without trophic changes noted.  Mild discoloration/erythema in the distal lower extremities bilaterally.  Musculoskeletal: exam reveals no obvious joint deformities.   Neurologically:  Mental status: The patient is awake, alert and oriented in all 4 spheres. Her immediate and remote memory, attention, language skills and fund of knowledge are appropriate. There is no evidence of aphasia, agnosia, apraxia or anomia. Speech is clear with normal prosody and enunciation. Thought process is linear. Mood is normal and affect is normal.  Cranial nerves II - XII are as described above under HEENT exam.  Motor exam: Normal bulk, strength and tone is noted. There is no obvious action or resting tremor.  Fine motor skills and coordination: grossly intact.  Cerebellar testing: No dysmetria or intention tremor. There is no truncal or gait ataxia.  Sensory exam: intact to light touch in the upper and lower extremities.  Gait, station and balance: She stands easily. No veering to one side is noted. No leaning to one side is noted. Posture is age-appropriate and stance is narrow based. Gait shows normal stride length and normal pace. No problems turning are noted.   Assessment and Plan:  In summary, Amy Cardenas is a very pleasant 52 y.o.-year old female with an underlying medical history of anemia, back pain, pre-diabetes, hypertension, hyperlipidemia, anxiety, and morbid obesity with a BMI of over 50,  who presents for evaluation of her obstructive sleep apnea which was diagnosed as severe with home sleep testing on 03/14/2022.  She presents for transfer of care.  Her AHI was 92.6/h, O2 nadir 75%.  She established treatment with AutoPap therapy and is currently compliant with treatment.  She has a ResMed air sense 10 AutoSet machine, current pressure setting of 8 cm to 20 cm with EPR of 2, without ramp.  She uses an XS fullface mask from [employer].  She is commended for her treatment adherence, apnea scores are very good, compliance has slipped a little bit in the past month, she admits that she has fallen asleep occasionally without putting it on on the weekends.  She is very motivated to continue with treatment.  She is given a sample mask from [employer] today to try out, it is a F40 fullface mask, she was given a starter pack, probably best to try the small size.  We talked about her sleep test results, we talked about the importance of maintaining a healthy lifestyle and working on weight loss.  We talked about the importance of treating sleep apnea not just for symptom control but also for long-term risk factor reduction.  At this juncture, she is advised to follow-up routinely in sleep clinic to see one of our nurse practitioners in 1 year.  She can always get in touch via MyChart messaging or phone call if she has any interim questions or concerns.  I answered all her questions today and she was in agreement.  Huston Foley, MD, PhD

## 2023-03-17 ENCOUNTER — Other Ambulatory Visit (HOSPITAL_COMMUNITY): Payer: Self-pay

## 2023-03-17 ENCOUNTER — Other Ambulatory Visit: Payer: Self-pay

## 2023-03-17 ENCOUNTER — Encounter (INDEPENDENT_AMBULATORY_CARE_PROVIDER_SITE_OTHER): Payer: Self-pay | Admitting: Family Medicine

## 2023-03-17 ENCOUNTER — Ambulatory Visit (INDEPENDENT_AMBULATORY_CARE_PROVIDER_SITE_OTHER): Payer: BC Managed Care – PPO | Admitting: Family Medicine

## 2023-03-17 VITALS — BP 151/76 | HR 70 | Temp 97.7°F | Ht 62.0 in | Wt 286.0 lb

## 2023-03-17 DIAGNOSIS — I1A0 Resistant hypertension: Secondary | ICD-10-CM

## 2023-03-17 DIAGNOSIS — Z7985 Long-term (current) use of injectable non-insulin antidiabetic drugs: Secondary | ICD-10-CM

## 2023-03-17 DIAGNOSIS — E119 Type 2 diabetes mellitus without complications: Secondary | ICD-10-CM

## 2023-03-17 DIAGNOSIS — E559 Vitamin D deficiency, unspecified: Secondary | ICD-10-CM

## 2023-03-17 DIAGNOSIS — Z7984 Long term (current) use of oral hypoglycemic drugs: Secondary | ICD-10-CM

## 2023-03-17 DIAGNOSIS — E7849 Other hyperlipidemia: Secondary | ICD-10-CM | POA: Diagnosis not present

## 2023-03-17 DIAGNOSIS — Z6841 Body Mass Index (BMI) 40.0 and over, adult: Secondary | ICD-10-CM

## 2023-03-17 MED ORDER — VITAMIN D (ERGOCALCIFEROL) 1.25 MG (50000 UNIT) PO CAPS
50000.0000 [IU] | ORAL_CAPSULE | ORAL | 0 refills | Status: DC
Start: 2023-03-17 — End: 2023-05-06
  Filled 2023-03-17: qty 5, 35d supply, fill #0

## 2023-03-17 MED ORDER — TIRZEPATIDE 7.5 MG/0.5ML ~~LOC~~ SOAJ
7.5000 mg | SUBCUTANEOUS | 1 refills | Status: DC
Start: 2023-03-17 — End: 2023-05-06
  Filled 2023-03-17: qty 2, 28d supply, fill #0
  Filled 2023-04-14: qty 2, 28d supply, fill #1

## 2023-03-17 NOTE — Assessment & Plan Note (Signed)
Patient's blood pressure remains elevated here.  She is currently on atenolol 100 mg once daily.  Her blood pressure readings outside of our practice have been within normal limits and her home blood pressure readings are averaging 120s to 130s systolic over 70s to 80s diastolic.  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  She denies chest pain or headache.  Continue current blood pressure medications.  Follow-up with cardiology and her primary care for management.  Look for improvements with weight reduction.  Continue to monitor home blood pressure readings.

## 2023-03-17 NOTE — Assessment & Plan Note (Signed)
Lab Results  Component Value Date   HGBA1C 9.9 (H) 11/18/2022   Not monitoring a.m. fasting glucose readings.  She is currently on Mounjaro 5 mg once weekly injection, glipizide 10 mg once daily.  She has refused use of metformin.  She is seeing some improvement in satiety with Mounjaro without adverse side effects.  She is actively working on improving her food choices.  Her dietary non- compliance and lack of exercise have been barriers to her progress.  Continue to work on prescribed dietary plan.  Increase walking time to 30 minutes 3 days a week.  Increase Mounjaro to 7.5 mg once weekly injection.  Watch for hypoglycemia in combination with glipizide use.  Follow-up with primary care for the rest of her diabetes treatment.

## 2023-03-17 NOTE — Assessment & Plan Note (Signed)
Lab Results  Component Value Date   CHOL 192 11/18/2022   HDL 31 (L) 11/18/2022   LDLCALC 117 (H) 11/18/2022   TRIG 248 (H) 11/18/2022   CHOLHDL 6.2 (H) 11/18/2022   On Tricor 145 mg daily. Has not been on statin therapy Working on a low saturated fat diet, exercise and weight loss Down 10% TBW in 4 mos of medically supervised wt management  Recheck FLP today

## 2023-03-17 NOTE — Progress Notes (Signed)
Office: [phone]  /  Fax: [phone]  WEIGHT SUMMARY AND BIOMETRICS  Starting Date: 11/18/22  Starting Weight: 315lb   Weight Lost Since Last Visit: 4lb   Vitals Temp: 97.7 F (36.5 C) BP: (!) 151/76 Pulse Rate: 70 SpO2: 99 %   Body Composition  Body Fat %: 56.6 % Fat Mass (lbs): 162 lbs Muscle Mass (lbs): 118 lbs Visceral Fat Rating : 22     HPI  Chief Complaint: OBESITY  Amy Cardenas is here to discuss her progress with her obesity treatment plan. She is on the the Category 3 Plan and states she is following her eating plan approximately 90 % of the time. She states she is exercising 0 minutes 0 times per week.  Interval History:  Since last office visit she is down 4 lb in the past month She is down 1.2 pounds of muscle mass and down 1.2 pounds of body fat in the past month She was trying to move her big meal to lunch but is failing to plan and pack She is still eating out when her daughter has basketball practice She has been trying to cook more She did better with fruits and veggies Her BP is high here but is better at other visits (traffic getting here) Bps at home running 120s/70s Mounjaro 5 mg is helping some with fullness feeling but this is wearing off She likes to snack on Premier Protein shakes, string cheese or greek yogurt She has been packing her lunch She had some sugar cravings around her cycle  She has a net weight loss of 29 pounds in the past 4 months of medically supervised weight management.  This is a 10% TBW loss  Pharmacotherapy: Mounjaro 5 mg weeekly  PHYSICAL EXAM:  Blood pressure (!) 151/76, pulse 70, temperature 97.7 F (36.5 C), height 5\' 2"  (1.575 m), weight 286 lb (129.7 kg), SpO2 99 %. Body mass index is 52.31 kg/m.  General: She is overweight, cooperative, alert, well developed, and in no acute distress. PSYCH: Has normal mood, affect and thought process.   Lungs: Normal breathing effort, no conversational  dyspnea.   ASSESSMENT AND PLAN  TREATMENT PLAN FOR OBESITY:  Recommended Dietary Goals  Amy Cardenas is currently in the action stage of change. As such, her goal is to continue weight management plan. She has agreed to the Category 3 Plan.  Behavioral Intervention  We discussed the following Behavioral Modification Strategies today: increasing lean protein intake, decreasing simple carbohydrates , increasing vegetables, increasing lower glycemic fruits, increasing water intake, work on meal planning and preparation, keeping healthy foods at home, continue to practice mindfulness when eating, planning for success, and better snacking choices.  Additional resources provided today: NA  Recommended Physical Activity Goals  Amy Cardenas has been advised to work up to 150 minutes of moderate intensity aerobic activity a week and strengthening exercises 2-3 times per week for cardiovascular health, weight loss maintenance and preservation of muscle mass.   She has agreed to Think about ways to increase daily physical activity and overcoming barriers to exercise  Pharmacotherapy changes for the treatment of obesity: increase Mounjaro to 7.5 mg weekly  ASSOCIATED CONDITIONS ADDRESSED TODAY  Diabetes mellitus without complication (HCC) Assessment & Plan: Lab Results  Component Value Date   HGBA1C 9.9 (H) 11/18/2022   Not monitoring a.m. fasting glucose readings.  She is currently on Mounjaro 5 mg once weekly injection, glipizide 10 mg once daily.  She has refused use of metformin.  She is seeing some  improvement in satiety with Mounjaro without adverse side effects.  She is actively working on improving her food choices.  Her dietary non- compliance and lack of exercise have been barriers to her progress.  Continue to work on prescribed dietary plan.  Increase walking time to 30 minutes 3 days a week.  Increase Mounjaro to 7.5 mg once weekly injection.  Watch for hypoglycemia in combination with  glipizide use.  Follow-up with primary care for the rest of her diabetes treatment.  Orders: -     Tirzepatide; Inject 7.5 mg into the skin once a week.  Dispense: 2 mL; Refill: 1 -     Hemoglobin A1c -     Comprehensive metabolic panel -     Lipid panel  Vitamin D deficiency Assessment & Plan: Last vitamin D Lab Results  Component Value Date   VD25OH 30.0 11/18/2022   Doing well on prescription ergocalciferol 50,000 IU once weekly.  Energy level is starting to improve.  She denies adverse side effects.  Recheck level today  Orders: -     Vitamin D (Ergocalciferol); Take 1 capsule (50,000 Units total) by mouth every 7 (seven) days.  Dispense: 5 capsule; Refill: 0 -     VITAMIN D 25 Hydroxy (Vit-D Deficiency, Fractures)  Morbid obesity (HCC) with starting BMI 57  BMI 50.0-59.9, adult (HCC)  Resistant hypertension Assessment & Plan: Patient's blood pressure remains elevated here.  She is currently on atenolol 100 mg once daily.  Her blood pressure readings outside of our practice have been within normal limits and her home blood pressure readings are averaging 120s to 130s systolic over 70s to 80s diastolic.  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  She denies chest pain or headache.  Continue current blood pressure medications.  Follow-up with cardiology and her primary care for management.  Look for improvements with weight reduction.  Continue to monitor home blood pressure readings.   Other hyperlipidemia      She was informed of the importance of frequent follow up visits to maximize her success with intensive lifestyle modifications for her multiple health conditions.   ATTESTASTION STATEMENTS:  Reviewed by clinician on day of visit: allergies, medications, problem list, medical history, surgical history, family history, social history, and previous encounter notes pertinent to obesity diagnosis.   I have personally spent 30 minutes total time  today in preparation, patient care, nutritional counseling and documentation for this visit, including the following: review of clinical lab tests; review of medical tests/procedures/services.      Glennis Brink, DO DABFM, DABOM [facility] [address] [place], [place] [address] [phone]

## 2023-03-17 NOTE — Assessment & Plan Note (Signed)
Last vitamin D Lab Results  Component Value Date   VD25OH 30.0 11/18/2022   Doing well on prescription ergocalciferol 50,000 IU once weekly.  Energy level is starting to improve.  She denies adverse side effects.  Recheck level today

## 2023-03-18 LAB — COMPREHENSIVE METABOLIC PANEL
ALT: 15 IU/L (ref 0–32)
AST: 14 IU/L (ref 0–40)
Albumin: 4.2 g/dL (ref 3.8–4.9)
Alkaline Phosphatase: 62 IU/L (ref 44–121)
BUN/Creatinine Ratio: 23 (ref 9–23)
BUN: 23 mg/dL (ref 6–24)
Bilirubin Total: 0.5 mg/dL (ref 0.0–1.2)
CO2: 22 mmol/L (ref 20–29)
Calcium: 9.6 mg/dL (ref 8.7–10.2)
Chloride: 103 mmol/L (ref 96–106)
Creatinine, Ser: 0.98 mg/dL (ref 0.57–1.00)
Globulin, Total: 3 g/dL (ref 1.5–4.5)
Glucose: 63 mg/dL — ABNORMAL LOW (ref 70–99)
Potassium: 4.2 mmol/L (ref 3.5–5.2)
Sodium: 142 mmol/L (ref 134–144)
Total Protein: 7.2 g/dL (ref 6.0–8.5)
eGFR: 70 mL/min/{1.73_m2} (ref >59)

## 2023-03-18 LAB — HEMOGLOBIN A1C
Est. average glucose Bld gHb Est-mCnc: 137 mg/dL
Hgb A1c MFr Bld: 6.4 % — ABNORMAL HIGH (ref 4.8–5.6)

## 2023-03-18 LAB — LIPID PANEL
Chol/HDL Ratio: 5.7 ratio — ABNORMAL HIGH (ref 0.0–4.4)
Cholesterol, Total: 172 mg/dL (ref 100–199)
HDL: 30 mg/dL — ABNORMAL LOW (ref >39)
LDL Chol Calc (NIH): 119 mg/dL — ABNORMAL HIGH (ref 0–99)
Triglycerides: 127 mg/dL (ref 0–149)
VLDL Cholesterol Cal: 23 mg/dL (ref 5–40)

## 2023-03-18 LAB — VITAMIN D 25 HYDROXY (VIT D DEFICIENCY, FRACTURES): Vit D, 25-Hydroxy: 58.5 ng/mL (ref 30.0–100.0)

## 2023-03-19 ENCOUNTER — Telehealth: Payer: Self-pay | Admitting: *Deleted

## 2023-03-19 NOTE — Telephone Encounter (Signed)
Order for therapy change to autopap sent to [facility].

## 2023-03-19 NOTE — Telephone Encounter (Signed)
[facility] confirmed receipt of order.  

## 2023-03-26 DIAGNOSIS — I10 Essential (primary) hypertension: Secondary | ICD-10-CM | POA: Diagnosis not present

## 2023-03-26 DIAGNOSIS — E1169 Type 2 diabetes mellitus with other specified complication: Secondary | ICD-10-CM | POA: Diagnosis not present

## 2023-03-26 DIAGNOSIS — Z0000 Encounter for general adult medical examination without abnormal findings: Secondary | ICD-10-CM | POA: Diagnosis not present

## 2023-03-29 DIAGNOSIS — G4733 Obstructive sleep apnea (adult) (pediatric): Secondary | ICD-10-CM | POA: Diagnosis not present

## 2023-04-04 ENCOUNTER — Other Ambulatory Visit (HOSPITAL_COMMUNITY): Payer: Self-pay

## 2023-04-04 ENCOUNTER — Other Ambulatory Visit: Payer: Self-pay

## 2023-04-04 DIAGNOSIS — Z23 Encounter for immunization: Secondary | ICD-10-CM | POA: Diagnosis not present

## 2023-04-04 DIAGNOSIS — E1122 Type 2 diabetes mellitus with diabetic chronic kidney disease: Secondary | ICD-10-CM | POA: Diagnosis not present

## 2023-04-04 DIAGNOSIS — I10 Essential (primary) hypertension: Secondary | ICD-10-CM | POA: Diagnosis not present

## 2023-04-04 DIAGNOSIS — Z0000 Encounter for general adult medical examination without abnormal findings: Secondary | ICD-10-CM | POA: Diagnosis not present

## 2023-04-04 DIAGNOSIS — E7841 Elevated Lipoprotein(a): Secondary | ICD-10-CM | POA: Diagnosis not present

## 2023-04-04 MED ORDER — ROSUVASTATIN CALCIUM 10 MG PO TABS
10.0000 mg | ORAL_TABLET | Freq: Every day | ORAL | 6 refills | Status: DC
  Filled 2023-04-04: qty 30, 30d supply, fill #0
  Filled 2023-05-09: qty 30, 30d supply, fill #1
  Filled 2023-06-19: qty 30, 30d supply, fill #2
  Filled 2023-07-03 – 2023-08-13 (×2): qty 30, 30d supply, fill #3
  Filled 2023-10-09: qty 30, 30d supply, fill #4

## 2023-04-14 ENCOUNTER — Other Ambulatory Visit: Payer: Self-pay

## 2023-04-24 ENCOUNTER — Ambulatory Visit (INDEPENDENT_AMBULATORY_CARE_PROVIDER_SITE_OTHER): Payer: BC Managed Care – PPO | Admitting: Family Medicine

## 2023-04-29 DIAGNOSIS — G4733 Obstructive sleep apnea (adult) (pediatric): Secondary | ICD-10-CM | POA: Diagnosis not present

## 2023-05-01 ENCOUNTER — Ambulatory Visit (INDEPENDENT_AMBULATORY_CARE_PROVIDER_SITE_OTHER): Payer: BC Managed Care – PPO | Admitting: Family Medicine

## 2023-05-05 NOTE — Progress Notes (Signed)
.smr  Office: [phone]  /  Fax: [phone]  WEIGHT SUMMARY AND BIOMETRICS  Vitals Temp: 98.3 F (36.8 C) BP: (!) 148/85 Pulse Rate: 69   Anthropometric Measurements Height: 5\' 2"  (1.575 m) Weight: 284 lb (128.8 kg) BMI (Calculated): 51.93 Weight at Last Visit: 290 lb Weight Lost Since Last Visit: 2 lb Starting Weight: 315 lb Total Weight Loss (lbs): 31 lb (14.1 kg)   Body Composition  Body Fat %: 56 % Fat Mass (lbs): 159 lbs Muscle Mass (lbs): 118.6 lbs Visceral Fat Rating : 21   Other Clinical Data RMR: 2304 Fasting: yes Labs: no Today's Visit #: 6 Starting Date: 11/18/22     HPI  Chief Complaint: OBESITY  Amy Cardenas is here to discuss her progress with her obesity treatment plan. She is on the the Category 3 Plan and states she is following her eating plan approximately 80 % of the time. She states she is exercising walking 10 minutes 2 times per week.   Interval History:  Since last office visit she down 2 lbs.  Bio impedence scale reviewed with the patient:   Hunger/appetite-moderate control. Went on vacation and got off track/doesn't feel she met protein goals as well.  Discussed journaling and she would like to try and journal 3-4 times weekly with goals of 1500-1600 calories and 100 grams of protein daily.  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 10 Sleep- Doing better -taking hydroxyzine for sleep and uses CPAP consistently.  Exercise-Nothing consistently. Plans to start walking regularly when school resumes   Pharmacotherapy: Mounjaro 7.5 mg weekly for Type 2 diabetes.  Denies mass in neck, dysphagia, dyspepsia, persistent hoarseness, abdominal pain, or N/V/Constipation or diarrhea. Has annual eye exam. Mood is stable.  Glipizide 10 mg twice daily for Type 2 diabetes. Reports no side effects.   TREATMENT PLAN FOR OBESITY:  Recommended Dietary Goals  Amy Cardenas is currently in the action stage of  change. As such, her goal is to continue weight management plan. She has agreed to the Category 3 Plan and keeping a food journal and adhering to recommended goals of 1500-1600 calories and 100 grams of protein.  Behavioral Intervention  We discussed the following Behavioral Modification Strategies today: increasing lean protein intake, decreasing simple carbohydrates , increasing vegetables, increasing lower glycemic fruits, avoiding skipping meals, increasing water intake, work on meal planning and preparation, emotional eating strategies and understanding the difference between hunger signals and cravings, work on managing stress, creating time for self-care and relaxation measures, continue to practice mindfulness when eating, planning for success, and better snacking choices.  Additional resources provided today: NA  Recommended Physical Activity Goals  Amy Cardenas has been advised to work up to 150 minutes of moderate intensity aerobic activity a week and strengthening exercises 2-3 times per week for cardiovascular health, weight loss maintenance and preservation of muscle mass.   She has agreed to Continue current level of physical activity  and Think about ways to increase daily physical activity and overcoming barriers to exercise   Pharmacotherapy We discussed various medication options to help Amy Cardenas with her weight loss efforts and we both agreed to continue Mounjaro 7.5 mg weekly for Type 2 diabetes.    Return in about 4 weeks (around 06/03/2023).[name] She was informed of the importance of frequent follow up visits to maximize her success with intensive lifestyle modifications for her multiple health conditions.  PHYSICAL EXAM:  Blood pressure (!) 148/85, pulse 69, temperature 98.3 F (36.8 C), height  5\' 2"  (1.575 m), weight 284 lb (128.8 kg). Body mass index is 51.94 kg/m.  General: She is overweight, cooperative, alert, well developed, and in no acute distress. PSYCH: Has normal  mood, affect and thought process.   Cardiovascular: HR 60's BP 148/85- improved overall.  Lungs: Normal breathing effort, no conversational dyspnea. Neuro: no focal deficit  DIAGNOSTIC DATA REVIEWED:  BMET    Component Value Date/Time   NA 142 03/17/2023 1247   K 4.2 03/17/2023 1247   CL 103 03/17/2023 1247   CO2 22 03/17/2023 1247   GLUCOSE 63 (L) 03/17/2023 1247   GLUCOSE 138 (H) 07/27/2020 1335   BUN 23 03/17/2023 1247   CREATININE 0.98 03/17/2023 1247   CALCIUM 9.6 03/17/2023 1247   GFRNONAA >60 07/27/2020 1335   GFRAA >60 05/01/2020 0839   Lab Results  Component Value Date   HGBA1C 6.4 (H) 03/17/2023   HGBA1C 6.1 06/01/2013   Lab Results  Component Value Date   INSULIN 13.4 11/18/2022   Lab Results  Component Value Date   TSH 2.400 11/18/2022   CBC    Component Value Date/Time   WBC 6.7 11/18/2022 1113   WBC 7.7 10/11/2019 1020   RBC 4.92 11/18/2022 1113   RBC 4.92 10/11/2019 1020   HGB 10.7 (L) 11/18/2022 1113   HCT 35.3 11/18/2022 1113   PLT 354 11/18/2022 1113   MCV 72 (L) 11/18/2022 1113   MCH 21.7 (L) 11/18/2022 1113   MCH 22.0 (L) 10/11/2019 1020   MCHC 30.3 (L) 11/18/2022 1113   MCHC 29.0 (L) 10/11/2019 1020   RDW 16.0 (H) 11/18/2022 1113   Iron Studies No results found for: "IRON", "TIBC", "FERRITIN", "IRONPCTSAT" Lipid Panel     Component Value Date/Time   CHOL 172 03/17/2023 1247   TRIG 127 03/17/2023 1247   HDL 30 (L) 03/17/2023 1247   CHOLHDL 5.7 (H) 03/17/2023 1247   CHOLHDL 4.9 07/27/2020 1335   VLDL 30 07/27/2020 1335   LDLCALC 119 (H) 03/17/2023 1247   Hepatic Function Panel     Component Value Date/Time   PROT 7.2 03/17/2023 1247   ALBUMIN 4.2 03/17/2023 1247   AST 14 03/17/2023 1247   ALT 15 03/17/2023 1247   ALKPHOS 62 03/17/2023 1247   BILITOT 0.5 03/17/2023 1247      Component Value Date/Time   TSH 2.400 11/18/2022 1113   Nutritional Lab Results  Component Value Date   VD25OH 58.5 03/17/2023   VD25OH 30.0  11/18/2022    ASSOCIATED CONDITIONS ADDRESSED TODAY  ASSESSMENT AND PLAN  Problem List Items Addressed This Visit     Diabetes mellitus (HCC) - Primary   Relevant Medications   tirzepatide (MOUNJARO) 7.5 MG/0.5ML Pen   Vitamin D deficiency   Relevant Medications   Vitamin D, Ergocalciferol, (DRISDOL) 1.25 MG (50000 UNIT) CAPS capsule   BMI 50.0-59.9, adult (HCC) Current BMI 51.9   Relevant Medications   tirzepatide (MOUNJARO) 7.5 MG/0.5ML Pen   Hypertension associated with diabetes (HCC)   Relevant Medications   tirzepatide (MOUNJARO) 7.5 MG/0.5ML Pen   Hyperlipidemia associated with type 2 diabetes mellitus (HCC)   Relevant Medications   tirzepatide (MOUNJARO) 7.5 MG/0.5ML Pen   Morbid obesity (HCC) with starting BMI 57 (Chronic)   Relevant Medications   tirzepatide (MOUNJARO) 7.5 MG/0.5ML Pen  Type 2 Diabetes Mellitus with other specified complication, without long-term current use of insulin HgbA1c is at goal. Last A1c was 6.4- much improved from previous at 9.9 when started program.   Medication(s):  Mounjaro 7.5 mg SQ weekly and glipizide 10 mg twicw daily  Glipizide 10 mg twice daily. Denies mass in neck, dysphagia, dyspepsia, persistent hoarseness, abdominal pain, or N/V/Constipation or diarrhea. Has annual eye exam. Mood is stable. No side effects with glipizide.  Lab Results  Component Value Date   HGBA1C 6.4 (H) 03/17/2023   HGBA1C 9.9 (H) 11/18/2022   HGBA1C 7.9 (H) 07/27/2020   Lab Results  Component Value Date   MICROALBUR 7.0 (H) 10/11/2019   LDLCALC 119 (H) 03/17/2023   CREATININE 0.98 03/17/2023   No results found for: "GFR"  Plan: Continue and refill Mounjaro 7.5 mg SQ weekly Continue glipizide, but hopefully can taper and stop glipizide soon.  Continue working on nutrition plan to decrease simple carbohydrates, increase lean proteins and exercise to promote weight loss and improve glycemic control. Recheck labs in 2-3 months.    Hypertension Hypertension asymptomatic, no significant medication side effects noted, borderline controlled, needs further observation, and needs improvement.  Medication(s): Atenolol 100 mg once daily  BP Readings from Last 3 Encounters:  05/06/23 (!) 148/85  03/17/23 (!) 151/76  03/13/23 (!) 159/83   Lab Results  Component Value Date   CREATININE 0.98 03/17/2023   CREATININE 0.90 11/18/2022   CREATININE 0.86 07/27/2020   No results found for: "GFR"  Plan: Continue all antihypertensives at current dosages. Continue to work on nutrition plan to promote weight loss and improve BP control.  Monitor BP closely on GLP-1 RA/GIP medication, Mounjaro as may require less medication with further weight loss.  Recheck labs in 2-3 months.   Hyperlipidemia LDL is not at goal./ HDL 30- not at goal/ Triglyceride 127- at goal.  Medication(s): fenofibrate 145 mg daily/ Rosuvastatin 10 mg daily. No side effects with medications.  Cardiovascular risk factors: diabetes mellitus, dyslipidemia, hypertension, microalbuminuria, obesity (BMI >= 30 kg/m2), and sedentary lifestyle  Lab Results  Component Value Date   CHOL 172 03/17/2023   HDL 30 (L) 03/17/2023   LDLCALC 119 (H) 03/17/2023   TRIG 127 03/17/2023   CHOLHDL 5.7 (H) 03/17/2023   CHOLHDL 6.2 (H) 11/18/2022   CHOLHDL 4.9 07/27/2020   Lab Results  Component Value Date   ALT 15 03/17/2023   AST 14 03/17/2023   ALKPHOS 62 03/17/2023   BILITOT 0.5 03/17/2023   The 10-year ASCVD risk score (Arnett DK, et al., 2019) is: 26.9%   Values used to calculate the score:     Age: 52 years     Sex: Female     Is Non-Hispanic African American: Yes     Diabetic: Yes     Tobacco smoker: No     Systolic Blood Pressure: 148 mmHg     Is BP treated: Yes     HDL Cholesterol: 30 mg/dL     Total Cholesterol: 172 mg/dL  Plan: Continue rosuvastatin and fenofibrate. Continue to work on nutrition plan -decreasing simple carbohydrates, increasing  lean proteins, decreasing saturated fats and cholesterol , avoiding trans fats and exercise as able to promote weight loss, improve lipids and decrease cardiovascular risks. Continue Mounjaro.  Recheck labs in 2-3 months.   Vitamin D Deficiency Vitamin D is at goal of 50.  Most recent vitamin D level was 58.5. She is on  prescription ergocalciferol 50,000 IU weekly. Lab Results  Component Value Date   VD25OH 58.5 03/17/2023   VD25OH 30.0 11/18/2022    Plan: Continue and refill  prescription ergocalciferol 50,000 IU weekly Low vitamin D levels can be associated with adiposity  and may result in leptin resistance and weight gain. Also associated with fatigue. Currently on vitamin D supplementation without any adverse effects.  Recheck vitamin D level in 2-3 months to optimize supplementation/avoid over supplementation.     ATTESTASTION STATEMENTS:  Reviewed by clinician on day of visit: allergies, medications, problem list, medical history, surgical history, family history, social history, and previous encounter notes.   I have personally spent 41 minutes total time today in preparation, patient care, nutritional counseling and documentation for this visit, including the following: review of clinical lab tests; review of medical tests/procedures/services.      Tyquisha Sharps, PA-C

## 2023-05-06 ENCOUNTER — Ambulatory Visit (INDEPENDENT_AMBULATORY_CARE_PROVIDER_SITE_OTHER): Payer: BC Managed Care – PPO | Admitting: Physician Assistant

## 2023-05-06 ENCOUNTER — Other Ambulatory Visit: Payer: Self-pay

## 2023-05-06 ENCOUNTER — Encounter (INDEPENDENT_AMBULATORY_CARE_PROVIDER_SITE_OTHER): Payer: Self-pay | Admitting: Physician Assistant

## 2023-05-06 VITALS — BP 148/85 | HR 69 | Temp 98.3°F | Ht 62.0 in | Wt 284.0 lb

## 2023-05-06 DIAGNOSIS — Z7984 Long term (current) use of oral hypoglycemic drugs: Secondary | ICD-10-CM

## 2023-05-06 DIAGNOSIS — I152 Hypertension secondary to endocrine disorders: Secondary | ICD-10-CM | POA: Diagnosis not present

## 2023-05-06 DIAGNOSIS — E1159 Type 2 diabetes mellitus with other circulatory complications: Secondary | ICD-10-CM | POA: Insufficient documentation

## 2023-05-06 DIAGNOSIS — E785 Hyperlipidemia, unspecified: Secondary | ICD-10-CM

## 2023-05-06 DIAGNOSIS — Z6841 Body Mass Index (BMI) 40.0 and over, adult: Secondary | ICD-10-CM

## 2023-05-06 DIAGNOSIS — Z7985 Long-term (current) use of injectable non-insulin antidiabetic drugs: Secondary | ICD-10-CM

## 2023-05-06 DIAGNOSIS — E1169 Type 2 diabetes mellitus with other specified complication: Secondary | ICD-10-CM | POA: Insufficient documentation

## 2023-05-06 DIAGNOSIS — E559 Vitamin D deficiency, unspecified: Secondary | ICD-10-CM | POA: Diagnosis not present

## 2023-05-06 MED ORDER — TIRZEPATIDE 7.5 MG/0.5ML ~~LOC~~ SOAJ
7.5000 mg | SUBCUTANEOUS | 1 refills | Status: DC
Start: 2023-05-06 — End: 2023-06-04
  Filled 2023-05-06: qty 2, 28d supply, fill #0

## 2023-05-06 MED ORDER — VITAMIN D (ERGOCALCIFEROL) 1.25 MG (50000 UNIT) PO CAPS
50000.0000 [IU] | ORAL_CAPSULE | ORAL | 0 refills | Status: DC
Start: 2023-05-06 — End: 2023-06-04
  Filled 2023-05-06: qty 5, 35d supply, fill #0

## 2023-05-07 ENCOUNTER — Other Ambulatory Visit (HOSPITAL_COMMUNITY): Payer: Self-pay

## 2023-05-07 ENCOUNTER — Other Ambulatory Visit: Payer: Self-pay

## 2023-05-09 ENCOUNTER — Other Ambulatory Visit (HOSPITAL_COMMUNITY): Payer: Self-pay

## 2023-05-13 ENCOUNTER — Other Ambulatory Visit (INDEPENDENT_AMBULATORY_CARE_PROVIDER_SITE_OTHER): Payer: Self-pay | Admitting: Physician Assistant

## 2023-05-13 ENCOUNTER — Other Ambulatory Visit (HOSPITAL_COMMUNITY): Payer: Self-pay

## 2023-05-13 DIAGNOSIS — E1169 Type 2 diabetes mellitus with other specified complication: Secondary | ICD-10-CM

## 2023-05-13 MED ORDER — ONETOUCH DELICA PLUS LANCET33G MISC
1.0000 | Freq: Three times a day (TID) | 0 refills | Status: AC
Start: 2023-05-13 — End: 2023-06-13
  Filled 2023-05-13: qty 100, 30d supply, fill #0

## 2023-05-13 MED ORDER — ACCU-CHEK SOFTCLIX LANCET DEV KIT
1.0000 | PACK | Freq: Three times a day (TID) | 0 refills | Status: AC
Start: 2023-05-13 — End: 2023-06-12
  Filled 2023-05-13: qty 1, 30d supply, fill #0

## 2023-05-13 MED ORDER — BLOOD GLUCOSE TEST VI STRP
1.0000 | ORAL_STRIP | Freq: Three times a day (TID) | 0 refills | Status: AC
Start: 2023-05-13 — End: 2023-06-12
  Filled 2023-05-13: qty 100, 33d supply, fill #0

## 2023-05-13 MED ORDER — ONETOUCH VERIO W/DEVICE KIT
1.0000 | PACK | Freq: Three times a day (TID) | 0 refills | Status: AC
Start: 2023-05-13 — End: ?
  Filled 2023-05-13 – 2023-05-15 (×2): qty 1, 1d supply, fill #0

## 2023-05-14 ENCOUNTER — Other Ambulatory Visit (HOSPITAL_BASED_OUTPATIENT_CLINIC_OR_DEPARTMENT_OTHER): Payer: Self-pay

## 2023-05-14 ENCOUNTER — Other Ambulatory Visit (HOSPITAL_COMMUNITY): Payer: Self-pay

## 2023-05-14 ENCOUNTER — Other Ambulatory Visit: Payer: Self-pay

## 2023-05-15 ENCOUNTER — Other Ambulatory Visit (HOSPITAL_COMMUNITY): Payer: Self-pay

## 2023-05-15 ENCOUNTER — Other Ambulatory Visit: Payer: Self-pay

## 2023-05-16 ENCOUNTER — Other Ambulatory Visit: Payer: Self-pay

## 2023-05-19 ENCOUNTER — Other Ambulatory Visit (HOSPITAL_COMMUNITY): Payer: Self-pay

## 2023-06-04 ENCOUNTER — Ambulatory Visit (INDEPENDENT_AMBULATORY_CARE_PROVIDER_SITE_OTHER): Payer: BC Managed Care – PPO | Admitting: Physician Assistant

## 2023-06-04 ENCOUNTER — Other Ambulatory Visit: Payer: Self-pay

## 2023-06-04 ENCOUNTER — Other Ambulatory Visit (HOSPITAL_COMMUNITY): Payer: Self-pay

## 2023-06-04 ENCOUNTER — Encounter (INDEPENDENT_AMBULATORY_CARE_PROVIDER_SITE_OTHER): Payer: Self-pay | Admitting: Physician Assistant

## 2023-06-04 VITALS — BP 159/82 | HR 68 | Temp 98.2°F | Ht 62.0 in | Wt 281.0 lb

## 2023-06-04 DIAGNOSIS — E785 Hyperlipidemia, unspecified: Secondary | ICD-10-CM

## 2023-06-04 DIAGNOSIS — Z7985 Long-term (current) use of injectable non-insulin antidiabetic drugs: Secondary | ICD-10-CM

## 2023-06-04 DIAGNOSIS — E559 Vitamin D deficiency, unspecified: Secondary | ICD-10-CM

## 2023-06-04 DIAGNOSIS — Z6841 Body Mass Index (BMI) 40.0 and over, adult: Secondary | ICD-10-CM

## 2023-06-04 DIAGNOSIS — E1169 Type 2 diabetes mellitus with other specified complication: Secondary | ICD-10-CM

## 2023-06-04 MED ORDER — TIRZEPATIDE 7.5 MG/0.5ML ~~LOC~~ SOAJ
7.5000 mg | SUBCUTANEOUS | 1 refills | Status: DC
  Filled 2023-06-04: qty 2, 28d supply, fill #0

## 2023-06-04 MED ORDER — VITAMIN D (ERGOCALCIFEROL) 1.25 MG (50000 UNIT) PO CAPS
50000.0000 [IU] | ORAL_CAPSULE | ORAL | 0 refills | Status: DC
Start: 2023-06-04 — End: 2023-09-04
  Filled 2023-06-04: qty 5, 35d supply, fill #0

## 2023-06-04 NOTE — Progress Notes (Addendum)
.smr  Office: [phone]  /  Fax: [phone]  WEIGHT SUMMARY AND BIOMETRICS  Vitals Temp: 98.2 F (36.8 C) BP: (!) 159/82 Pulse Rate: 68 SpO2: 100 %   Anthropometric Measurements Height: 5\' 2"  (1.575 m) Weight: 281 lb (127.5 kg) BMI (Calculated): 51.38 Weight at Last Visit: 284 lb Weight Lost Since Last Visit: 3 lb Starting Weight: 315 lb Total Weight Loss (lbs): 34 lb (15.4 kg)   Body Composition  Body Fat %: 56.8 % Fat Mass (lbs): 160 lbs Muscle Mass (lbs): 115.6 lbs Visceral Fat Rating : 21   Other Clinical Data Fasting: yes Labs: yes Today's Visit #: 7 Starting Date: 11/18/22     HPI  Chief Complaint: OBESITY  Amy is here to discuss her progress with her obesity treatment plan. She is on the the Category 3 Plan and keeping a food journal and adhering to recommended goals of 1500-1600 calories and 100 grams of protein and states she is following her eating plan approximately 80 % of the time. She states she is exercising walking 15 minutes 2 times per week. Discussed the use of AI scribe software for clinical note transcription with the patient, who gave verbal consent to proceed.  History of Present Illness  /   Interval History:  Since last office visit she down 3 lbs.     Amy Cardenas, a 52 year old with a history of obesity, type 2 diabetes, hypertension, and vitamin D deficiency, presents for a follow-up visit regarding her obesity treatment plan. She reports a recent weight loss of three pounds, attributing this to her efforts to adhere to her treatment plan. However, she acknowledges that her adherence has been inconsistent due to a hectic schedule, which includes work, school, and Surveyor, quantity. She notes that she loses more weight and feels better when she prepares her meals in advance, but has been struggling to do so due to time constraints.  She reports that she often goes long periods without eating and then grabs whatever is available,  which is not always the healthiest choice. She also mentions that she has been consuming less vegetables than before. Despite these challenges, she has been able to maintain her muscle mass fairly well.  In addition to her struggles with diet, she also reports a lack of physical activity due to her sedentary job at [employer] [place]. She expresses a desire to incorporate more activity into her day, such as walking during her lunch break.  She also mentions that she has been experiencing some constipation, which she has been trying to manage by drinking more water. She notes that her bowel movements were better when she was taking iron supplements, which she has run out of.  She also reports that she has been sleeping better recently, thanks to her sleep apnea machine and a medication to help her sleep.  Pharmacotherapy: Mounjaro 7.5 mg weekly for Type 2 diabetes.  Denies mass in neck, dysphagia, dyspepsia, persistent hoarseness, abdominal pain, or N/V/Constipation or diarrhea. Has annual eye exam. Mood is stable.  Glipizide 10 mg twice daily for Type 2 diabetes. Reports no side effects.   Sees PCP in October  TREATMENT PLAN FOR OBESITY:  Recommended Dietary Goals  Amy is currently in the action stage of change. As such, her goal is to continue weight management plan. She has agreed to the Category 3 Plan and keeping a food journal and adhering to recommended goals of 1500-1600 calories and 100 grams of protein.  Behavioral Intervention  We  discussed the following Behavioral Modification Strategies today: increasing lean protein intake, decreasing simple carbohydrates , increasing vegetables, increasing lower glycemic fruits, avoiding skipping meals, increasing water intake, emotional eating strategies and understanding the difference between hunger signals and cravings, work on managing stress, creating time for self-care and relaxation measures, continue to practice mindfulness  when eating, and planning for success.  Additional resources provided today: NA  Recommended Physical Activity Goals  Amy has been advised to work up to 150 minutes of moderate intensity aerobic activity a week and strengthening exercises 2-3 times per week for cardiovascular health, weight loss maintenance and preservation of muscle mass.   She has agreed to Continue current level of physical activity  and Think about ways to increase daily physical activity and overcoming barriers to exercise   Pharmacotherapy We discussed various medication options to help Amy with her weight loss efforts and we both agreed to continue Mounjaro 7.5 mg weekly.    Return in about 4 weeks (around 07/02/2023).[name] She was informed of the importance of frequent follow up visits to maximize her success with intensive lifestyle modifications for her multiple health conditions.  PHYSICAL EXAM:  Blood pressure (!) 159/82, pulse 68, temperature 98.2 F (36.8 C), height 5\' 2"  (1.575 m), weight 281 lb (127.5 kg), SpO2 100%. Body mass index is 51.4 kg/m.  General: She is overweight, cooperative, alert, well developed, and in no acute distress. PSYCH: Has normal mood, affect and thought process.   Cardiovascular: BP up a bit today, but rushing into clinic this am. BP 159/82, HR 60's Lungs: Normal breathing effort, no conversational dyspnea.  DIAGNOSTIC DATA REVIEWED:  BMET    Component Value Date/Time   NA 142 03/17/2023 1247   K 4.2 03/17/2023 1247   CL 103 03/17/2023 1247   CO2 22 03/17/2023 1247   GLUCOSE 63 (L) 03/17/2023 1247   GLUCOSE 138 (H) 07/27/2020 1335   BUN 23 03/17/2023 1247   CREATININE 0.98 03/17/2023 1247   CALCIUM 9.6 03/17/2023 1247   GFRNONAA >60 07/27/2020 1335   GFRAA >60 05/01/2020 0839   Lab Results  Component Value Date   HGBA1C 6.4 (H) 03/17/2023   HGBA1C 6.1 06/01/2013   Lab Results  Component Value Date   INSULIN 13.4 11/18/2022   Lab Results  Component Value  Date   TSH 2.400 11/18/2022   CBC    Component Value Date/Time   WBC 6.7 11/18/2022 1113   WBC 7.7 10/11/2019 1020   RBC 4.92 11/18/2022 1113   RBC 4.92 10/11/2019 1020   HGB 10.7 (L) 11/18/2022 1113   HCT 35.3 11/18/2022 1113   PLT 354 11/18/2022 1113   MCV 72 (L) 11/18/2022 1113   MCH 21.7 (L) 11/18/2022 1113   MCH 22.0 (L) 10/11/2019 1020   MCHC 30.3 (L) 11/18/2022 1113   MCHC 29.0 (L) 10/11/2019 1020   RDW 16.0 (H) 11/18/2022 1113   Iron Studies No results found for: "IRON", "TIBC", "FERRITIN", "IRONPCTSAT" Lipid Panel     Component Value Date/Time   CHOL 172 03/17/2023 1247   TRIG 127 03/17/2023 1247   HDL 30 (L) 03/17/2023 1247   CHOLHDL 5.7 (H) 03/17/2023 1247   CHOLHDL 4.9 07/27/2020 1335   VLDL 30 07/27/2020 1335   LDLCALC 119 (H) 03/17/2023 1247   Hepatic Function Panel     Component Value Date/Time   PROT 7.2 03/17/2023 1247   ALBUMIN 4.2 03/17/2023 1247   AST 14 03/17/2023 1247   ALT 15 03/17/2023 1247   ALKPHOS  62 03/17/2023 1247   BILITOT 0.5 03/17/2023 1247      Component Value Date/Time   TSH 2.400 11/18/2022 1113   Nutritional Lab Results  Component Value Date   VD25OH 58.5 03/17/2023   VD25OH 30.0 11/18/2022    ASSOCIATED CONDITIONS ADDRESSED TODAY  ASSESSMENT AND PLAN  Problem List Items Addressed This Visit     Diabetes mellitus (HCC) - Primary   Relevant Medications   tirzepatide (MOUNJARO) 7.5 MG/0.5ML Pen   Vitamin D deficiency   Relevant Medications   Vitamin D, Ergocalciferol, (DRISDOL) 1.25 MG (50000 UNIT) CAPS capsule   BMI 50.0-59.9, adult (HCC) Current BMI 51.9   Relevant Medications   tirzepatide (MOUNJARO) 7.5 MG/0.5ML Pen   Hyperlipidemia associated with type 2 diabetes mellitus (HCC)   Relevant Medications   tirzepatide (MOUNJARO) 7.5 MG/0.5ML Pen   Morbid obesity (HCC) with starting BMI 57 (Chronic)   Relevant Medications   tirzepatide (MOUNJARO) 7.5 MG/0.5ML Pen  Obesity Struggling with meal planning and  preparation due to a busy schedule. Maintaining muscle mass but weight loss has slowed. Discussed the importance of consistent meal planning and preparation, incorporating more fruits and vegetables, and increasing physical activity. -Continue current medication regimen with Mounjaro 7.5 mg weekly for Type 2 diabetes -Consider incorporating high protein frozen meals for convenience. -Encouraged to increase physical activity, such as walking during lunch breaks. -Plan to follow up in 4 weeks to assess progress.  Type 2 Diabetes Blood sugars are well controlled with current regimen. Fasting blood sugars in the 90s and postprandial blood sugars in the 110s-120s. -Continue current medication regimen with Mounjaro 7.5 mg weekly for DM. -Encouraged to continue monitoring blood sugars.  Vitamin D Deficiency Currently out of Vitamin D supplement. -Refill Vitamin D supplement.  Iron Deficiency Currently out of OTC iron supplement and experiencing some constipation. Discussed the importance of hydration and fiber intake for constipation. Resume iron supplement. -Consider use of Miralax for constipation if needed. Plan to recheck labs next month.   General Health Maintenance -Plan to check labs at next visit in October.  ATTESTASTION STATEMENTS:  Reviewed by clinician on day of visit: allergies, medications, problem list, medical history, surgical history, family history, social history, and previous encounter notes.   I have personally spent 30 minutes total time today in preparation, patient care, nutritional counseling and documentation for this visit, including the following: review of clinical lab tests; review of medical tests/procedures/services.      Xyon Lukasik, PA-C

## 2023-06-19 ENCOUNTER — Other Ambulatory Visit: Payer: Self-pay

## 2023-07-03 ENCOUNTER — Other Ambulatory Visit (HOSPITAL_COMMUNITY): Payer: Self-pay

## 2023-07-03 ENCOUNTER — Ambulatory Visit (INDEPENDENT_AMBULATORY_CARE_PROVIDER_SITE_OTHER): Payer: BC Managed Care – PPO | Admitting: Physician Assistant

## 2023-07-03 ENCOUNTER — Encounter (INDEPENDENT_AMBULATORY_CARE_PROVIDER_SITE_OTHER): Payer: Self-pay | Admitting: Physician Assistant

## 2023-07-03 ENCOUNTER — Other Ambulatory Visit: Payer: Self-pay

## 2023-07-03 VITALS — BP 166/106 | HR 69 | Temp 97.9°F | Ht 62.0 in | Wt 279.0 lb

## 2023-07-03 DIAGNOSIS — D500 Iron deficiency anemia secondary to blood loss (chronic): Secondary | ICD-10-CM

## 2023-07-03 DIAGNOSIS — R5383 Other fatigue: Secondary | ICD-10-CM

## 2023-07-03 DIAGNOSIS — D519 Vitamin B12 deficiency anemia, unspecified: Secondary | ICD-10-CM | POA: Diagnosis not present

## 2023-07-03 DIAGNOSIS — R7989 Other specified abnormal findings of blood chemistry: Secondary | ICD-10-CM | POA: Diagnosis not present

## 2023-07-03 DIAGNOSIS — E7849 Other hyperlipidemia: Secondary | ICD-10-CM

## 2023-07-03 DIAGNOSIS — Z7985 Long-term (current) use of injectable non-insulin antidiabetic drugs: Secondary | ICD-10-CM

## 2023-07-03 DIAGNOSIS — E559 Vitamin D deficiency, unspecified: Secondary | ICD-10-CM

## 2023-07-03 DIAGNOSIS — D529 Folate deficiency anemia, unspecified: Secondary | ICD-10-CM | POA: Diagnosis not present

## 2023-07-03 DIAGNOSIS — I152 Hypertension secondary to endocrine disorders: Secondary | ICD-10-CM

## 2023-07-03 DIAGNOSIS — Z794 Long term (current) use of insulin: Secondary | ICD-10-CM | POA: Diagnosis not present

## 2023-07-03 DIAGNOSIS — E1169 Type 2 diabetes mellitus with other specified complication: Secondary | ICD-10-CM

## 2023-07-03 DIAGNOSIS — E1159 Type 2 diabetes mellitus with other circulatory complications: Secondary | ICD-10-CM

## 2023-07-03 DIAGNOSIS — Z6841 Body Mass Index (BMI) 40.0 and over, adult: Secondary | ICD-10-CM

## 2023-07-03 MED ORDER — TIRZEPATIDE 7.5 MG/0.5ML ~~LOC~~ SOAJ
7.5000 mg | SUBCUTANEOUS | 1 refills | Status: DC
  Filled 2023-07-03: qty 2, 28d supply, fill #0
  Filled 2023-08-13: qty 2, 28d supply, fill #1

## 2023-07-03 NOTE — Progress Notes (Signed)
.smr  Office: [phone]  /  Fax: [phone]  WEIGHT SUMMARY AND BIOMETRICS  Vitals Temp: 97.9 F (36.6 C) BP: (!) 166/106 Pulse Rate: 69 SpO2: 99 %   Anthropometric Measurements Height: 5\' 2"  (1.575 m) Weight: 279 lb (126.6 kg) BMI (Calculated): 51.02 Weight at Last Visit: 281lb Weight Lost Since Last Visit: 2lb Weight Gained Since Last Visit: 0 Starting Weight: 315lb Total Weight Loss (lbs): 36 lb (16.3 kg)   Body Composition  Body Fat %: 55.3 % Fat Mass (lbs): 154.2 lbs Muscle Mass (lbs): 118.4 lbs Visceral Fat Rating : 21   Other Clinical Data Fasting: yes Labs: yes Today's Visit #: 9 Starting Date: 11/18/22     HPI  Chief Complaint: OBESITY  Amy Cardenas is here to discuss her progress with her obesity treatment plan. She is on the the Category 3 Plan and keeping a food journal and adhering to recommended goals of 1500-1600 calories and 100 grams of protein and states she is following her eating plan approximately 90 % of the time. She states she is exercising walking few minutes 1-2 times per week.   Interval History:  Since last office visit she is down 2 lbs.   The patient, with a history of obesity, type two diabetes, hypertension, hyperlipidemia, vitamin D deficiency, and iron deficiency anemia, presents for a follow-up visit.  She has been working on meal planning and preparation, and has been more active recently. She has noticed an improvement in her health, including a decrease in wheezing.  She has been taking iron for about a month, and has noticed an improvement in her energy levels.  The patient is also on Mounjaro 7.5 mg weekly and Glucotrol (glipizide) for diabetes, and checks her blood sugars occasionally. The patient's blood pressure was high during the visit, and she has been taking her blood pressure medication at night. The patient's goal is to be medication free.  Fasting labs were obtained today.  She was informed we would discuss her  lab results at her next visit unless there is a critical issue that needs to be addressed sooner. She agreed to keep her next visit at the agreed upon time to discuss these results.   Pharmacotherapy: Mounjaro 7.5 mg weekly for Type 2 diabetes.  Denies mass in neck, dysphagia, dyspepsia, persistent hoarseness, abdominal pain, or N/V/Constipation or diarrhea. Has annual eye exam. Mood is stable.  Glipizide 10 mg twice daily for Type 2 diabetes. Reports no side effects.   TREATMENT PLAN FOR OBESITY: Obesity Patient has lost 36 pounds and increased muscle mass by 3 pounds. She has been focusing on meal planning and increasing protein intake. She has been more active but acknowledges the need for more consistent exercise. -Continue current diet and exercise regimen. -Encourage consistent exercise, even if only 15-30 minutes daily.  Recommended Dietary Goals  Amy Cardenas is currently in the action stage of change. As such, her goal is to continue weight management plan. She has agreed to the Category 3 Plan and keeping a food journal and adhering to recommended goals of 1500-1600 calories and 100 grams of protein.  Behavioral Intervention  We discussed the following Behavioral Modification Strategies today: continue to work on maintaining a reduced calorie state, getting the recommended amount of protein, incorporating whole foods, making healthy choices, staying well hydrated and practicing mindfulness when eating..  Additional resources provided today: NA  Recommended Physical Activity Goals  Amy Cardenas has been advised to work up to 150 minutes of moderate intensity aerobic activity a  week and strengthening exercises 2-3 times per week for cardiovascular health, weight loss maintenance and preservation of muscle mass.   She has agreed to Think about enjoyable ways to increase daily physical activity and overcoming barriers to exercise, Increase physical activity in their day and reduce sedentary time  (increase NEAT)., and Work on scheduling and tracking physical activity.    Pharmacotherapy We discussed various medication options to help Amy Cardenas with her weight loss efforts and we both agreed to continue Mounjaro 7.5 mg weekly for Type 2 diabetes and Glipizide 10 mg BID for Type 2 diabetes. .    Return in about 4 weeks (around 07/31/2023).[name] She was informed of the importance of frequent follow up visits to maximize her success with intensive lifestyle modifications for her multiple health conditions.  PHYSICAL EXAM:  Blood pressure (!) 166/106, pulse 69, temperature 97.9 F (36.6 C), height 5\' 2"  (1.575 m), weight 279 lb (126.6 kg), SpO2 99%. Body mass index is 51.03 kg/m.  General: She is overweight, cooperative, alert, well developed, and in no acute distress. PSYCH: Has normal mood, affect and thought process.   Cardiovascular: HR 60's BP 166/106, initial BP 153/90- Reports rushing to get into clinic .  Lungs: Normal breathing effort, no conversational dyspnea. Neuro: no focal deficits  DIAGNOSTIC DATA REVIEWED:  BMET    Component Value Date/Time   NA 142 03/17/2023 1247   K 4.2 03/17/2023 1247   CL 103 03/17/2023 1247   CO2 22 03/17/2023 1247   GLUCOSE 63 (L) 03/17/2023 1247   GLUCOSE 138 (H) 07/27/2020 1335   BUN 23 03/17/2023 1247   CREATININE 0.98 03/17/2023 1247   CALCIUM 9.6 03/17/2023 1247   GFRNONAA >60 07/27/2020 1335   GFRAA >60 05/01/2020 0839   Lab Results  Component Value Date   HGBA1C 6.4 (H) 03/17/2023   HGBA1C 6.1 06/01/2013   Lab Results  Component Value Date   INSULIN 13.4 11/18/2022   Lab Results  Component Value Date   TSH 2.400 11/18/2022   CBC    Component Value Date/Time   WBC 6.7 11/18/2022 1113   WBC 7.7 10/11/2019 1020   RBC 4.92 11/18/2022 1113   RBC 4.92 10/11/2019 1020   HGB 10.7 (L) 11/18/2022 1113   HCT 35.3 11/18/2022 1113   PLT 354 11/18/2022 1113   MCV 72 (L) 11/18/2022 1113   MCH 21.7 (L) 11/18/2022 1113   MCH 22.0  (L) 10/11/2019 1020   MCHC 30.3 (L) 11/18/2022 1113   MCHC 29.0 (L) 10/11/2019 1020   RDW 16.0 (H) 11/18/2022 1113   Iron Studies No results found for: "IRON", "TIBC", "FERRITIN", "IRONPCTSAT" Lipid Panel     Component Value Date/Time   CHOL 172 03/17/2023 1247   TRIG 127 03/17/2023 1247   HDL 30 (L) 03/17/2023 1247   CHOLHDL 5.7 (H) 03/17/2023 1247   CHOLHDL 4.9 07/27/2020 1335   VLDL 30 07/27/2020 1335   LDLCALC 119 (H) 03/17/2023 1247   Hepatic Function Panel     Component Value Date/Time   PROT 7.2 03/17/2023 1247   ALBUMIN 4.2 03/17/2023 1247   AST 14 03/17/2023 1247   ALT 15 03/17/2023 1247   ALKPHOS 62 03/17/2023 1247   BILITOT 0.5 03/17/2023 1247      Component Value Date/Time   TSH 2.400 11/18/2022 1113   Nutritional Lab Results  Component Value Date   VD25OH 58.5 03/17/2023   VD25OH 30.0 11/18/2022    ASSOCIATED CONDITIONS ADDRESSED TODAY  ASSESSMENT AND PLAN  Problem List  Items Addressed This Visit     Other fatigue   Relevant Orders   Vitamin B12   Diabetes mellitus (HCC) - Primary   Relevant Medications   tirzepatide (MOUNJARO) 7.5 MG/0.5ML Pen   Other Relevant Orders   CMP14+EGFR   Hemoglobin A1c   Insulin, random   Vitamin D deficiency   Relevant Orders   VITAMIN D 25 Hydroxy (Vit-D Deficiency, Fractures)   Iron deficiency anemia   Relevant Orders   Folate   Iron and TIBC   Ferritin   CBC with Differential/Platelet   Other hyperlipidemia   Relevant Orders   Lipid Panel With LDL/HDL Ratio   BMI 50.0-59.9, adult (HCC) Current BMI 51.9   Relevant Medications   tirzepatide (MOUNJARO) 7.5 MG/0.5ML Pen   Hypertension associated with diabetes (HCC)   Relevant Medications   tirzepatide (MOUNJARO) 7.5 MG/0.5ML Pen   Morbid obesity (HCC) with starting BMI 57 (Chronic)   Relevant Medications   tirzepatide (MOUNJARO) 7.5 MG/0.5ML Pen  Type 2 Diabetes Mellitus with other specified complication, without long-term current use of  insulin HgbA1c is at goal. Last A1c was 6.4- much improved  CBGs: Fasting 90-100 Episodes of hypoglycemia: no Medication(s): Mounjaro 7.5 mg SQ weekly  Lab Results  Component Value Date   HGBA1C 6.4 (H) 03/17/2023   HGBA1C 9.9 (H) 11/18/2022   HGBA1C 7.9 (H) 07/27/2020   Lab Results  Component Value Date   MICROALBUR 7.0 (H) 10/11/2019   LDLCALC 119 (H) 03/17/2023   CREATININE 0.98 03/17/2023   No results found for: "GFR"  Plan: Continue and refill Mounjaro 7.5 mg SQ weekly for the past 3 months.  Good improvement over the past month with increased adherence to nutrition plan and is maintaining muscle mass.  Denies mass in neck, dysphagia, dyspepsia, persistent hoarseness, abdominal pain, or N/V/Constipation or diarrhea. Has annual eye exam. Mood is stable.   She is working  on nutrition plan to decrease simple carbohydrates, increase lean proteins and exercise to promote weight loss and improve glycemic control . May want to increase dose over the next month. Recheck fasting labs this visit.   Hypertension Hypertension asymptomatic, control uncertain, needs further observation, and needs improvement.  Medication(s): Tenormin 100 mg daily.   Seen in cardiology clinic 12/2022 and no changes in medication as BP at goal at that time. Recommended:   Continue atenolol 100 mg once daily (probably can be switched to carvedilol in the future). Patient has allergy to lisinopril, losartan and amlodipine with lip swelling and itching. She has a rash to sulfa antibiotics.   Elevated blood pressure noted during visit, possibly due to morning stress. Patient takes antihypertensive medication at night BP Readings from Last 3 Encounters:  07/03/23 (!) 166/106  06/04/23 (!) 159/82  05/06/23 (!) 148/85   Lab Results  Component Value Date   CREATININE 0.98 03/17/2023   CREATININE 0.90 11/18/2022   CREATININE 0.86 07/27/2020   No results found for: "GFR"  Plan: Continue Tenormin at  current dosage. Monitor closely on Mounjaro for improvements in BP.   If not improving, may need to increase/add medication.  Continue to work on nutrition plan to promote weight loss and improve BP control.  Recheck labs today.   Vitamin D Deficiency Vitamin D is at goal of 50.  Most recent vitamin D level was 58.5. She is on  prescription ergocalciferol 50,000 IU weekly. No N/V or muscle weakness with Ergocalciferol.  Lab Results  Component Value Date   VD25OH 58.5 03/17/2023  VD25OH 30.0 11/18/2022    Plan: Continue  prescription ergocalciferol 50,000 IU weekly Low vitamin D levels can be associated with adiposity and may result in leptin resistance and weight gain. Also associated with fatigue. Currently on vitamin D supplementation without any adverse effects.  Will recheck vitamin D levels today and adjust supplementation accordingly. May be able to transition to OTC vitamin D at this time based on results.   Hyperlipidemia LDL is not at goal. Medication(s): Crestor 10 mg daily Cardiovascular risk factors: diabetes mellitus, dyslipidemia, hypertension, obesity (BMI >= 30 kg/m2), and sedentary lifestyle  Lab Results  Component Value Date   CHOL 172 03/17/2023   HDL 30 (L) 03/17/2023   LDLCALC 119 (H) 03/17/2023   TRIG 127 03/17/2023   CHOLHDL 5.7 (H) 03/17/2023   CHOLHDL 6.2 (H) 11/18/2022   CHOLHDL 4.9 07/27/2020   Lab Results  Component Value Date   ALT 15 03/17/2023   AST 14 03/17/2023   ALKPHOS 62 03/17/2023   BILITOT 0.5 03/17/2023   The 10-year ASCVD risk score (Arnett DK, et al., 2019) is: 39.2%   Values used to calculate the score:     Age: 52 years     Sex: Female     Is Non-Hispanic African American: Yes     Diabetic: Yes     Tobacco smoker: No     Systolic Blood Pressure: 166 mmHg     Is BP treated: Yes     HDL Cholesterol: 30 mg/dL     Total Cholesterol: 172 mg/dL  Plan: Continue statin. Continue Mounjaro to reduce CV risks.  Continue to work  on nutrition plan -decreasing simple carbohydrates, increasing lean proteins, decreasing saturated fats and cholesterol , avoiding trans fats and exercise as able to promote weight loss, improve lipids and decrease cardiovascular risks.     Iron deficiency anemia Anemia is stable. Iron supplementation: Iron 325 mg by mouth daily Lab Results  Component Value Date   WBC 6.7 11/18/2022   HGB 10.7 (L) 11/18/2022   HCT 35.3 11/18/2022   MCV 72 (L) 11/18/2022   PLT 354 11/18/2022   No results found for: "IRON", "TIBC", "FERRITIN"   Plan: Continue supplementation at current dose -Check complete blood count and anemia panel today. -Continue iron supplementation.  Other fatigue: Endorses fatigue. Plan: Will also check B 12 level in addition to other labs today.   General Health Maintenance -Order labs today including chemistries, electrolytes, complete blood count, A1c, insulin level, fasting lipids, Vitamin D, Vitamin B, and anemia panel. -Follow-up appointment scheduled for 07/29/2023.  ATTESTASTION STATEMENTS:  Reviewed by clinician on day of visit: allergies, medications, problem list, medical history, surgical history, family history, social history, and previous encounter notes.   I have personally spent 48 minutes total time today in preparation, patient care, nutritional counseling and documentation for this visit, including the following: review of clinical lab tests; review of medical tests/procedures/services.      Ion Gonnella, PA-C

## 2023-07-03 NOTE — Addendum Note (Signed)
Addended by: Teressa Senter on: 07/03/2023 11:13 AM   Modules accepted: Orders

## 2023-07-04 LAB — CBC WITH DIFFERENTIAL/PLATELET
Basophils Absolute: 0.1 10*3/uL (ref 0.0–0.2)
Basos: 1 %
EOS (ABSOLUTE): 0.2 10*3/uL (ref 0.0–0.4)
Eos: 3 %
Hematocrit: 36.2 % (ref 34.0–46.6)
Hemoglobin: 10.8 g/dL — ABNORMAL LOW (ref 11.1–15.9)
Immature Grans (Abs): 0 10*3/uL (ref 0.0–0.1)
Immature Granulocytes: 0 %
Lymphocytes Absolute: 1.9 10*3/uL (ref 0.7–3.1)
Lymphs: 28 %
MCH: 22 pg — ABNORMAL LOW (ref 26.6–33.0)
MCHC: 29.8 g/dL — ABNORMAL LOW (ref 31.5–35.7)
MCV: 74 fL — ABNORMAL LOW (ref 79–97)
Monocytes Absolute: 0.5 10*3/uL (ref 0.1–0.9)
Monocytes: 7 %
Neutrophils Absolute: 4.2 10*3/uL (ref 1.4–7.0)
Neutrophils: 61 %
Platelets: 371 10*3/uL (ref 150–450)
RBC: 4.91 x10E6/uL (ref 3.77–5.28)
RDW: 16.9 % — ABNORMAL HIGH (ref 11.7–15.4)
WBC: 6.8 10*3/uL (ref 3.4–10.8)

## 2023-07-04 LAB — LIPID PANEL WITH LDL/HDL RATIO
Cholesterol, Total: 132 mg/dL (ref 100–199)
HDL: 30 mg/dL — ABNORMAL LOW (ref >39)
LDL Chol Calc (NIH): 74 mg/dL (ref 0–99)
LDL/HDL Ratio: 2.5 {ratio} (ref 0.0–3.2)
Triglycerides: 164 mg/dL — ABNORMAL HIGH (ref 0–149)
VLDL Cholesterol Cal: 28 mg/dL (ref 5–40)

## 2023-07-04 LAB — INSULIN, RANDOM: INSULIN: 11.3 u[IU]/mL (ref 2.6–24.9)

## 2023-07-04 LAB — IRON AND TIBC
Iron Saturation: 10 % — ABNORMAL LOW (ref 15–55)
Iron: 30 ug/dL (ref 27–159)
Total Iron Binding Capacity: 315 ug/dL (ref 250–450)
UIBC: 285 ug/dL (ref 131–425)

## 2023-07-04 LAB — CMP14+EGFR
ALT: 14 [IU]/L (ref 0–32)
AST: 15 [IU]/L (ref 0–40)
Albumin: 4.3 g/dL (ref 3.8–4.9)
Alkaline Phosphatase: 72 [IU]/L (ref 44–121)
BUN/Creatinine Ratio: 30 — ABNORMAL HIGH (ref 9–23)
BUN: 30 mg/dL — ABNORMAL HIGH (ref 6–24)
Bilirubin Total: 0.5 mg/dL (ref 0.0–1.2)
CO2: 23 mmol/L (ref 20–29)
Calcium: 9.6 mg/dL (ref 8.7–10.2)
Chloride: 105 mmol/L (ref 96–106)
Creatinine, Ser: 0.99 mg/dL (ref 0.57–1.00)
Globulin, Total: 2.7 g/dL (ref 1.5–4.5)
Glucose: 90 mg/dL (ref 70–99)
Potassium: 4.6 mmol/L (ref 3.5–5.2)
Sodium: 143 mmol/L (ref 134–144)
Total Protein: 7 g/dL (ref 6.0–8.5)
eGFR: 69 mL/min/{1.73_m2} (ref >59)

## 2023-07-04 LAB — FERRITIN: Ferritin: 61 ng/mL (ref 15–150)

## 2023-07-04 LAB — VITAMIN D 25 HYDROXY (VIT D DEFICIENCY, FRACTURES): Vit D, 25-Hydroxy: 61.2 ng/mL (ref 30.0–100.0)

## 2023-07-04 LAB — HEMOGLOBIN A1C
Est. average glucose Bld gHb Est-mCnc: 128 mg/dL
Hgb A1c MFr Bld: 6.1 % — ABNORMAL HIGH (ref 4.8–5.6)

## 2023-07-04 LAB — FOLATE: Folate: 10.3 ng/mL (ref >3.0)

## 2023-07-04 LAB — VITAMIN B12: Vitamin B-12: 640 pg/mL (ref 232–1245)

## 2023-07-09 ENCOUNTER — Other Ambulatory Visit (HOSPITAL_COMMUNITY): Payer: Self-pay

## 2023-07-09 DIAGNOSIS — I10 Essential (primary) hypertension: Secondary | ICD-10-CM | POA: Diagnosis not present

## 2023-07-09 DIAGNOSIS — E782 Mixed hyperlipidemia: Secondary | ICD-10-CM | POA: Diagnosis not present

## 2023-07-16 ENCOUNTER — Other Ambulatory Visit: Payer: Self-pay

## 2023-07-16 ENCOUNTER — Other Ambulatory Visit (HOSPITAL_COMMUNITY): Payer: Self-pay

## 2023-07-16 DIAGNOSIS — E7841 Elevated Lipoprotein(a): Secondary | ICD-10-CM | POA: Diagnosis not present

## 2023-07-16 DIAGNOSIS — E782 Mixed hyperlipidemia: Secondary | ICD-10-CM | POA: Diagnosis not present

## 2023-07-16 DIAGNOSIS — I10 Essential (primary) hypertension: Secondary | ICD-10-CM | POA: Diagnosis not present

## 2023-07-16 DIAGNOSIS — Z23 Encounter for immunization: Secondary | ICD-10-CM | POA: Diagnosis not present

## 2023-07-16 MED ORDER — ROSUVASTATIN CALCIUM 20 MG PO TABS
20.0000 mg | ORAL_TABLET | Freq: Every day | ORAL | 2 refills | Status: DC
  Filled 2023-07-16: qty 90, 90d supply, fill #0
  Filled 2023-10-09: qty 90, 90d supply, fill #1
  Filled 2024-01-05: qty 90, 90d supply, fill #2

## 2023-07-29 ENCOUNTER — Ambulatory Visit (INDEPENDENT_AMBULATORY_CARE_PROVIDER_SITE_OTHER): Payer: BC Managed Care – PPO | Admitting: Physician Assistant

## 2023-07-29 NOTE — Progress Notes (Deleted)
.smr  Office: [phone]  /  Fax: [phone]  WEIGHT SUMMARY AND BIOMETRICS  No data recorded No data recorded No data recorded No data recorded   HPI  Chief Complaint: OBESITY  Amy Cardenas is here to discuss her progress with her obesity treatment plan. She is on the {MWMwtlossportion/plan2:23431} and states she is following her eating plan approximately *** % of the time. She states she is exercising *** minutes *** times per week.   Interval History:  Since last office visit she *** Hunger/appetite-*** Cravings- *** Stress- *** Sleep- *** Exercise-*** Hydration-***   Pharmacotherapy: ***  TREATMENT PLAN FOR OBESITY:  Recommended Dietary Goals  Amy Cardenas is currently in the action stage of change. As such, her goal is to continue weight management plan. She has agreed to {MWMwtlossportion/plan2:23431}.  Behavioral Intervention  We discussed the following Behavioral Modification Strategies today: {EMWMwtlossstrategies:28914::"continue to work on maintaining a reduced calorie state, getting the recommended amount of protein, incorporating whole foods, making healthy choices, staying well hydrated and practicing mindfulness when eating."}.  Additional resources provided today: NA  Recommended Physical Activity Goals  Amy Cardenas has been advised to work up to 150 minutes of moderate intensity aerobic activity a week and strengthening exercises 2-3 times per week for cardiovascular health, weight loss maintenance and preservation of muscle mass.   She has agreed to {EMEXERCISE:28847::"Think about enjoyable ways to increase daily physical activity and overcoming barriers to exercise","Increase physical activity in their day and reduce sedentary time (increase NEAT)."}   Pharmacotherapy We discussed various medication options to help Amy Cardenas with her weight loss efforts and we both agreed to ***.    No follow-ups on file.[name] She was informed of the importance of frequent follow up  visits to maximize her success with intensive lifestyle modifications for her multiple health conditions.  PHYSICAL EXAM:  There were no vitals taken for this visit. There is no height or weight on file to calculate BMI.  General: She is overweight, cooperative, alert, well developed, and in no acute distress. PSYCH: Has normal mood, affect and thought process.   Lungs: Normal breathing effort, no conversational dyspnea.  DIAGNOSTIC DATA REVIEWED:  BMET    Component Value Date/Time   NA 143 07/03/2023 0746   K 4.6 07/03/2023 0746   CL 105 07/03/2023 0746   CO2 23 07/03/2023 0746   GLUCOSE 90 07/03/2023 0746   GLUCOSE 138 (H) 07/27/2020 1335   BUN 30 (H) 07/03/2023 0746   CREATININE 0.99 07/03/2023 0746   CALCIUM 9.6 07/03/2023 0746   GFRNONAA >60 07/27/2020 1335   GFRAA >60 05/01/2020 0839   Lab Results  Component Value Date   HGBA1C 6.1 (H) 07/03/2023   HGBA1C 6.1 06/01/2013   Lab Results  Component Value Date   INSULIN 11.3 07/03/2023   INSULIN 13.4 11/18/2022   Lab Results  Component Value Date   TSH 2.400 11/18/2022   CBC    Component Value Date/Time   WBC 6.8 07/03/2023 0746   WBC 7.7 10/11/2019 1020   RBC 4.91 07/03/2023 0746   RBC 4.92 10/11/2019 1020   HGB 10.8 (L) 07/03/2023 0746   HCT 36.2 07/03/2023 0746   PLT 371 07/03/2023 0746   MCV 74 (L) 07/03/2023 0746   MCH 22.0 (L) 07/03/2023 0746   MCH 22.0 (L) 10/11/2019 1020   MCHC 29.8 (L) 07/03/2023 0746   MCHC 29.0 (L) 10/11/2019 1020   RDW 16.9 (H) 07/03/2023 0746   Iron Studies    Component Value Date/Time   IRON 30  07/03/2023 0746   TIBC 315 07/03/2023 0746   FERRITIN 61 07/03/2023 0746   IRONPCTSAT 10 (L) 07/03/2023 0746   Lipid Panel     Component Value Date/Time   CHOL 132 07/03/2023 0746   TRIG 164 (H) 07/03/2023 0746   HDL 30 (L) 07/03/2023 0746   CHOLHDL 5.7 (H) 03/17/2023 1247   CHOLHDL 4.9 07/27/2020 1335   VLDL 30 07/27/2020 1335   LDLCALC 74 07/03/2023 0746   Hepatic  Function Panel     Component Value Date/Time   PROT 7.0 07/03/2023 0746   ALBUMIN 4.3 07/03/2023 0746   AST 15 07/03/2023 0746   ALT 14 07/03/2023 0746   ALKPHOS 72 07/03/2023 0746   BILITOT 0.5 07/03/2023 0746      Component Value Date/Time   TSH 2.400 11/18/2022 1113   Nutritional Lab Results  Component Value Date   VD25OH 61.2 07/03/2023   VD25OH 58.5 03/17/2023   VD25OH 30.0 11/18/2022    ASSOCIATED CONDITIONS ADDRESSED TODAY  ASSESSMENT AND PLAN  Problem List Items Addressed This Visit     Diabetes mellitus (HCC) - Primary   Vitamin D deficiency   Iron deficiency anemia   Hypertension associated with diabetes (HCC)   Morbid obesity (HCC) with starting BMI 57 (Chronic)    ATTESTASTION STATEMENTS:  Reviewed by clinician on day of visit: allergies, medications, problem list, medical history, surgical history, family history, social history, and previous encounter notes.   I have personally spent 30 minutes total time today in preparation, patient care, nutritional counseling and documentation for this visit, including the following: review of clinical lab tests; review of medical tests/procedures/services.      Shaden Higley, PA-C

## 2023-08-05 ENCOUNTER — Encounter (INDEPENDENT_AMBULATORY_CARE_PROVIDER_SITE_OTHER): Payer: Self-pay | Admitting: Internal Medicine

## 2023-08-05 ENCOUNTER — Ambulatory Visit (INDEPENDENT_AMBULATORY_CARE_PROVIDER_SITE_OTHER): Payer: BC Managed Care – PPO | Admitting: Internal Medicine

## 2023-08-05 ENCOUNTER — Other Ambulatory Visit (HOSPITAL_COMMUNITY): Payer: Self-pay

## 2023-08-05 VITALS — BP 157/94 | Temp 98.3°F | Ht 62.0 in | Wt 283.0 lb

## 2023-08-05 DIAGNOSIS — E1159 Type 2 diabetes mellitus with other circulatory complications: Secondary | ICD-10-CM | POA: Diagnosis not present

## 2023-08-05 DIAGNOSIS — E1169 Type 2 diabetes mellitus with other specified complication: Secondary | ICD-10-CM

## 2023-08-05 DIAGNOSIS — I10 Essential (primary) hypertension: Secondary | ICD-10-CM | POA: Diagnosis not present

## 2023-08-05 DIAGNOSIS — E669 Obesity, unspecified: Secondary | ICD-10-CM

## 2023-08-05 DIAGNOSIS — Z7984 Long term (current) use of oral hypoglycemic drugs: Secondary | ICD-10-CM

## 2023-08-05 DIAGNOSIS — Z6841 Body Mass Index (BMI) 40.0 and over, adult: Secondary | ICD-10-CM

## 2023-08-05 MED ORDER — METFORMIN HCL ER 500 MG PO TB24
500.0000 mg | ORAL_TABLET | Freq: Two times a day (BID) | ORAL | 0 refills | Status: DC
  Filled 2023-08-05: qty 60, 30d supply, fill #0

## 2023-08-05 NOTE — Progress Notes (Unsigned)
Office: [phone]  /  Fax: [phone]  Weight Summary And Biometrics  Vitals Temp: 98.3 F (36.8 C) BP: (!) 154/91   Anthropometric Measurements Height: 5\' 2"  (1.575 m) Weight: 283 lb (128.4 kg) BMI (Calculated): 51.75 Weight at Last Visit: 279 lb Weight Lost Since Last Visit: 0 lb Weight Gained Since Last Visit: 4 lb Starting Weight: 315 lb Total Weight Loss (lbs): 32 lb (14.5 kg)   Body Composition  Body Fat %: 56.1 % Fat Mass (lbs): 159 lbs Muscle Mass (lbs): 118.2 lbs Total Body Water (lbs): 0 lbs Visceral Fat Rating : 21    No data recorded Today's Visit #: 10  Starting Date: 11/18/22   Subjective   Chief Complaint: Obesity  Amy Cardenas is here to discuss her progress with her obesity treatment plan. She is on the the Category 2 Plan and states she is following her eating plan approximately 85 % of the time. She states she is exercising 15 minutes 1 times per week.  Interval History:   Discussed the use of AI scribe software for clinical note transcription with the patient, who gave verbal consent to proceed.  History of Present Illness   This is my first encounter with patient.  She is a 52 year old female with a history of obesity, type 2 diabetes, and hypertension, presents for a follow-up on weight management. She reports a recent weight gain of four pounds, which she attributes to a decrease in physical activity and dietary consistency due to a busy schedule and dietary fatigue. She expresses difficulty in maintaining a varied diet and has been resorting to convenience foods more frequently.  The patient has been participating in our weight management program since February and initially experienced significant weight loss. However, she has recently struggled with maintaining the dietary and physical activity regimen due to a perceived monotony in her diet and increased life demands. She acknowledges a shift in her dietary habits, with a decrease in  protein intake and an increase in carbohydrate consumption, which she understands contributes to her weight gain and fatigue.  The patient's blood pressure readings have been consistently high, raising concerns about her hypertension management. She is aware of the need for more aggressive blood pressure monitoring and is willing to advocate for changes in her medication regimen to better manage her hypertension and weight.       Barriers identified: lack of time for self-care, multiple competing priorities, medical comorbidities, presence of obesogenic drugs, and dieting fatigue .  Cost of medication  Pharmacotherapy for weight loss: She is currently taking no anti-obesity medication.   Assessment and Plan   Treatment Plan For Obesity:  Recommended Dietary Goals  Amy Cardenas is currently in the action stage of change. As such, her goal is to continue weight management plan. She has agreed to: continue current plan  Behavioral Intervention  We discussed the following Behavioral Modification Strategies today: continue to work on maintaining a reduced calorie state, getting the recommended amount of protein, incorporating whole foods, making healthy choices, staying well hydrated and practicing mindfulness when eating..  Additional resources provided today: None  Recommended Physical Activity Goals  Amy Cardenas has been advised to work up to 150 minutes of moderate intensity aerobic activity a week and strengthening exercises 2-3 times per week for cardiovascular health, weight loss maintenance and preservation of muscle mass.   She has agreed to :  Think about enjoyable ways to increase daily physical activity and overcoming barriers to exercise and Increase physical activity in their  day and reduce sedentary time (increase NEAT).  Pharmacotherapy  We discussed various medication options to help Amy Cardenas with her weight loss efforts and we both agreed to : continue with nutritional and behavioral  strategies  Associated Conditions Addressed Today  Assessment and Plan    Obesity   Patient had been doing well had lost 32 pounds since starting our program but recently has gained 4 pounds since the last visit, highlighting challenges with diet and physical activity.  She reports some dieting fatigue and loosening of her diet.  We discussed the importance of a balanced plate with high protein and low carbs, keeping menu planning simple.  She may need to do a combination of meal prep, healthy prepackaged options, using meal replacements and making healthier choices when eating out.  She acknowledged obesity as a relapsing condition requiring lifelong management. We will encourage meal planning and exploring new recipes to combat diet fatigue. She should consider using AI tools like ChatGPT for making healthier food choices when eating out.  Patient is also on obesogenic medications including atenolol and glipizide.  She will discuss use of beta-blocker with prescribing physician.  Her blood pressure is not well-controlled.  She could potentially be switched to a weight neutral beta-blocker like carvedilol or Bystolic.  We are discontinuing glipizide due to increased risk of hypoglycemia with GLP-1 therapy.  She is willing to try metformin.  Type 2 Diabetes Mellitus   Her diabetes management is complicated by weight gain and dietary inconsistencies. She is currently on glipizide, which may contribute to weight gain and hypoglycemia. We discussed switching to metformin, which works well with Greggory Keen, does not cause weight gain, helps reduce internal fat, improves cholesterol, and aids in appetite control without causing hypoglycemia. We will discontinue glipizide and start metformin 500 mg XR, one tablet with breakfast for one week, then increase to two tablets daily if tolerated. She must monitor blood sugar levels and report any significant changes.  Hypertension   Her blood pressure is not well  controlled, with multiple high readings. She is currently on atenolol monotherapy, which can cause weight gain and depression. We discussed the need for better blood pressure management and potential medication changes, explaining that consistent hypertension increases the risk of stroke and affects kidney and heart health. She should monitor blood pressure twice daily, in the morning and before bedtime, report consistent high readings to her primary care physician, and discuss switching atenolol to carvedilol or Bystolic.  Considering degree of blood pressure elevation she would likely need 2-3 agents.  She may benefit from addition of a diuretic plus minus calcium channel blocker.  She will follow-up with her primary care team.  General Health Maintenance   We emphasized the importance of maintaining a balanced diet, regular physical activity, and adequate sleep for lifelong management of obesity and diabetes. We encourage regular physical activity, promote adequate sleep and stress management, and advise on meal planning and healthier food choices.  Follow-up   A follow-up appointment with the dietitian is scheduled in 4 weeks. She should monitor blood pressure and blood sugar levels and communicate with her primary care physician regarding medication changes and blood pressure management.             Objective   Physical Exam:  Blood pressure (!) 154/91, temperature 98.3 F (36.8 C), height 5\' 2"  (1.575 m), weight 283 lb (128.4 kg), last menstrual period 06/26/2023. Body mass index is 51.76 kg/m.  General: She is overweight, cooperative, alert, well developed,  and in no acute distress. PSYCH: Has normal mood, affect and thought process.   HEENT: EOMI, sclerae are anicteric. Lungs: Normal breathing effort, no conversational dyspnea. Extremities: No edema.  Neurologic: No gross sensory or motor deficits. No tremors or fasciculations noted.    Diagnostic Data Reviewed:  BMET     Component Value Date/Time   NA 143 07/03/2023 0746   K 4.6 07/03/2023 0746   CL 105 07/03/2023 0746   CO2 23 07/03/2023 0746   GLUCOSE 90 07/03/2023 0746   GLUCOSE 138 (H) 07/27/2020 1335   BUN 30 (H) 07/03/2023 0746   CREATININE 0.99 07/03/2023 0746   CALCIUM 9.6 07/03/2023 0746   GFRNONAA >60 07/27/2020 1335   GFRAA >60 05/01/2020 0839   Lab Results  Component Value Date   HGBA1C 6.1 (H) 07/03/2023   HGBA1C 6.1 06/01/2013   Lab Results  Component Value Date   INSULIN 11.3 07/03/2023   INSULIN 13.4 11/18/2022   Lab Results  Component Value Date   TSH 2.400 11/18/2022   CBC    Component Value Date/Time   WBC 6.8 07/03/2023 0746   WBC 7.7 10/11/2019 1020   RBC 4.91 07/03/2023 0746   RBC 4.92 10/11/2019 1020   HGB 10.8 (L) 07/03/2023 0746   HCT 36.2 07/03/2023 0746   PLT 371 07/03/2023 0746   MCV 74 (L) 07/03/2023 0746   MCH 22.0 (L) 07/03/2023 0746   MCH 22.0 (L) 10/11/2019 1020   MCHC 29.8 (L) 07/03/2023 0746   MCHC 29.0 (L) 10/11/2019 1020   RDW 16.9 (H) 07/03/2023 0746   Iron Studies    Component Value Date/Time   IRON 30 07/03/2023 0746   TIBC 315 07/03/2023 0746   FERRITIN 61 07/03/2023 0746   IRONPCTSAT 10 (L) 07/03/2023 0746   Lipid Panel     Component Value Date/Time   CHOL 132 07/03/2023 0746   TRIG 164 (H) 07/03/2023 0746   HDL 30 (L) 07/03/2023 0746   CHOLHDL 5.7 (H) 03/17/2023 1247   CHOLHDL 4.9 07/27/2020 1335   VLDL 30 07/27/2020 1335   LDLCALC 74 07/03/2023 0746   Hepatic Function Panel     Component Value Date/Time   PROT 7.0 07/03/2023 0746   ALBUMIN 4.3 07/03/2023 0746   AST 15 07/03/2023 0746   ALT 14 07/03/2023 0746   ALKPHOS 72 07/03/2023 0746   BILITOT 0.5 07/03/2023 0746      Component Value Date/Time   TSH 2.400 11/18/2022 1113   Nutritional Lab Results  Component Value Date   VD25OH 61.2 07/03/2023   VD25OH 58.5 03/17/2023   VD25OH 30.0 11/18/2022    Follow-Up   No follow-ups on file.[name] She was informed of  the importance of frequent follow up visits to maximize her success with intensive lifestyle modifications for her multiple health conditions.  Attestation Statement   Reviewed by clinician on day of visit: allergies, medications, problem list, medical history, surgical history, family history, social history, and previous encounter notes.   I have spent 40 minutes in the care of the patient today including: preparing to see patient (e.g. review and interpretation of tests, old notes ), obtaining and/or reviewing separately obtained history, performing a medically appropriate examination or evaluation, counseling and educating the patient, ordering medications, test or procedures, documenting clinical information in the electronic or other health care record, and independently interpreting results and communicating results to the patient, family, or caregiver   Worthy Rancher, MD

## 2023-08-06 ENCOUNTER — Other Ambulatory Visit (HOSPITAL_COMMUNITY): Payer: Self-pay

## 2023-08-06 ENCOUNTER — Other Ambulatory Visit: Payer: Self-pay

## 2023-08-13 ENCOUNTER — Other Ambulatory Visit (HOSPITAL_COMMUNITY): Payer: Self-pay

## 2023-08-14 ENCOUNTER — Other Ambulatory Visit (HOSPITAL_COMMUNITY): Payer: Self-pay

## 2023-08-14 ENCOUNTER — Other Ambulatory Visit: Payer: Self-pay

## 2023-08-15 ENCOUNTER — Other Ambulatory Visit: Payer: Self-pay

## 2023-08-15 ENCOUNTER — Other Ambulatory Visit (HOSPITAL_COMMUNITY): Payer: Self-pay

## 2023-08-15 MED ORDER — HYDROXYZINE PAMOATE 25 MG PO CAPS
25.0000 mg | ORAL_CAPSULE | Freq: Every evening | ORAL | 1 refills | Status: DC | PRN
  Filled 2023-08-15: qty 30, 30d supply, fill #0
  Filled 2023-10-09: qty 30, 30d supply, fill #1

## 2023-08-22 ENCOUNTER — Other Ambulatory Visit (HOSPITAL_COMMUNITY): Payer: Self-pay

## 2023-09-04 ENCOUNTER — Ambulatory Visit (INDEPENDENT_AMBULATORY_CARE_PROVIDER_SITE_OTHER): Payer: BC Managed Care – PPO | Admitting: Physician Assistant

## 2023-09-04 ENCOUNTER — Other Ambulatory Visit: Payer: Self-pay

## 2023-09-04 ENCOUNTER — Encounter (INDEPENDENT_AMBULATORY_CARE_PROVIDER_SITE_OTHER): Payer: Self-pay | Admitting: Physician Assistant

## 2023-09-04 ENCOUNTER — Other Ambulatory Visit (HOSPITAL_COMMUNITY): Payer: Self-pay

## 2023-09-04 VITALS — BP 144/93 | HR 68 | Temp 97.8°F | Ht 62.0 in | Wt 276.0 lb

## 2023-09-04 DIAGNOSIS — E559 Vitamin D deficiency, unspecified: Secondary | ICD-10-CM | POA: Diagnosis not present

## 2023-09-04 DIAGNOSIS — E1159 Type 2 diabetes mellitus with other circulatory complications: Secondary | ICD-10-CM

## 2023-09-04 DIAGNOSIS — Z7984 Long term (current) use of oral hypoglycemic drugs: Secondary | ICD-10-CM

## 2023-09-04 DIAGNOSIS — I152 Hypertension secondary to endocrine disorders: Secondary | ICD-10-CM | POA: Diagnosis not present

## 2023-09-04 DIAGNOSIS — Z7985 Long-term (current) use of injectable non-insulin antidiabetic drugs: Secondary | ICD-10-CM

## 2023-09-04 DIAGNOSIS — Z6841 Body Mass Index (BMI) 40.0 and over, adult: Secondary | ICD-10-CM

## 2023-09-04 DIAGNOSIS — E1169 Type 2 diabetes mellitus with other specified complication: Secondary | ICD-10-CM

## 2023-09-04 MED ORDER — TIRZEPATIDE 7.5 MG/0.5ML ~~LOC~~ SOAJ
7.5000 mg | SUBCUTANEOUS | 1 refills | Status: DC
  Filled 2023-09-04: qty 2, 28d supply, fill #0
  Filled 2023-10-09: qty 2, 28d supply, fill #1

## 2023-09-04 MED ORDER — VITAMIN D (ERGOCALCIFEROL) 1.25 MG (50000 UNIT) PO CAPS
50000.0000 [IU] | ORAL_CAPSULE | ORAL | 0 refills | Status: DC
  Filled 2023-09-04: qty 5, 35d supply, fill #0

## 2023-09-04 MED ORDER — ATENOLOL 25 MG PO TABS
25.0000 mg | ORAL_TABLET | Freq: Two times a day (BID) | ORAL | 1 refills | Status: DC
  Filled 2023-09-04: qty 60, 30d supply, fill #0
  Filled 2023-10-09: qty 60, 30d supply, fill #1

## 2023-09-04 MED ORDER — METFORMIN HCL ER 500 MG PO TB24
500.0000 mg | ORAL_TABLET | Freq: Two times a day (BID) | ORAL | 0 refills | Status: DC
  Filled 2023-09-04: qty 60, 30d supply, fill #0

## 2023-09-04 MED ORDER — ATENOLOL 25 MG PO TABS
25.0000 mg | ORAL_TABLET | Freq: Two times a day (BID) | ORAL | 2 refills | Status: DC
  Filled 2023-09-04: qty 30, 15d supply, fill #0

## 2023-09-04 MED ORDER — CARVEDILOL 6.25 MG PO TABS
6.2500 mg | ORAL_TABLET | Freq: Two times a day (BID) | ORAL | 3 refills | Status: DC
  Filled 2023-09-04: qty 60, 30d supply, fill #0
  Filled 2023-10-09: qty 60, 30d supply, fill #1

## 2023-09-04 NOTE — Progress Notes (Signed)
SUBJECTIVE:  Chief Complaint: Obesity  Interim History: She is down 7 lbs since last visit.  Down 39 lbs overall TBW loss of 12.4% Amy Cardenas, a 52 year old individual with a history of type 2 diabetes, hypertension, and hyperlipidemia, presents for a follow-up regarding her obesity treatment plan. She has been on Mounjaro 12.5 mg weekly, Crestor 20 mg daily, and Metformin 500 mg BID. She reports adherence to her diet with occasional indulgences, such as birthday cake, but has been able to get back on track quickly. She expresses a desire for increased physical activity but struggles with incorporating it into her routine.  She reports a recent increase in blood pressure and is currently on Atenolol monotherapy. She has been informed that Atenolol can cause weight gain and has discussed alternative options with another physician, including Carvedilol and Bystolic.  She also reports constipation, which she attributes to her iron pill. Despite these challenges, she reports feeling much better than when she first started her obesity treatment plan and has noticed that adhering to her diet makes her feel better. She expresses a desire to continue losing weight and improving her health.  Amy is here to discuss her progress with her obesity treatment plan. She is on the Category 2 Plan and states she is following her eating plan approximately 90 % of the time. She states she is not exercising 0 minutes 0 times per week.   OBJECTIVE: Visit Diagnoses: Problem List Items Addressed This Visit     Diabetes mellitus (HCC) - Primary   Relevant Medications   metFORMIN (GLUCOPHAGE-XR) 500 MG 24 hr tablet   tirzepatide (MOUNJARO) 7.5 MG/0.5ML Pen   Vitamin D deficiency   Relevant Medications   Vitamin D, Ergocalciferol, (DRISDOL) 1.25 MG (50000 UNIT) CAPS capsule   BMI 50.0-59.9, adult (HCC) Current BMI 51.9   Relevant Medications   metFORMIN (GLUCOPHAGE-XR) 500 MG 24 hr tablet   tirzepatide  (MOUNJARO) 7.5 MG/0.5ML Pen   Hypertension associated with diabetes (HCC)   Relevant Medications   metFORMIN (GLUCOPHAGE-XR) 500 MG 24 hr tablet   tirzepatide (MOUNJARO) 7.5 MG/0.5ML Pen   carvedilol (COREG) 6.25 MG tablet   atenolol (TENORMIN) 25 MG tablet   Morbid obesity (HCC) with starting BMI 57 (Chronic)   Relevant Medications   metFORMIN (GLUCOPHAGE-XR) 500 MG 24 hr tablet   tirzepatide (MOUNJARO) 7.5 MG/0.5ML Pen  Obesity 52 year old with obesity, on Mounjaro 7.5 mg weekly, adherent to diet with occasional indulgences. Continued weight loss anticipated, potentially reducing antihypertensive needs. Discussed increasing Mounjaro dose if weight loss plateaus. Emphasized maintaining muscle mass and losing adipose tissue. - Continue Mounjaro 12.5 mg weekly - Encourage adherence to diet and exercise plan - Discuss potential increase in Mounjaro dose if needed - Provide additional protein choices sheet  Hypertension Hypertension remains elevated despite atenolol monotherapy, which may contribute to weight gain. Discussed transitioning to carvedilol while tapering atenolol to better manage blood pressure and support weight loss. Emphasized home blood pressure monitoring to assess efficacy. Will taper atenolol to avoid rebound while starting carvedilol.  - Reduce atenolol to 25 mg BID and plan to taper off over the next couple of months if doing well on carvedilol.  - Initiate carvedilol 6.25 mg BID and have her monitor BP at home and plan taper off Atenolol and potentially increase carvedilol if needed or consider addition of hydrochlorothiazide or amlodipine.   - Monitor blood pressure at home - Follow up in 5 weeks to reassess blood pressure and medication efficacy  Type 2  Diabetes Mellitus Lab Results  Component Value Date   HGBA1C 6.1 (H) 07/03/2023   HGBA1C 6.4 (H) 03/17/2023   HGBA1C 9.9 (H) 11/18/2022   Lab Results  Component Value Date   MICROALBUR 7.0 (H) 10/11/2019    LDLCALC 74 07/03/2023   CREATININE 0.99 07/03/2023    Type 2 diabetes managed with metformin 500 mg BID and Mounjaro 7.5 mg weekly. No changes discussed. - Continue/refill metformin 500 mg BID She is working  on nutrition plan to decrease simple carbohydrates, increase lean proteins and exercise to promote weight loss and improve glycemic control .  Hyperlipidemia The 10-year ASCVD risk score (Arnett DK, et al., 2019) is: 19.8%   Values used to calculate the score:     Age: 34 years     Sex: Female     Is Non-Hispanic African American: Yes     Diabetic: Yes     Tobacco smoker: No     Systolic Blood Pressure: 144 mmHg     Is BP treated: Yes     HDL Cholesterol: 30 mg/dL     Total Cholesterol: 132 mg/dL  Hyperlipidemia managed with Crestor 20 mg daily. No changes discussed. - Continue/refill Crestor 20 mg daily Continue to work on nutrition plan -decreasing simple carbohydrates, increasing lean proteins, decreasing saturated fats and cholesterol , avoiding trans fats and exercise as able to promote weight loss, improve lipids and decrease cardiovascular risks. May be a candidate for additional medication for lipid management to further decrease CV risks.   Constipation Constipation likely secondary to iron supplementation. Discussed Metamucil gummies (sugar-free) and importance of hydration. Emphasized gradual fiber intake increase to avoid bloating. - Start Metamucil gummies (sugar-free) - Ensure 80-100 ounces of fluid intake daily - Increase fiber intake gradually  General Health Maintenance Encouraged regular exercise and provided resources for online Zumba classes to support weight loss and overall health. Discussed importance of hydration and fiber intake. - Encourage regular exercise, including online Zumba classes - Discussed importance of hydration and fiber intake  Follow-up - Follow up on October 16, 2023, at 10:00 AM - Send prescription for additional Mounjaro  doses.  Vitals Temp: 97.8 F (36.6 C) BP: (!) 144/93 Pulse Rate: 68 SpO2: 100 %   Anthropometric Measurements Height: 5\' 2"  (1.575 m) Weight: 276 lb (125.2 kg) BMI (Calculated): 50.47 Weight at Last Visit: 283 lb Weight Lost Since Last Visit: 7 lb Weight Gained Since Last Visit: 0 Starting Weight: 315 lb Total Weight Loss (lbs): 39 lb (17.7 kg) Peak Weight: 321 lb   Body Composition  Body Fat %: 55.3 % Fat Mass (lbs): 55.3 lbs Muscle Mass (lbs): 117.6 lbs Visceral Fat Rating : 21   Other Clinical Data Fasting: no Labs: no Today's Visit #: 11 Starting Date: 11/18/22     ASSESSMENT AND PLAN:  Diet: Amy is currently in the action stage of change. As such, her goal is to continue with weight loss efforts. She has agreed to Category 2 Plan.  Exercise: Amy has been instructed  discussed considering on line Zumba classes  for weight loss and overall health benefits.   Behavior Modification:  We discussed the following Behavioral Modification Strategies today: increasing lean protein intake, decreasing simple carbohydrates, increasing vegetables, increase H2O intake, increase high fiber foods, no skipping meals, emotional eating strategies , and holiday eating strategies. We discussed various medication options to help Amy with her weight loss efforts and we both agreed to continue Mounjaro 7.5 mg weekly and metformin 500 mg BID  for Type 2 diabetes management.  Return in about 4 weeks (around 10/02/2023).[name] She was informed of the importance of frequent follow up visits to maximize her success with intensive lifestyle modifications for her multiple health conditions.  Attestation Statements:   Reviewed by clinician on day of visit: allergies, medications, problem list, medical history, surgical history, family history, social history, and previous encounter notes.   Time spent on visit including pre-visit chart review and post-visit care and charting was 34  minutes.    [place] Flett, PA-C

## 2023-10-01 DIAGNOSIS — G4733 Obstructive sleep apnea (adult) (pediatric): Secondary | ICD-10-CM | POA: Diagnosis not present

## 2023-10-09 ENCOUNTER — Other Ambulatory Visit (INDEPENDENT_AMBULATORY_CARE_PROVIDER_SITE_OTHER): Payer: Self-pay | Admitting: Physician Assistant

## 2023-10-09 ENCOUNTER — Other Ambulatory Visit (HOSPITAL_COMMUNITY): Payer: Self-pay

## 2023-10-09 DIAGNOSIS — E1169 Type 2 diabetes mellitus with other specified complication: Secondary | ICD-10-CM

## 2023-10-09 DIAGNOSIS — E559 Vitamin D deficiency, unspecified: Secondary | ICD-10-CM

## 2023-10-10 ENCOUNTER — Other Ambulatory Visit: Payer: Self-pay

## 2023-10-15 NOTE — Progress Notes (Unsigned)
SUBJECTIVE: Discussed the use of AI scribe software for clinical note transcription with the patient, who gave verbal consent to proceed.  Chief Complaint: Obesity  Interim History: She is up 2 lbs since her last visit.  Down 10 lbs overall TBW loss of 4.14% Amy Cardenas, a 53 year old individual with a history of obesity, type 2 diabetes, hypertension, iron deficiency anemia, and vitamin D deficiency, presents for a follow-up visit regarding her obesity treatment plan. The patient is currently on a regimen of atenolol transitioning to carvedilol, metformin XR, Mounjaro, and ergocalciferol.  BP is up today.  She has been taking both atenolol and carvedilol, one in the morning and one at night. The patient also notes that her home readings have been lower than in office readings and are typically around 130/85.  Regarding her weight management, the patient has gained a minimal amount of weight over the holiday period. She attributes the gain to a disruption in her usual meal planning and preparation routine, leading to a lack of groceries and reliance on less healthy food options.  The patient acknowledges the need to get back on track with her meal planning and preparation. She has been using microwave meals as a convenient option and expresses interest in incorporating more prepared meals, such as [employer] prepared meals, into her diet.  The patient also acknowledges a lack of physical activity as a potential contributing factor to her weight gain. She reports difficulty finding time and energy for exercise, although she has tried downloading exercise apps on her phone.  The patient also reports issues with sleep, which she currently manages with hydroxyzine. She expresses feeling lethargic and drowsy the next day after taking this medication.  In summary, the patient presents with increased blood pressure and slight weight gain, likely due to disruptions in her meal planning and exercise  routine. She also reports sleep issues potentially related to her current medication. The patient acknowledges the need to refocus on her diet and exercise regimen to manage her obesity and associated conditions.  Amy is here to discuss her progress with her obesity treatment plan. She is on the Category 3 Plan and states she is following her eating plan approximately 80 % of the time. She states she is not exercising 0 minutes 0 times per week.   OBJECTIVE: Visit Diagnoses: Problem List Items Addressed This Visit     Diabetes mellitus (HCC)   Relevant Medications   tirzepatide (MOUNJARO) 7.5 MG/0.5ML Pen   OSA on CPAP   Vitamin D deficiency   Hypertension associated with diabetes (HCC) - Primary   Relevant Medications   carvedilol (COREG) 12.5 MG tablet   tirzepatide (MOUNJARO) 7.5 MG/0.5ML Pen   Morbid obesity (HCC) with starting BMI 57 (Chronic)   Relevant Medications   tirzepatide (MOUNJARO) 7.5 MG/0.5ML Pen   Other Visit Diagnoses       BMI 38.0-38.9,adult Current BMI 38.5          Obesity 53 year old with a 1-pound weight gain since the last visit.. Reports difficulty with meal planning and grocery shopping, especially during holidays. Currently on Mounjaro 7.5 mg weekly and compliant. Discussed meal planning, prepping, and benefits of [employer] prepared meats for easy options. Emphasized consistent eating schedule for weight management. - Continue Mounjaro 7.5 mg weekly - Provide grocery list and category three meal plan - Recommend [employer] prepared meats for easy meal options - Encourage meal planning and prepping - Maintain a consistent eating schedule  Hypertension Blood pressure elevated during visit. Currently  on atenolol 25 mg twice daily and carvedilol 6.25 mg twice daily. Home readings 130/85 mmHg. Decision to discontinue atenolol and increase carvedilol due to lower risk of weight gain related to BB use. - Discontinue atenolol - Increase carvedilol to 12.5 mg  twice daily - Monitor blood pressure at home and report significant changes Continue to work on nutrition plan to promote weight loss and improve BP control.  May need to add additional medication if not improved with further weight loss/current regimen.   Type 2 Diabetes Mellitus Lab Results  Component Value Date   HGBA1C 6.1 (H) 07/03/2023   HGBA1C 6.4 (H) 03/17/2023   HGBA1C 9.9 (H) 11/18/2022   Lab Results  Component Value Date   MICROALBUR 7.0 (H) 10/11/2019   LDLCALC 74 07/03/2023   CREATININE 0.99 07/03/2023    Currently on Mounjaro 7.5 mg weekly and metformin XR 500 mg twice daily. Denies mass in neck, dysphagia, dyspepsia, persistent hoarseness, abdominal pain, or N/V/Constipation or diarrhea. Has annual eye exam. Mood is stable.  No GI upset with metformin.  - Continue metformin XR 500 mg twice daily Continue/refill Mounjaro 7.5 mg weekly  She is working  on nutrition plan to decrease simple carbohydrates, increase lean proteins and exercise to promote weight loss and improve glycemic control .   Vitamin D Deficiency Currently on ergocalciferol 50,000 units once weekly. Last level 61.2. May be able to transition to OTC after completes this course. Endorses ongoing fatigue.  Low vitamin D levels can be associated with adiposity and may result in leptin resistance and weight gain. Also associated with fatigue. Currently on vitamin D supplementation without any adverse effects.  - Continue ergocalciferol 50,000 units once weekly- No refill needed today.  Recheck vitamin D level over next 1-2 months. Also has iron deficiency contributing to fatigue.   OSA on CPAP/Sleep Disturbance Reports difficulty sleeping and lethargy with hydroxyzine. Discussed alternative options including calm magnesium, which may help with sleep without next-day lethargy. Using CPAP with good overall compliance but endorses fatigue during daytime.  - Recommend calm magnesium powder or gummies for  sleep - Monitor sleep quality and daytime alertness  General Health Maintenance Discussed importance of physical activity. Provided resources for home exercise and encouraged tracking steps, aiming for 5,000 steps three days a week. Recommended 'Walking with Verlon Au' videos on YouTube. - Recommend 'Walking with Verlon Au' videos on YouTube for home exercise - Encourage tracking steps and aiming for 5,000 steps three days a week  Follow-up - Follow-up appointment on February 24th at 2 PM - Contact office for any issues with blood pressure or other concerns. Vitals Temp: 98.1 F (36.7 C) BP: (!) 156/76 Pulse Rate: 69 SpO2: 100 %   Anthropometric Measurements Height: 5\' 2"  (1.575 m) Weight: 278 lb (126.1 kg) BMI (Calculated): 50.83 Weight at Last Visit: 276 lb Weight Lost Since Last Visit: 2 lb Weight Gained Since Last Visit: 0 Starting Weight: 315 lb Total Weight Loss (lbs): 37 lb (16.8 kg) Peak Weight: 321 lb   Body Composition  Body Fat %: 56 % Fat Mass (lbs): 156.2 lbs Muscle Mass (lbs): 116.4 lbs Visceral Fat Rating : 21   Other Clinical Data Fasting: no Labs: no Today's Visit #: 12 Starting Date: 11/18/22     ASSESSMENT AND PLAN:  Diet: Amy is currently in the action stage of change. As such, her goal is to continue with weight loss efforts. She has agreed to Category 3 Plan.  Exercise: Amy has been instructed to work up  to a goal of 150 minutes of combined cardio and strengthening exercise per week for weight loss and overall health benefits.   Behavior Modification:  We discussed the following Behavioral Modification Strategies today: increasing lean protein intake, decreasing simple carbohydrates, increasing vegetables, increase H2O intake, increase high fiber foods, decreasing eating out, no skipping meals, meal planning and cooking strategies, keeping healthy foods in the home, avoiding temptations, and planning for success. We discussed various  medication options to help Amy with her weight loss efforts and we both agreed to continue Mounjaro 7.5 mg weekly for Type 2 diabetes management and continue to work on nutritional and behavioral strategies to promote weight loss.  .  Return in about 4 weeks (around 11/13/2023).[name] She was informed of the importance of frequent follow up visits to maximize her success with intensive lifestyle modifications for her multiple health conditions.  Attestation Statements:   Reviewed by clinician on day of visit: allergies, medications, problem list, medical history, surgical history, family history, social history, and previous encounter notes.   Time spent on visit including pre-visit chart review and post-visit care and charting was 40 minutes.    Maison Kestenbaum, PA-C

## 2023-10-16 ENCOUNTER — Encounter (INDEPENDENT_AMBULATORY_CARE_PROVIDER_SITE_OTHER): Payer: Self-pay | Admitting: Physician Assistant

## 2023-10-16 ENCOUNTER — Other Ambulatory Visit: Payer: Self-pay

## 2023-10-16 ENCOUNTER — Ambulatory Visit (INDEPENDENT_AMBULATORY_CARE_PROVIDER_SITE_OTHER): Payer: BC Managed Care – PPO | Admitting: Physician Assistant

## 2023-10-16 ENCOUNTER — Other Ambulatory Visit (HOSPITAL_COMMUNITY): Payer: Self-pay

## 2023-10-16 VITALS — BP 156/76 | HR 69 | Temp 98.1°F | Ht 62.0 in | Wt 278.0 lb

## 2023-10-16 DIAGNOSIS — D500 Iron deficiency anemia secondary to blood loss (chronic): Secondary | ICD-10-CM

## 2023-10-16 DIAGNOSIS — E1159 Type 2 diabetes mellitus with other circulatory complications: Secondary | ICD-10-CM

## 2023-10-16 DIAGNOSIS — E559 Vitamin D deficiency, unspecified: Secondary | ICD-10-CM | POA: Diagnosis not present

## 2023-10-16 DIAGNOSIS — Z6838 Body mass index (BMI) 38.0-38.9, adult: Secondary | ICD-10-CM

## 2023-10-16 DIAGNOSIS — E1169 Type 2 diabetes mellitus with other specified complication: Secondary | ICD-10-CM | POA: Diagnosis not present

## 2023-10-16 DIAGNOSIS — G4733 Obstructive sleep apnea (adult) (pediatric): Secondary | ICD-10-CM

## 2023-10-16 DIAGNOSIS — Z6841 Body Mass Index (BMI) 40.0 and over, adult: Secondary | ICD-10-CM

## 2023-10-16 DIAGNOSIS — I152 Hypertension secondary to endocrine disorders: Secondary | ICD-10-CM | POA: Diagnosis not present

## 2023-10-16 DIAGNOSIS — Z7985 Long-term (current) use of injectable non-insulin antidiabetic drugs: Secondary | ICD-10-CM

## 2023-10-16 DIAGNOSIS — Z7984 Long term (current) use of oral hypoglycemic drugs: Secondary | ICD-10-CM

## 2023-10-16 MED ORDER — TIRZEPATIDE 7.5 MG/0.5ML ~~LOC~~ SOAJ
7.5000 mg | SUBCUTANEOUS | 1 refills | Status: DC
  Filled 2023-10-16 – 2023-11-06 (×2): qty 2, 28d supply, fill #0

## 2023-10-16 MED ORDER — CARVEDILOL 12.5 MG PO TABS
12.5000 mg | ORAL_TABLET | Freq: Two times a day (BID) | ORAL | 3 refills | Status: DC
  Filled 2023-10-16: qty 60, 30d supply, fill #0
  Filled 2023-12-04: qty 60, 30d supply, fill #1

## 2023-11-04 ENCOUNTER — Other Ambulatory Visit (INDEPENDENT_AMBULATORY_CARE_PROVIDER_SITE_OTHER): Payer: Self-pay | Admitting: Physician Assistant

## 2023-11-04 DIAGNOSIS — E1169 Type 2 diabetes mellitus with other specified complication: Secondary | ICD-10-CM

## 2023-11-06 ENCOUNTER — Other Ambulatory Visit (HOSPITAL_COMMUNITY): Payer: Self-pay

## 2023-11-06 ENCOUNTER — Other Ambulatory Visit: Payer: Self-pay

## 2023-11-10 ENCOUNTER — Other Ambulatory Visit (INDEPENDENT_AMBULATORY_CARE_PROVIDER_SITE_OTHER): Payer: Self-pay | Admitting: Physician Assistant

## 2023-11-10 ENCOUNTER — Other Ambulatory Visit: Payer: Self-pay

## 2023-11-10 ENCOUNTER — Other Ambulatory Visit (HOSPITAL_COMMUNITY): Payer: Self-pay

## 2023-11-10 ENCOUNTER — Encounter (INDEPENDENT_AMBULATORY_CARE_PROVIDER_SITE_OTHER): Payer: Self-pay | Admitting: Physician Assistant

## 2023-11-10 ENCOUNTER — Other Ambulatory Visit (HOSPITAL_BASED_OUTPATIENT_CLINIC_OR_DEPARTMENT_OTHER): Payer: Self-pay

## 2023-11-10 DIAGNOSIS — E1169 Type 2 diabetes mellitus with other specified complication: Secondary | ICD-10-CM

## 2023-11-10 MED ORDER — METFORMIN HCL ER 500 MG PO TB24
500.0000 mg | ORAL_TABLET | Freq: Two times a day (BID) | ORAL | 0 refills | Status: DC
  Filled 2023-11-10 (×2): qty 60, 30d supply, fill #0

## 2023-11-11 ENCOUNTER — Other Ambulatory Visit: Payer: Self-pay

## 2023-11-13 ENCOUNTER — Other Ambulatory Visit (HOSPITAL_COMMUNITY): Payer: Self-pay

## 2023-11-17 ENCOUNTER — Encounter (INDEPENDENT_AMBULATORY_CARE_PROVIDER_SITE_OTHER): Payer: Self-pay | Admitting: Physician Assistant

## 2023-11-17 ENCOUNTER — Ambulatory Visit (INDEPENDENT_AMBULATORY_CARE_PROVIDER_SITE_OTHER): Payer: BC Managed Care – PPO | Admitting: Physician Assistant

## 2023-11-17 ENCOUNTER — Other Ambulatory Visit (HOSPITAL_COMMUNITY): Payer: Self-pay

## 2023-11-17 VITALS — BP 151/89 | HR 80 | Temp 98.6°F | Ht 62.0 in | Wt 275.0 lb

## 2023-11-17 DIAGNOSIS — I152 Hypertension secondary to endocrine disorders: Secondary | ICD-10-CM

## 2023-11-17 DIAGNOSIS — G4733 Obstructive sleep apnea (adult) (pediatric): Secondary | ICD-10-CM | POA: Diagnosis not present

## 2023-11-17 DIAGNOSIS — Z6841 Body Mass Index (BMI) 40.0 and over, adult: Secondary | ICD-10-CM

## 2023-11-17 DIAGNOSIS — Z7985 Long-term (current) use of injectable non-insulin antidiabetic drugs: Secondary | ICD-10-CM

## 2023-11-17 DIAGNOSIS — E1169 Type 2 diabetes mellitus with other specified complication: Secondary | ICD-10-CM | POA: Diagnosis not present

## 2023-11-17 DIAGNOSIS — E559 Vitamin D deficiency, unspecified: Secondary | ICD-10-CM

## 2023-11-17 DIAGNOSIS — E1159 Type 2 diabetes mellitus with other circulatory complications: Secondary | ICD-10-CM | POA: Diagnosis not present

## 2023-11-17 DIAGNOSIS — Z7984 Long term (current) use of oral hypoglycemic drugs: Secondary | ICD-10-CM

## 2023-11-17 DIAGNOSIS — N951 Menopausal and female climacteric states: Secondary | ICD-10-CM

## 2023-11-17 MED ORDER — TIRZEPATIDE 10 MG/0.5ML ~~LOC~~ SOAJ
10.0000 mg | SUBCUTANEOUS | 0 refills | Status: DC
  Filled 2023-11-17: qty 6, 84d supply, fill #0

## 2023-11-17 MED ORDER — METFORMIN HCL ER 500 MG PO TB24
500.0000 mg | ORAL_TABLET | Freq: Two times a day (BID) | ORAL | 0 refills | Status: DC
  Filled 2023-11-17 – 2023-12-05 (×2): qty 60, 30d supply, fill #0

## 2023-11-17 NOTE — Progress Notes (Signed)
 SUBJECTIVE:  Chief Complaint: Obesity  Interim History: She is down 3 lbs since last visit.  Down 40 lbs overall TBW loss of 12.7%  Amy is here to discuss her progress with her obesity treatment plan. She is on the Category 3 Plan and states she is following her eating plan approximately 85 % of the time. She states she is exercising walking 15-20 minutes 3 times per week.  Amy Cardenas is a 53 year old female with obesity who presents for follow-up of her obesity treatment plan.  She has lost 40 pounds over the past year and aims to lose an additional 20 pounds over the next six months. She incorporates more physical activity into her routine, such as walking around her yard and climbing stairs, despite challenges with weather and a busy schedule. She experiences increased hunger lately and we discussed increasing Mounjaro today.   Her current medications include Mounjaro 7.5 mg weekly, metformin 500 mg twice daily, carvedilol 12.5 mg twice daily, ergocalciferol 50,000 units weekly, ferrous sulfate for iron deficiency, and Crestor for hyperlipidemia. She recently ran out of metformin for a week but has resumed taking it. She also missed a dose of vitamin D and is unsure if she has more at home.  Her blood pressure was elevated during the visit, but she reports it is usually around 135-136/85 at home. She is cautious about sodium intake, using pink Himalayan salt and various herbs for seasoning rather than adding salt.  She has been experiencing night sweats and suspects peri-menopausal symptoms, with an appointment scheduled to address these concerns. She has been taking magnesium to aid sleep and is considering trying a magnesium powder called Calm.  OBJECTIVE: Visit Diagnoses: Problem List Items Addressed This Visit     Diabetes mellitus (HCC) - Primary   Relevant Medications   metFORMIN (GLUCOPHAGE-XR) 500 MG 24 hr tablet   tirzepatide (MOUNJARO) 10 MG/0.5ML Pen   OSA on CPAP    Vitamin D deficiency   Hypertension associated with diabetes (HCC)   Relevant Medications   metFORMIN (GLUCOPHAGE-XR) 500 MG 24 hr tablet   tirzepatide (MOUNJARO) 10 MG/0.5ML Pen   Morbid obesity (HCC) with starting BMI 57 (Chronic)   Relevant Medications   metFORMIN (GLUCOPHAGE-XR) 500 MG 24 hr tablet   tirzepatide (MOUNJARO) 10 MG/0.5ML Pen   Other Visit Diagnoses       Perimenopausal symptoms         Obesity Amy Cardenas, a 53 year old female, has lost 40 pounds over the past year and is currently on Mounjaro 7.5 mg weekly. She reports increased hunger and is considering increasing the dose. Discussed the importance of maintaining adequate protein intake and avoiding chronic under-eating to prevent metabolic slowdown. Increasing Mounjaro to 10 mg may enhance weight loss and appetite suppression, with potential side effects including nausea. Encouraged hydration and smaller, frequent meals to mitigate side effects. - Increase Mounjaro to 10 mg weekly - Encourage continued physical activity - Ensure adequate protein intake - Monitor weight loss and adjust plan as needed  Hypertension Blood pressure was elevated today, but she reports it is usually around 135-138/85 at home. Currently on carvedilol 12.5 mg BID. Discussed the importance of weight loss and sodium restriction in managing blood pressure. Further weight loss may improve blood pressure control, potentially reducing the need for additional medication. - Monitor blood pressure - Continue carvedilol 12.5 mg BID. Keep logging home BP and consider increasing if does not show further improvement as we increase Mounjaro further.  -  Encourage low sodium diet - Reassess blood pressure control with further weight loss  Type 2 Diabetes Mellitus Currently managed with Mounjaro 7.5 mg weekly and metformin 500 mg BID. She ran out of metformin for a week but has resumed taking it. Denies mass in neck, dysphagia, dyspepsia, persistent  hoarseness, abdominal pain, or N/V/Constipation or diarrhea. Has annual eye exam. Mood is stable.  No Gi or other side effects with metformin.  Lab Results  Component Value Date   HGBA1C 6.1 (H) 07/03/2023   HGBA1C 6.4 (H) 03/17/2023   HGBA1C 9.9 (H) 11/18/2022   Lab Results  Component Value Date   MICROALBUR 7.0 (H) 10/11/2019   LDLCALC 74 07/03/2023   CREATININE 0.99 07/03/2023    - Continue/Refill metformin 500 mg BID Continue/Increase Mounjaro to 10 mg weekly She is working  on nutrition plan to decrease simple carbohydrates, increase lean proteins and exercise to promote weight loss and improve glycemic control .   Vitamin D Deficiency Currently on ergocalciferol 50,000 units once weekly. She missed a dose and is unsure if she has more at home. No N/V or muscle weakness with Ergo.  Last vitamin D Lab Results  Component Value Date   VD25OH 61.2 07/03/2023    - Check vitamin D supply - Refill ergocalciferol 50,000 units once weekly if needed Low vitamin D levels can be associated with adiposity and may result in leptin resistance and weight gain. Also associated with fatigue.  Currently on vitamin D supplementation without any adverse effects such as nausea, vomiting or muscle weakness.    Iron Deficiency Currently on ferrous sulfate.  Menopausal Symptoms She reports night sweats and irritability, suggesting perimenopausal or menopausal symptoms. Discussed potential benefits of soy in diet and cautioned against unregulated supplements. Mentioned the new medication Veozah for menopausal symptoms and advised discussing with GYN. - Discuss menopausal symptoms with GYN - Consider dietary soy for symptom relief - Avoid unregulated supplements  General Health Maintenance Discussed the importance of a balanced diet, regular exercise, and cautious use of supplements. Recommended pre and probiotics for gut health. - Encourage balanced diet and regular exercise - Caution  against unregulated supplements - Consider pre and probiotics for gut health  Follow-up - Follow-up appointment on March 26 at 4 PM - Aim for 4 pounds weight loss over the next month  Vitals Temp: 98.6 F (37 C) BP: (!) 151/89 Pulse Rate: 80 SpO2: 94 %   Anthropometric Measurements Height: 5\' 2"  (1.575 m) Weight: 275 lb (124.7 kg) BMI (Calculated): 50.29 Weight at Last Visit: 278 lb Weight Lost Since Last Visit: 3 lb Weight Gained Since Last Visit: 0 Starting Weight: 315 lb Total Weight Loss (lbs): 40 lb (18.1 kg) Peak Weight: 321 lb   Body Composition  Body Fat %: 56.5 % Fat Mass (lbs): 155.4 lbs Muscle Mass (lbs): 113.6 lbs Visceral Fat Rating : 21   Other Clinical Data Fasting: no Labs: no Today's Visit #: 13 Starting Date: 11/18/22     ASSESSMENT AND PLAN:  Diet: Amy is currently in the action stage of change. As such, her goal is to continue with weight loss efforts and has agreed to the Category 3 Plan.   Exercise:  For substantial health benefits, adults should do at least 150 minutes (2 hours and 30 minutes) a week of moderate-intensity, or 75 minutes (1 hour and 15 minutes) a week of vigorous-intensity aerobic physical activity, or an equivalent combination of moderate- and vigorous-intensity aerobic activity. Aerobic activity should be performed in  episodes of at least 10 minutes, and preferably, it should be spread throughout the week.  Behavior Modification:  We discussed the following Behavioral Modification Strategies today: increasing lean protein intake, decreasing simple carbohydrates, increasing vegetables, increase H2O intake, increase high fiber foods, no skipping meals, meal planning and cooking strategies, avoiding temptations, and planning for success. We discussed various medication options to help Amy with her weight loss efforts and we both agreed to increase Mounjaro to 10 mg weekly for Type 2 diabetes, continue metformin for Type 2  diabetes and continue to work on nutritional and behavioral strategies to promote weight loss.  .  Return in about 4 weeks (around 12/15/2023).[name] She was informed of the importance of frequent follow up visits to maximize her success with intensive lifestyle modifications for her multiple health conditions.  Attestation Statements:   Reviewed by clinician on day of visit: allergies, medications, problem list, medical history, surgical history, family history, social history, and previous encounter notes.   Time spent on visit including pre-visit chart review and post-visit care and charting was 37 minutes  Brayen Bunn,PA-C

## 2023-11-18 ENCOUNTER — Other Ambulatory Visit: Payer: Self-pay

## 2023-11-18 ENCOUNTER — Ambulatory Visit (INDEPENDENT_AMBULATORY_CARE_PROVIDER_SITE_OTHER): Payer: BC Managed Care – PPO | Admitting: Physician Assistant

## 2023-11-18 DIAGNOSIS — Z1331 Encounter for screening for depression: Secondary | ICD-10-CM | POA: Diagnosis not present

## 2023-11-18 DIAGNOSIS — Z1231 Encounter for screening mammogram for malignant neoplasm of breast: Secondary | ICD-10-CM | POA: Diagnosis not present

## 2023-11-18 DIAGNOSIS — Z01419 Encounter for gynecological examination (general) (routine) without abnormal findings: Secondary | ICD-10-CM | POA: Diagnosis not present

## 2023-12-02 ENCOUNTER — Other Ambulatory Visit (HOSPITAL_COMMUNITY): Payer: Self-pay

## 2023-12-04 ENCOUNTER — Other Ambulatory Visit (INDEPENDENT_AMBULATORY_CARE_PROVIDER_SITE_OTHER): Payer: Self-pay | Admitting: Physician Assistant

## 2023-12-04 DIAGNOSIS — E559 Vitamin D deficiency, unspecified: Secondary | ICD-10-CM

## 2023-12-04 DIAGNOSIS — E1169 Type 2 diabetes mellitus with other specified complication: Secondary | ICD-10-CM

## 2023-12-05 ENCOUNTER — Other Ambulatory Visit (HOSPITAL_COMMUNITY): Payer: Self-pay

## 2023-12-16 NOTE — Progress Notes (Unsigned)
 SUBJECTIVE: Discussed the use of AI scribe software for clinical note transcription with the patient, who gave verbal consent to proceed.  Chief Complaint: Obesity  Interim History: She is up 2 lbs since her last visit.  Down 38 lbs overall TBW loss of 12.1%  Amy Cardenas is here to discuss her progress with her obesity treatment plan. She is on the Category 3 Plan and states she is following her eating plan approximately 80 % of the time. She states she is not exercising 0 minutes 0 times per week.  Amy Cardenas is a 53 year old female who presents for obesity treatment follow-up.  She is currently on Mounjaro 10 mg weekly and metformin 500 mg twice daily for obesity management. She missed one dose of Mounjaro in the past month due to travel and a busy schedule. When adhering to her injection schedule, she experiences increased satiety and nausea. Over the past month, she gained two pounds, primarily adipose tissue, but also gained a pound of muscle, attributed to increased physical activity while assisting her uncle in [place]. Her hydration and protein intake have been suboptimal, as she ran out of Premier protein drinks for about a week and a half.  She has a history of type 2 diabetes, managed with metformin 500 mg twice daily, and monitors her blood pressure at home, which ranges from 132-135/82-85 mmHg. 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 She feels tired and off track this past month due to unexpected events, affecting her nutrition and rest. Her energy level is generally good, and she is not experiencing excessive daytime sleepiness. She acknowledges the need for more exercise, particularly after dinner, to aid in blood sugar management.  Hypertension is managed with carvedilol 12.5 mg twice daily. Home blood pressure readings are slightly elevated but stable. She is not on a diuretic due to a sulfur allergy.  Hypercholesterolemia is managed with Crestor 20 mg daily. No specific  issues were discussed regarding cholesterol management during this visit.  She previously had a vitamin D deficiency, which was treated, and levels improved to 61. She has not been taking vitamin D supplements recently and reports limited sun exposure but plans to increase it.  She traveled to [place] to assist her uncle, which involved significant physical activity. Her daughter's involvement in volleyball affects her schedule. She regularly uses calm gummies.  She is seeing her PCP at [facility] over the next month and will have labs done 12/22/23 and requested labs we would normally have in follow up.  OBJECTIVE: Visit Diagnoses: Problem List Items Addressed This Visit     Diabetes mellitus (HCC) - Primary   Relevant Medications   metFORMIN (GLUCOPHAGE-XR) 500 MG 24 hr tablet   tirzepatide (MOUNJARO) 10 MG/0.5ML Pen   Vitamin D deficiency   BMI 50.0-59.9, adult (HCC) Current BMI 51.9   Relevant Medications   metFORMIN (GLUCOPHAGE-XR) 500 MG 24 hr tablet   tirzepatide (MOUNJARO) 10 MG/0.5ML Pen   Hypertension associated with diabetes (HCC)   Relevant Medications   carvedilol (COREG) 12.5 MG tablet   metFORMIN (GLUCOPHAGE-XR) 500 MG 24 hr tablet   tirzepatide (MOUNJARO) 10 MG/0.5ML Pen   Hyperlipidemia associated with type 2 diabetes mellitus (HCC)   Relevant Medications   carvedilol (COREG) 12.5 MG tablet   metFORMIN (GLUCOPHAGE-XR) 500 MG 24 hr tablet   tirzepatide (MOUNJARO) 10 MG/0.5ML Pen   Morbid obesity (HCC) with starting BMI 57 (Chronic)   Relevant Medications   metFORMIN (GLUCOPHAGE-XR) 500 MG  24 hr tablet   tirzepatide (MOUNJARO) 10 MG/0.5ML Pen   Obesity She is on Mounjaro 10 mg weekly for obesity management. Missed one dose of Mounjaro due to travel, affecting appetite suppression. Reports increased satiety and nausea when compliant.  . Emphasized adherence to the regimen and routine exercise to stabilize insulin levels and prevent adipose storage. - Continue  Mounjaro 10 mg weekly - Encourage adherence to medication schedule - Advise on balanced diet and regular exercise - Refill Mounjaro prescription Continue Cat. 3 nutrition plan   Type 2 Diabetes Mellitus She is on Mounjaro 10 mg weekly. metformin 500 mg twice daily without side effects. Emphasized the importance of regular exercise, particularly postprandial walks, to maintain stable blood glucose levels and prevent insulin spikes. She understands the role of dietary choices in diabetes management. Informed her about the rare risk of vision issues with Mounjaro, especially in uncontrolled diabetics with retinopathy, and stressed the need for regular ophthalmologic exams. Continue and refill Mounjaro 10 mg weekly.  - Continue metformin 500 mg twice daily - Encourage postprandial walks to stabilize blood glucose - Discuss dietary management of diabetes - Ensure regular ophthalmologic exams  Hypertension She is on carvedilol 12.5 mg twice daily. Home blood pressure readings are 132-135/82-85 mmHg. Not on diuretics due to sulfur allergy. Emphasized maintaining hydration and regular blood pressure monitoring. - Continue carvedilol 12.5 mg twice daily - Refill carvedilol prescription - Encourage regular home blood pressure monitoring  Hypercholesterolemia She is on Crestor 20 mg daily. No specific discussion about cholesterol levels during this visit. - Continue Crestor 20 mg daily  Vitamin D Deficiency Previous vitamin D level was 61, not recently checked. She is not taking supplements and has limited sun exposure. Suggested rechecking vitamin D levels in future visits and encouraged sun exposure for natural synthesis. - Recheck vitamin D levels in future visits - Encourage sun exposure for natural vitamin D synthesis  General Health Maintenance Discussed the importance of regular exercise and a balanced diet. Suggested postprandial walks for blood glucose management and overall health. Plan  to check fasting insulin, A1c, lipids, vitamin D, vitamin B12, and TSH during the next primary care visit. - Encourage regular postprandial walks - Advise on maintaining a balanced diet - Plan to check fasting insulin, A1c, lipids, vitamin D, vitamin B12, and TSH during the next primary care visit  Follow-up Primary care appointment scheduled for April 7th, with labs on March 31st. Plan to review lab results during the next visit and suggested obtaining a printout if not uploaded to the shared system. - Review lab results during the next visit - Obtain a printout of lab results if not uploaded to the shared system Vitals Temp: 98.5 F (36.9 C) BP: (!) 146/92 Pulse Rate: 78 SpO2: 100 %   Anthropometric Measurements Height: 5\' 2"  (1.575 m) Weight: 277 lb (125.6 kg) BMI (Calculated): 50.65 Weight at Last Visit: 275lb Weight Lost Since Last Visit: 0 lb Weight Gained Since Last Visit: 2 lb Starting Weight: 315 lb Total Weight Loss (lbs): 38 lb (17.2 kg) Peak Weight: 321 lb   Body Composition  Body Fat %: 56.7 % Fat Mass (lbs): 157.2 lbs Muscle Mass (lbs): 114.2 lbs Visceral Fat Rating : 21   Other Clinical Data Fasting: no Labs: no Today's Visit #: 14 Starting Date: 11/18/22     ASSESSMENT AND PLAN:  Diet: Amy Cardenas is currently in the action stage of change. As such, her goal is to continue with weight loss efforts. She has agreed  to Category 3 Plan.  Exercise: Amy Cardenas has been instructed to work up to a goal of 150 minutes of combined cardio and strengthening exercise per week for weight loss and overall health benefits.   Behavior Modification:  We discussed the following Behavioral Modification Strategies today: increasing lean protein intake, decreasing simple carbohydrates, increasing vegetables, increase H2O intake, increase high fiber foods, no skipping meals, meal planning and cooking strategies, better snacking choices, avoiding temptations, and planning for  success. We discussed various medication options to help Amy Cardenas with her weight loss efforts and we both agreed to continue Mounjaro 10 mg weekly and other medications as prescribed, continue to work on nutritional and behavioral strategies to promote weight loss.  .  Return in about 4 weeks (around 01/14/2024).[name] She was informed of the importance of frequent follow up visits to maximize her success with intensive lifestyle modifications for her multiple health conditions.  Attestation Statements:   Reviewed by clinician on day of visit: allergies, medications, problem list, medical history, surgical history, family history, social history, and previous encounter notes.   Time spent on visit including pre-visit chart review and post-visit care and charting was 33 minutes.    Jedaiah Rathbun, PA-C

## 2023-12-17 ENCOUNTER — Other Ambulatory Visit: Payer: Self-pay

## 2023-12-17 ENCOUNTER — Ambulatory Visit (INDEPENDENT_AMBULATORY_CARE_PROVIDER_SITE_OTHER): Payer: BC Managed Care – PPO | Admitting: Physician Assistant

## 2023-12-17 ENCOUNTER — Encounter (INDEPENDENT_AMBULATORY_CARE_PROVIDER_SITE_OTHER): Payer: Self-pay | Admitting: Physician Assistant

## 2023-12-17 VITALS — BP 146/92 | HR 78 | Temp 98.5°F | Ht 62.0 in | Wt 277.0 lb

## 2023-12-17 DIAGNOSIS — E559 Vitamin D deficiency, unspecified: Secondary | ICD-10-CM | POA: Diagnosis not present

## 2023-12-17 DIAGNOSIS — E1159 Type 2 diabetes mellitus with other circulatory complications: Secondary | ICD-10-CM | POA: Diagnosis not present

## 2023-12-17 DIAGNOSIS — Z6841 Body Mass Index (BMI) 40.0 and over, adult: Secondary | ICD-10-CM

## 2023-12-17 DIAGNOSIS — N951 Menopausal and female climacteric states: Secondary | ICD-10-CM

## 2023-12-17 DIAGNOSIS — G4733 Obstructive sleep apnea (adult) (pediatric): Secondary | ICD-10-CM

## 2023-12-17 DIAGNOSIS — E1169 Type 2 diabetes mellitus with other specified complication: Secondary | ICD-10-CM | POA: Diagnosis not present

## 2023-12-17 DIAGNOSIS — Z7985 Long-term (current) use of injectable non-insulin antidiabetic drugs: Secondary | ICD-10-CM

## 2023-12-17 DIAGNOSIS — I152 Hypertension secondary to endocrine disorders: Secondary | ICD-10-CM | POA: Diagnosis not present

## 2023-12-17 DIAGNOSIS — Z7984 Long term (current) use of oral hypoglycemic drugs: Secondary | ICD-10-CM

## 2023-12-17 MED ORDER — TIRZEPATIDE 10 MG/0.5ML ~~LOC~~ SOAJ
10.0000 mg | SUBCUTANEOUS | 0 refills | Status: DC
Start: 2023-12-17 — End: 2024-02-25
  Filled 2023-12-17 – 2024-01-20 (×3): qty 6, 84d supply, fill #0

## 2023-12-17 MED ORDER — METFORMIN HCL ER 500 MG PO TB24
500.0000 mg | ORAL_TABLET | Freq: Two times a day (BID) | ORAL | 0 refills | Status: DC
  Filled 2023-12-17 – 2024-01-05 (×2): qty 60, 30d supply, fill #0

## 2023-12-17 MED ORDER — CARVEDILOL 12.5 MG PO TABS
12.5000 mg | ORAL_TABLET | Freq: Two times a day (BID) | ORAL | 3 refills | Status: DC
Start: 2023-12-17 — End: 2024-02-25
  Filled 2023-12-17 – 2024-01-05 (×2): qty 60, 30d supply, fill #0
  Filled 2024-02-08: qty 60, 30d supply, fill #1

## 2023-12-18 ENCOUNTER — Other Ambulatory Visit (HOSPITAL_COMMUNITY): Payer: Self-pay

## 2023-12-22 DIAGNOSIS — I10 Essential (primary) hypertension: Secondary | ICD-10-CM | POA: Diagnosis not present

## 2023-12-22 DIAGNOSIS — E7841 Elevated Lipoprotein(a): Secondary | ICD-10-CM | POA: Diagnosis not present

## 2023-12-22 DIAGNOSIS — E782 Mixed hyperlipidemia: Secondary | ICD-10-CM | POA: Diagnosis not present

## 2023-12-27 ENCOUNTER — Other Ambulatory Visit (HOSPITAL_COMMUNITY): Payer: Self-pay

## 2023-12-29 DIAGNOSIS — E7841 Elevated Lipoprotein(a): Secondary | ICD-10-CM | POA: Diagnosis not present

## 2023-12-29 DIAGNOSIS — E782 Mixed hyperlipidemia: Secondary | ICD-10-CM | POA: Diagnosis not present

## 2023-12-29 DIAGNOSIS — I10 Essential (primary) hypertension: Secondary | ICD-10-CM | POA: Diagnosis not present

## 2024-01-05 ENCOUNTER — Other Ambulatory Visit (HOSPITAL_COMMUNITY): Payer: Self-pay

## 2024-01-05 ENCOUNTER — Other Ambulatory Visit: Payer: Self-pay

## 2024-01-14 NOTE — Congregational Nurse Program (Signed)
 Completed follow up with as it relates remote monitoring hypertension self monitoring to ensure the patient has no issues

## 2024-01-19 ENCOUNTER — Telehealth (INDEPENDENT_AMBULATORY_CARE_PROVIDER_SITE_OTHER): Payer: Self-pay

## 2024-01-19 NOTE — Telephone Encounter (Signed)
 Prior auth for Mounjaro  is not required.  Patient is a type 2 diabetic.

## 2024-01-20 ENCOUNTER — Other Ambulatory Visit (HOSPITAL_COMMUNITY): Payer: Self-pay

## 2024-01-20 ENCOUNTER — Other Ambulatory Visit: Payer: Self-pay

## 2024-01-21 ENCOUNTER — Ambulatory Visit (INDEPENDENT_AMBULATORY_CARE_PROVIDER_SITE_OTHER): Admitting: Physician Assistant

## 2024-02-09 ENCOUNTER — Other Ambulatory Visit (HOSPITAL_COMMUNITY): Payer: Self-pay

## 2024-02-22 DIAGNOSIS — G4733 Obstructive sleep apnea (adult) (pediatric): Secondary | ICD-10-CM | POA: Diagnosis not present

## 2024-02-24 NOTE — Progress Notes (Signed)
 SUBJECTIVE: Discussed the use of AI scribe software for clinical note transcription with the patient, who gave verbal consent to proceed.  Chief Complaint: Obesity  Interim History: She is down 11 pounds since her last visit. Down 49 pounds overall TBW loss of 15.6%  Amy Cardenas is here to discuss her progress with her obesity treatment plan. She is on the Category 3 Plan and states she is following her eating plan approximately 90 % of the time. She states she is exercising 20-30 minutes 2 times per week by walking. Amy Cardenas R Meche is a 53 year old female who presents for follow-up of her obesity treatment plan.  She has been actively managing her weight through dietary modifications and increased physical activity, including walking more frequently. She has achieved a weight loss of 49 pounds, which she notices in her clothing size. She experiences constipation, which she manages with magnesium and increased water intake. She is cautious with fruit intake due to her diabetes but is exploring options like melons and berries.  She has a history of type 2 diabetes and is currently on Mounjaro  10 mg weekly and metformin  XR 500 mg twice a day. She has been without metformin  for about a week due to difficulties with refills, which has made bowel movements more difficult.  She is currently taking carvedilol  12.5 mg twice a day for hypertension and reports home blood pressure readings typically between 132-138/82 mmHg. She attributes recent high readings to stress from family issues. She experiences dry mouth, likely due to insufficient water intake.  For hyperlipidemia, she takes rosuvastatin  20 mg daily. She also has a history of vitamin D  deficiency and was previously on ergocalciferol  50,000 units weekly, but she is not currently taking it. Her last vitamin D  level was 61.  She experiences perimenopausal symptoms, including nighttime hot flashes and moodiness, but notes these have improved recently. She  has not sought additional treatment for these symptoms.   Fasting labs next OV planned.  OBJECTIVE: Visit Diagnoses: Problem List Items Addressed This Visit     Diabetes mellitus (HCC) - Primary   Relevant Medications   tirzepatide  (MOUNJARO ) 10 MG/0.5ML Pen   rosuvastatin  (CRESTOR ) 20 MG tablet   metFORMIN  (GLUCOPHAGE -XR) 500 MG 24 hr tablet   Vitamin D  deficiency   Hypertension associated with diabetes (HCC)   Relevant Medications   tirzepatide  (MOUNJARO ) 10 MG/0.5ML Pen   rosuvastatin  (CRESTOR ) 20 MG tablet   carvedilol  (COREG ) 25 MG tablet   metFORMIN  (GLUCOPHAGE -XR) 500 MG 24 hr tablet   Hyperlipidemia associated with type 2 diabetes mellitus (HCC)   Relevant Medications   tirzepatide  (MOUNJARO ) 10 MG/0.5ML Pen   rosuvastatin  (CRESTOR ) 20 MG tablet   carvedilol  (COREG ) 25 MG tablet   metFORMIN  (GLUCOPHAGE -XR) 500 MG 24 hr tablet   Morbid obesity (HCC) with starting BMI 57 (Chronic)   Relevant Medications   tirzepatide  (MOUNJARO ) 10 MG/0.5ML Pen   metFORMIN  (GLUCOPHAGE -XR) 500 MG 24 hr tablet   Other Visit Diagnoses       BMI 45.0-49.9, adult (HCC) Current BMI 48.7       Relevant Medications   tirzepatide  (MOUNJARO ) 10 MG/0.5ML Pen   metFORMIN  (GLUCOPHAGE -XR) 500 MG 24 hr tablet     Obesity She is on Mounjaro  10 mg weekly for obesity management and has lost 49 pounds, approximately 15% of her total body weight, due to the medication and lifestyle changes, including increased physical activity. Occasional constipation is likely related to the medication and insufficient water intake. The importance of  maintaining weight loss for overall health benefits, including reducing visceral adipose tissue, was emphasized. Her visceral adipose tissue has decreased from 32 to 20, with a target of 12 or less. Mounjaro  is considered effective for type 2 diabetes and weight loss, with studies showing an average weight loss of 15-17%. - Continue Mounjaro  10 mg weekly - Encourage  increased water intake to alleviate constipation and increase fiber intake.  - Advise on dietary choices to aid bowel function, including melons, peaches, nectarines, apples, and berries, vegetables - Encourage continued physical activity, including walking and strengthening exercises - Consider repeating the metabolic rate test if weight loss stalls  Type 2 Diabetes Mellitus She is on Mounjaro  10 mg weekly and  on metformin  XR 500 mg twice a day, but has been without it for a week due to refill issues. Metformin  is important for diabetes management and bowel function. She is advised to resume metformin  to aid in blood glucose control and improve bowel function on GLP/GIP medication.  Lab Results  Component Value Date   HGBA1C 6.1 (H) 07/03/2023   HGBA1C 6.4 (H) 03/17/2023   HGBA1C 9.9 (H) 11/18/2022   Lab Results  Component Value Date   MICROALBUR 7.0 (H) 10/11/2019   LDLCALC 74 07/03/2023   CREATININE 0.99 07/03/2023  Refill Mounjaro  10 mg weekly. - Refill metformin  XR 500 mg twice a day Labs next visit.  - Ensure medication refills are available to prevent future lapses She is working  on nutrition plan to decrease simple carbohydrates, increase lean proteins and exercise to promote weight loss and improve glycemic control .  Meds ordered this encounter  Medications   tirzepatide  (MOUNJARO ) 10 MG/0.5ML Pen    Sig: Inject 10 mg into the skin once a week.    Dispense:  6 mL    Refill:  0   rosuvastatin  (CRESTOR ) 20 MG tablet    Sig: Take 1 tablet (20 mg total) by mouth daily.    Dispense:  90 tablet    Refill:  2   DISCONTD: metFORMIN  (GLUCOPHAGE -XR) 500 MG 24 hr tablet    Sig: Take 1 tablet (500 mg total) by mouth 2 (two) times daily with a meal.    Dispense:  60 tablet    Refill:  0   carvedilol  (COREG ) 25 MG tablet    Sig: Take 1 tablet (25 mg total) by mouth 2 (two) times daily with a meal.    Dispense:  60 tablet    Refill:  3   metFORMIN  (GLUCOPHAGE -XR) 500 MG 24 hr  tablet    Sig: Take 1 tablet (500 mg total) by mouth 2 (two) times daily with a meal.    Dispense:  60 tablet    Refill:  2    Hypertension Blood pressure was elevated during the visit, likely due to stress. She is on carvedilol  12.5 mg twice a day and reports occasional dry mouth, likely due to insufficient water intake. Her heart rate can tolerate an increase in carvedilol  dosage. The decision was made to increase the dose to 25 mg twice a day to better control blood pressure and prevent complications such as kidney damage, heart attack, and stroke. She was advised to monitor her blood pressure at home, as it has been running slightly high, even when not stressed. BP Readings from Last 3 Encounters:  02/25/24 (!) 170/108  12/17/23 (!) 146/92  11/17/23 (!) 151/89   Lab Results  Component Value Date   NA 143 07/03/2023   CL  105 07/03/2023   K 4.6 07/03/2023   CO2 23 07/03/2023   BUN 30 (H) 07/03/2023   CREATININE 0.99 07/03/2023   EGFR 69 07/03/2023   CALCIUM  9.6 07/03/2023   ALBUMIN 4.3 07/03/2023   GLUCOSE 90 07/03/2023   Continue to work on nutrition plan to promote weight loss and improve BP control.  - Increase carvedilol  to 25 mg twice a day - Monitor blood pressure at home regularly - Contact the office if blood pressure is low or if there are any adverse effects  Hyperlipidemia She is on rosuvastatin  20 mg daily for hyperlipidemia management. Medication refills will be ensured to maintain lipid control. Lab Results  Component Value Date   CHOL 132 07/03/2023   CHOL 172 03/17/2023   CHOL 192 11/18/2022   Lab Results  Component Value Date   HDL 30 (L) 07/03/2023   HDL 30 (L) 03/17/2023   HDL 31 (L) 11/18/2022   Lab Results  Component Value Date   LDLCALC 74 07/03/2023   LDLCALC 119 (H) 03/17/2023   LDLCALC 117 (H) 11/18/2022   Lab Results  Component Value Date   TRIG 164 (H) 07/03/2023   TRIG 127 03/17/2023   TRIG 248 (H) 11/18/2022   Lab Results   Component Value Date   CHOLHDL 5.7 (H) 03/17/2023   CHOLHDL 6.2 (H) 11/18/2022   CHOLHDL 4.9 07/27/2020   No results found for: "LDLDIRECT" Continue to work on nutrition plan -decreasing simple carbohydrates, increasing lean proteins, decreasing saturated fats and cholesterol , avoiding trans fats and exercise as able to promote weight loss, improve lipids and decrease cardiovascular risks. - Refill rosuvastatin  20 mg daily  Vitamin D  Deficiency Her last vitamin D  level was 61, which is within the goal range. She is not currently taking any vitamin D  supplements. A recheck of vitamin D  levels is planned to determine if supplementation is needed. Last vitamin D  Lab Results  Component Value Date   VD25OH 61.2 07/03/2023   Low vitamin D  levels can be associated with adiposity and may result in leptin resistance and weight gain. Also associated with fatigue.  Currently on vitamin D  supplementation without any adverse effects such as nausea, vomiting or muscle weakness.  - Recheck vitamin D  levels at next visit  General Health Maintenance Encouraged to maintain a healthy lifestyle, including regular physical activity and a balanced diet. Provided guidance on fruit choices that are beneficial for bowel function and compatible with diabetes management. Discussed the importance of maintaining a routine and consistency in physical activity to support weight loss and overall health. - Continue regular physical activity, including walking during lunch breaks and incorporating strengthening exercises - Consider online resources for at-home exercise routines - Schedule fasting labs for the next visit  Follow-up Scheduled for a follow-up visit on August 4th for fasting labs. She is advised to ensure she has enough medication refills until the next appointment. If any issues arise with the increased carvedilol  dosage, she should contact the office for further guidance. - Follow-up visit on August 4th  for fasting labs - Ensure medication refills are available until the next appointment - Contact the office if issues arise with medication adjustments  Vitals Temp: 98.3 F (36.8 C) BP: (!) 170/108 2nd BP 161/100 3rd BP 140/90 Pulse Rate: 79 SpO2: 100 %     Anthropometric Measurements Height: 5\' 2"  (1.575 m) Weight: 266 lb (120.7 kg) BMI (Calculated): 48.64 Weight at Last Visit: 277 lb Weight Lost Since Last Visit: 11 lb Weight  Gained Since Last Visit: 0 lb Starting Weight: 315 lb Total Weight Loss (lbs): 49 lb (22.2 kg) Peak Weight: 321 lb     Body Composition  Body Fat %: 55.4 % Fat Mass (lbs): 147.2 lbs Muscle Mass (lbs): 112.8lbs Visceral Fat Rating : 20     Other Clinical Data Fasting: no Labs: no Today's Visit #: 15 Starting Date: 11/18/22  ASSESSMENT AND PLAN:  Diet: Amy Cardenas is currently in the action stage of change. As such, her goal is to continue with weight loss efforts. She has agreed to Category 3 Plan.  Exercise: Amy Cardenas has been instructed to work up to a goal of 150 minutes of combined cardio and strengthening exercise per week for weight loss and overall health benefits.   Behavior Modification:  We discussed the following Behavioral Modification Strategies today: increasing lean protein intake, decreasing simple carbohydrates, increasing vegetables, increase H2O intake, increase high fiber foods, no skipping meals, meal planning and cooking strategies, avoiding temptations, and planning for success. We discussed various medication options to help Amy Cardenas with her weight loss efforts and we both agreed to continue current treatment plan. continue to work on nutritional and behavioral strategies to promote weight loss.  .  Follow up in ~ 6-8 weeks. She was informed of the importance of frequent follow up visits to maximize her success with intensive lifestyle modifications for her multiple health conditions.  Attestation Statements:   Reviewed by  clinician on day of visit: allergies, medications, problem list, medical history, surgical history, family history, social history, and previous encounter notes.   Time spent on visit including pre-visit chart review and post-visit care and charting was 25 minutes.    Corene Resnick, PA-C

## 2024-02-25 ENCOUNTER — Ambulatory Visit (INDEPENDENT_AMBULATORY_CARE_PROVIDER_SITE_OTHER): Admitting: Physician Assistant

## 2024-02-25 ENCOUNTER — Other Ambulatory Visit (HOSPITAL_COMMUNITY): Payer: Self-pay

## 2024-02-25 ENCOUNTER — Other Ambulatory Visit: Payer: Self-pay

## 2024-02-25 ENCOUNTER — Encounter (INDEPENDENT_AMBULATORY_CARE_PROVIDER_SITE_OTHER): Payer: Self-pay | Admitting: Physician Assistant

## 2024-02-25 VITALS — BP 170/108 | HR 79 | Temp 98.3°F | Ht 62.0 in | Wt 266.0 lb

## 2024-02-25 DIAGNOSIS — E1169 Type 2 diabetes mellitus with other specified complication: Secondary | ICD-10-CM | POA: Diagnosis not present

## 2024-02-25 DIAGNOSIS — E785 Hyperlipidemia, unspecified: Secondary | ICD-10-CM | POA: Diagnosis not present

## 2024-02-25 DIAGNOSIS — I152 Hypertension secondary to endocrine disorders: Secondary | ICD-10-CM

## 2024-02-25 DIAGNOSIS — E1159 Type 2 diabetes mellitus with other circulatory complications: Secondary | ICD-10-CM | POA: Diagnosis not present

## 2024-02-25 DIAGNOSIS — N951 Menopausal and female climacteric states: Secondary | ICD-10-CM

## 2024-02-25 DIAGNOSIS — Z6841 Body Mass Index (BMI) 40.0 and over, adult: Secondary | ICD-10-CM

## 2024-02-25 DIAGNOSIS — Z7985 Long-term (current) use of injectable non-insulin antidiabetic drugs: Secondary | ICD-10-CM

## 2024-02-25 DIAGNOSIS — Z7984 Long term (current) use of oral hypoglycemic drugs: Secondary | ICD-10-CM

## 2024-02-25 DIAGNOSIS — E559 Vitamin D deficiency, unspecified: Secondary | ICD-10-CM

## 2024-02-25 MED ORDER — METFORMIN HCL ER 500 MG PO TB24
500.0000 mg | ORAL_TABLET | Freq: Two times a day (BID) | ORAL | 0 refills | Status: DC
  Filled 2024-02-25: qty 60, 30d supply, fill #0

## 2024-02-25 MED ORDER — CARVEDILOL 25 MG PO TABS
25.0000 mg | ORAL_TABLET | Freq: Two times a day (BID) | ORAL | 3 refills | Status: DC
Start: 2024-02-25 — End: 2024-04-26
  Filled 2024-02-25: qty 60, 30d supply, fill #0
  Filled 2024-04-05: qty 60, 30d supply, fill #1

## 2024-02-25 MED ORDER — ROSUVASTATIN CALCIUM 20 MG PO TABS
20.0000 mg | ORAL_TABLET | Freq: Every day | ORAL | 2 refills | Status: DC
  Filled 2024-02-25 – 2024-04-05 (×4): qty 90, 90d supply, fill #0

## 2024-02-25 MED ORDER — TIRZEPATIDE 10 MG/0.5ML ~~LOC~~ SOAJ
10.0000 mg | SUBCUTANEOUS | 0 refills | Status: DC
  Filled 2024-02-25 – 2024-04-05 (×4): qty 6, 84d supply, fill #0

## 2024-02-25 MED ORDER — METFORMIN HCL ER 500 MG PO TB24
500.0000 mg | ORAL_TABLET | Freq: Two times a day (BID) | ORAL | 2 refills | Status: DC
  Filled 2024-02-25: qty 60, 30d supply, fill #0
  Filled 2024-04-05: qty 60, 30d supply, fill #1

## 2024-02-26 ENCOUNTER — Other Ambulatory Visit (HOSPITAL_COMMUNITY): Payer: Self-pay

## 2024-02-26 ENCOUNTER — Other Ambulatory Visit: Payer: Self-pay

## 2024-03-18 ENCOUNTER — Ambulatory Visit: Payer: BC Managed Care – PPO | Admitting: Neurology

## 2024-03-18 VITALS — BP 176/101 | HR 69 | Ht 61.0 in | Wt 268.4 lb

## 2024-03-18 DIAGNOSIS — G4733 Obstructive sleep apnea (adult) (pediatric): Secondary | ICD-10-CM

## 2024-03-18 NOTE — Patient Instructions (Signed)
 Please continue using your autoPAP regularly. While your insurance requires that you use PAP at least 4 hours each night on 70% of the nights, I recommend, that you not skip any nights and use it throughout the night if you can. Getting used to PAP and staying with the treatment long term does take time and patience and discipline. Untreated obstructive sleep apnea when it is moderate to severe can have an adverse impact on cardiovascular health and raise her risk for heart disease, arrhythmias, hypertension, congestive heart failure, stroke and diabetes. Untreated obstructive sleep apnea causes sleep disruption, nonrestorative sleep, and sleep deprivation. This can have an impact on your day to day functioning and cause daytime sleepiness and impairment of cognitive function, memory loss, mood disturbance, and problems focussing. Using PAP regularly can improve these symptoms.  We can see you in 1 year, you can see one of our nurse practitioners as you are stable.  We can offer you a MyChart video visit which is a virtual visit if you prefer.

## 2024-03-18 NOTE — Progress Notes (Signed)
 Subjective:    Patient ID: Amy Cardenas is a 53 y.o. female.  HPI   Interim history:   Amy Cardenas is a 53 year old female with an underlying medical history of anemia, back pain, pre-diabetes, hypertension, hyperlipidemia, anxiety, and morbid obesity with a BMI of over 50, who presents for follow-up consultation of her OSA, on PAP therapy.  The patient is unaccompanied today and presents for her 1 year checkup.  I first met her in June 2024, at which time she transferred care from her previous neurologist who retired.  Today, 03/18/2024: I reviewed her AutoPap compliance data from 02/16/2024 through 03/16/2024, which is a total of 30 days, during which time she used her machine every night with percent use days greater than 4 hours at 97%, indicating excellent compliance with an average usage of 6 hours and 29 minutes, residual AHI at goal at 0.5/h, average pressure for the 95th percentile at 14 cm with a range of 9 to 16 cm with EPR of 2.  Leak acceptable with fluctuation, 95th percentile at 18.2 L/min.  She reports overall doing well with her machine, she benefits from it, she tries to make time for sleep but sometimes gets very busy.  She is not always good with her water intake she admits, she estimates that she drinks about 36 ounces of water per day.  She no longer drinks any sodas.  She is very motivated to continue with her AutoPap but does not like the mask that she was sent by her DME.  She would like the sample mask she got from our office last year and may need to reorder it through her DME company.  Of note, her AutoPap set up date was 07/29/2022.     The patient's allergies, current medications, family history, past medical history, past social history, past surgical history and problem list were reviewed and updated as appropriate.   Previously:  03/12/2021: (She) was previously diagnosed with obstructive sleep apnea and placed on PAP therapy.  I have reviewed records from [facility]  neurology.  She saw nurse practitioner Amy Cardenas on 1 02/28/2022 for sleep consultation.  She had a subsequent home sleep test through [facility] on 03/14/2022.  Her AHI was 92.6/h, O2 nadir 75%.  She was started on AutoPap therapy.  Her DME company is [employer].  She has a ResMed air sense 10 AutoSet machine.   She is followed by medical weight management and reports having lost about 25 pounds thus far.  She is not on chlorthalidone  as she has a sulfa  allergy.  She was not given an alternative diuretic yet.  Her Epworth sleepiness score is 13 out of 24, fatigue severity score is 36 out of 63.  She does report improvement in her symptoms, in fact, her Epworth sleepiness score in June 2023 was 19 out of 24.  She reports improvement in her sleep disruption, sleep quality, daytime energy and daytime sleepiness, as well as nocturia and snoring.  She used to have more nocturia and now gets up about once per night.  She usually tries to be in bed between 9 and 10 and rise time is around 5:30 AM.  Currently she is going to bed later because of summer camp her daughter is in.  She may be in bed between 11 and midnight and rise time still around 5:30 AM for work.  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  She lives with her 2 children, 29 year old son  and 28 year old daughter.  They have no pets in the household.  She denies any recurrent morning headaches but used to have headaches in the mornings.  She does not drink caffeine daily, does not drink alcohol, she is a non-smoker, smoked a little bit several years ago but never on a regular basis. I was able to review her PAP compliance data from 30 and 90 days.  In the past month her usage has declined a little bit, she used her machine 22 out of 30 days with percent use days greater than 4 hours at 73%, indicating adequate compliance, average AHI of 0.9/h, leak on the higher side with the 95th percentile at 26.1 L/min, average  pressure of 15.5 cm for the 95th percentile, pressure range of 8-20 with EPR of 2 and no ramp.  Over the past 90 days she used her machine 76 out of 90 days with percent use days greater than 4 hours at 80%, better compliance, average usage of about the same, little over 6 hours.  She uses a [employer] fullface mask size extra small.  She does have some trouble tolerating the mask, it dislodges and leaks and is at times uncomfortable but she is a mouth breather.  She has mild mouth dryness from it.  She does have good motivation to continue with treatment.  Her blood pressure used to be a lot higher, in fact, at the visit with Ms. Perri, nurse practitioner on 02/28/2022, her blood pressure was 191/117.  Her Past Medical History Is Significant For: Past Medical History:  Diagnosis Date   Abnormal mammogram of right breast 08/04/2018   Anemia    Anxiety    Back pain    Diabetes mellitus without complication (HCC) 05/26/2018   High cholesterol    Hypertension    Obesity    Pre-diabetes    Sleep apnea    SOB (shortness of breath)    Umbilical hernia     Her Past Surgical History Is Significant For: Past Surgical History:  Procedure Laterality Date   BREAST LUMPECTOMY WITH RADIOACTIVE SEED LOCALIZATION Right 08/04/2018   Procedure: RADIOACTIVE SEED GUIDED RIGHT BREAST LUMPECTOMY;  Surgeon: Amy Favorite, MD;  Location: [facility] OR;  Service: General;  Laterality: Right;   CESAREAN SECTION  1999, 2014   x2   CESAREAN SECTION WITH BILATERAL TUBAL LIGATION  10/15/2012   Procedure: CESAREAN SECTION WITH BILATERAL TUBAL LIGATION;  Surgeon: Amy Just, DO;  Location: [facility] ORS;  Service: Obstetrics;  Laterality: N/A;  Repeat   TUBAL LIGATION     WISDOM TOOTH EXTRACTION  1990    Her Family History Is Significant For: Family History  Problem Relation Age of Onset   Hyperlipidemia Father    Hypertension Father    Diabetes Father    Cancer Father        prostate and hx of colon   Colon  cancer Father    Depression Father    Anxiety disorder Father    Liver disease Father    Alcohol abuse Father    Sleep apnea Maternal Uncle    Colon cancer Maternal Grandmother     Her Social History Is Significant For: Social History   Socioeconomic History   Marital status: Single    Spouse name: Not on file   Number of children: Not on file   Years of education: Not on file   Highest education level: Not on file  Occupational History   Not on file  Tobacco Use  Smoking status: Former    Current packs/day: 0.00    Average packs/day: 0.2 packs/day for 3.0 years (0.4 ttl pk-yrs)    Types: Cigarettes    Start date: 08/24/2007    Quit date: 09/23/2008    Years since quitting: 15.4   Smokeless tobacco: Never  Vaping Use   Vaping status: Never Used  Substance and Sexual Activity   Alcohol use: No    Alcohol/week: 0.0 standard drinks of alcohol   Drug use: No   Sexual activity: Not Currently    Birth control/protection: Surgical  Other Topics Concern   Not on file  Social History Narrative   Not on file   Social Drivers of Health   Financial Resource Strain: Not on file  Food Insecurity: Not on file  Transportation Needs: Not on file  Physical Activity: Not on file  Stress: Not on file  Social Connections: Not on file    Her Allergies Are:  Allergies  Allergen Reactions   Penicillins Hives, Swelling and Other (See Comments)    PATIENT HAS HAD A PCN REACTION WITH IMMEDIATE RASH, FACIAL/TONGUE/THROAT SWELLING, SOB, OR LIGHTHEADEDNESS WITH HYPOTENSION:  #  #  YES  #  #  Has patient had a PCN reaction causing severe rash involving mucus membranes or skin necrosis: No PATIENT HAS HAD A PCN REACTION THAT REQUIRED HOSPITALIZATION:  #  #  YES  #  #  Has patient had a PCN reaction occurring within the last 10 years:No     Losartan  Swelling    Lip swelling   Misc. Sulfonamide Containing Compounds Itching   Norvasc  [Amlodipine ] Swelling    Lip swelling   Sulfa   Antibiotics Rash  :   Her Current Medications Are:  Outpatient Encounter Medications as of 03/18/2024  Medication Sig   albuterol  (VENTOLIN  HFA) 108 (90 Base) MCG/ACT inhaler Inhale 1 puff into the lungs every 4 (four) hours as needed.   Blood Glucose Monitoring Suppl (ONETOUCH VERIO) w/Device KIT Use as directed to test blood sugar in the morning, at noon, and at bedtime.   carvedilol  (COREG ) 25 MG tablet Take 1 tablet (25 mg total) by mouth 2 (two) times daily with a meal.   Ferrous Sulfate  (IRON PO) Take by mouth.   fluticasone -salmeterol (ADVAIR ) 250-50 MCG/ACT AEPB Inhale 1 puff into the lungs at bedtime.   fluticasone -salmeterol (ADVAIR ) 250-50 MCG/ACT AEPB Inhale 1 puff into the lungs daily.   glucose blood (ONETOUCH VERIO) test strip Use 1 each as directed in the morning, at noon, and at bedtime.   hydrOXYzine  (VISTARIL ) 25 MG capsule Take 1-2 capsules (25-50 mg total) by mouth at bedtime as needed for insomnia.   hydrOXYzine  (VISTARIL ) 25 MG capsule Take 1 capsule (25 mg total) by mouth at bedtime as needed.   MAGNESIUM PO Take by mouth.   metFORMIN  (GLUCOPHAGE -XR) 500 MG 24 hr tablet Take 1 tablet (500 mg total) by mouth 2 (two) times daily with a meal.   Multiple Vitamin (MULTIVITAMIN ADULT PO) Take by mouth.   rosuvastatin  (CRESTOR ) 20 MG tablet Take 1 tablet (20 mg total) by mouth daily.   tirzepatide  (MOUNJARO ) 10 MG/0.5ML Pen Inject 10 mg into the skin once a week.   triamcinolone  (NASACORT ) 55 MCG/ACT AERO nasal inhaler Place 1 spray into each nostril daily.   Vitamin D , Ergocalciferol , (DRISDOL ) 1.25 MG (50000 UNIT) CAPS capsule Take 1 capsule (50,000 Units total) by mouth every 7 (seven) days.   No facility-administered encounter medications on file as of 03/18/2024.  :  Review of Systems:  Out of a complete 14 point review of systems, all are reviewed and negative with the exception of these symptoms as listed below:  Review of Systems  Neurological:        Cpap follow  up doing well, having issues with machine (broken filter cap)  FFM uncomfortable. Likes the one that she trialed here.  (Last year).  ESS   10 FSS 26.   Last OV states:a sample mask from [employer] today to try out, it is a F40 fullface mask, she was given a starter pack, probably best to try the small size.    Objective:  Neurological Exam  Physical Exam Physical Examination:   Vitals:   03/18/24 1320  BP: (!) 176/101  Pulse: 69  SpO2: 98%    General Examination: The patient is a very pleasant 53 y.o. female in no acute distress. She appears well-developed and well-nourished and well groomed.   HEENT: Normocephalic, atraumatic, pupils are equal, round and reactive to light, extraocular tracking is good without limitation to gaze excursion or nystagmus noted. Hearing is grossly intact. Face is symmetric with normal facial animation. Speech is clear with no dysarthria noted. There is no hypophonia. There is no lip, neck/head, jaw or voice tremor. Neck is supple with full range of passive and active motion. There are no carotid bruits on auscultation. Oropharynx exam reveals: mild mouth dryness, adequate dental hygiene and moderate airway crowding.  Tongue protrudes centrally and palate elevates symmetrically.   Chest: Clear to auscultation without wheezing, rhonchi or crackles noted.   Heart: S1+S2+0, regular and normal without murmurs, rubs or gallops noted.    Abdomen: Soft, non-tender and non-distended.   Extremities: There is 1+ pitting edema in the distal lower extremities bilaterally.  Mild chronic appearing redness in the distal lower extremities bilaterally, front/shin aspect.   Skin: Warm and dry without trophic changes noted.  Mild discoloration/erythema in the distal lower extremities bilaterally.   Musculoskeletal: exam reveals no obvious joint deformities.    Neurologically:  Mental status: The patient is awake, alert and oriented in all 4 spheres. Her immediate and remote  memory, attention, language skills and fund of knowledge are appropriate. There is no evidence of aphasia, agnosia, apraxia or anomia. Speech is clear with normal prosody and enunciation. Thought process is linear. Mood is normal and affect is normal.  Cranial nerves II - XII are as described above under HEENT exam.  Motor exam: Normal bulk, strength and tone is noted. There is no obvious action or resting tremor.  Fine motor skills and coordination: grossly intact.  Cerebellar testing: No dysmetria or intention tremor. There is no truncal or gait ataxia.  Sensory exam: intact to light touch in the upper and lower extremities.  Gait, station and balance: She stands easily. No veering to one side is noted. No leaning to one side is noted. Posture is age-appropriate and stance is narrow based. Gait shows normal stride length and normal pace. No problems turning are noted.    Assessment and Plan:  In summary, Amy Cardenas is a 53 year old female with an underlying medical history of anemia, back pain, pre-diabetes, hypertension, hyperlipidemia, anxiety, and morbid obesity with a BMI of over 50, who presents for follow-up consultation of her severe obstructive sleep apnea.  She had home sleep testing in 2023.  She has been on AutoPap therapy with excellent compliance.  She has good apnea control and good tolerance of treatment but would like to get the [employer]Med  F40 fullface mask.  She is encouraged to get in touch with her DME company to make sure she gets the correct mask reordered.  She is commended for her treatment adherence.  She had a home sleep test on 03/14/2022 through Dr. Vinson office which showed an AHI of 92.6/h, O2 nadir 75%.  She has been on AutoPap therapy since November 2023.  She is advised to follow-up in this clinic to see one of our nurse practitioners in about 1 year, we may be able to offer her a virtual visit through MyChart as well if she prefers.  I answered all her questions today  and she was in agreement.   I spent 20 minutes in total face-to-face time and in reviewing records during pre-charting, more than 50% of which was spent in counseling and coordination of care, reviewing test results, reviewing medications and treatment regimen and/or in discussing or reviewing the diagnosis of OSA, the prognosis and treatment options. Pertinent laboratory and imaging test results that were available during this visit with the patient were reviewed by me and considered in my medical decision making (see chart for details).

## 2024-03-30 DIAGNOSIS — G4733 Obstructive sleep apnea (adult) (pediatric): Secondary | ICD-10-CM | POA: Diagnosis not present

## 2024-04-06 ENCOUNTER — Other Ambulatory Visit: Payer: Self-pay

## 2024-04-06 ENCOUNTER — Other Ambulatory Visit (HOSPITAL_COMMUNITY): Payer: Self-pay

## 2024-04-13 DIAGNOSIS — E1169 Type 2 diabetes mellitus with other specified complication: Secondary | ICD-10-CM | POA: Diagnosis not present

## 2024-04-13 DIAGNOSIS — I10 Essential (primary) hypertension: Secondary | ICD-10-CM | POA: Diagnosis not present

## 2024-04-13 DIAGNOSIS — E7841 Elevated Lipoprotein(a): Secondary | ICD-10-CM | POA: Diagnosis not present

## 2024-04-13 DIAGNOSIS — E782 Mixed hyperlipidemia: Secondary | ICD-10-CM | POA: Diagnosis not present

## 2024-04-20 DIAGNOSIS — Z0000 Encounter for general adult medical examination without abnormal findings: Secondary | ICD-10-CM | POA: Diagnosis not present

## 2024-04-20 DIAGNOSIS — E1122 Type 2 diabetes mellitus with diabetic chronic kidney disease: Secondary | ICD-10-CM | POA: Diagnosis not present

## 2024-04-20 DIAGNOSIS — E7841 Elevated Lipoprotein(a): Secondary | ICD-10-CM | POA: Diagnosis not present

## 2024-04-20 DIAGNOSIS — I10 Essential (primary) hypertension: Secondary | ICD-10-CM | POA: Diagnosis not present

## 2024-04-25 NOTE — Progress Notes (Unsigned)
 SUBJECTIVE: Discussed the use of AI scribe software for clinical note transcription with the patient, who gave verbal consent to proceed.  Chief Complaint: Obesity  Interim History: She is down 13 lbs since her last visit. Down 62 lbs overall !!! TBW loss of 19.7% !!!  Muscle mass +5.4 lbs Adipose mass - 19 lbs !!!!! Amy Cardenas is here to discuss her progress with her obesity treatment plan. She is on the Category 3 Plan and states she is following her eating plan approximately 95 % of the time. She states she is exercising walking 30 minutes 3 times per week.  Amy Cardenas R Porro is a 53 year old female with obesity who presents for follow-up of her obesity treatment plan.  She has achieved a total weight loss of 62 pounds over the past year by focusing on better dietary choices and increasing protein intake. Despite limited exercise, she has noticed improvements in breathing and physical endurance. Her BMI was previously 59 and is now 46, and her body fat percentage was previously 60.2% and is now 50.8%.  Her blood pressure was 138/83 at a recent visit, though she notes it can be affected by sleep quality. Poor sleep due to family stress has been a recent issue. She experiences constipation, which she attributes to insufficient water intake.  We discussed to stay hydrated by carrying water and adding flavors like cucumber or berries. No stomach upset with metformin , which she finds helpful for managing constipation.  She is currently fasting for labs, including tests for vitamin levels, thyroid  function, B12, vitamin D , lipids, insulin , A1c, CBC, liver and kidney function, and electrolytes. She is preparing for a family gathering and is concerned about maintaining her dietary plan. We discussed celebration/holiday strategies.  She plans to focus on protein and portion control, avoiding tempting foods at home to prevent overeating.  No stomach upset with metformin  and no issues with carvedilol  or  Crestor . She experiences constipation but no vomiting or diarrhea with Mounjaro .  Fasting labs are obtained today. The patient was informed we would discuss the lab results at the next visit unless there is a critical issue that needs to be addressed sooner. The patient agreed to keep the next visit at the agreed upon time to discuss these results.   OBJECTIVE: Visit Diagnoses: Problem List Items Addressed This Visit     Other fatigue   Relevant Orders   Vitamin B12   CBC with Differential/Platelet   TSH   Diabetes mellitus (HCC) - Primary   Relevant Medications   rosuvastatin  (CRESTOR ) 20 MG tablet   metFORMIN  (GLUCOPHAGE -XR) 500 MG 24 hr tablet   tirzepatide  (MOUNJARO ) 10 MG/0.5ML Pen   Other Relevant Orders   CMP14+EGFR   Hemoglobin A1c   Insulin , random   Vitamin D  deficiency   Relevant Orders   VITAMIN D  25 Hydroxy (Vit-D Deficiency, Fractures)   Hypertension associated with diabetes (HCC)   Relevant Medications   rosuvastatin  (CRESTOR ) 20 MG tablet   carvedilol  (COREG ) 25 MG tablet   metFORMIN  (GLUCOPHAGE -XR) 500 MG 24 hr tablet   tirzepatide  (MOUNJARO ) 10 MG/0.5ML Pen   Hyperlipidemia associated with type 2 diabetes mellitus (HCC)   Relevant Medications   rosuvastatin  (CRESTOR ) 20 MG tablet   carvedilol  (COREG ) 25 MG tablet   metFORMIN  (GLUCOPHAGE -XR) 500 MG 24 hr tablet   tirzepatide  (MOUNJARO ) 10 MG/0.5ML Pen   Other Relevant Orders   Lipid Panel With LDL/HDL Ratio   Morbid obesity (HCC) with starting BMI 57 (Chronic)   Relevant  Medications   metFORMIN  (GLUCOPHAGE -XR) 500 MG 24 hr tablet   tirzepatide  (MOUNJARO ) 10 MG/0.5ML Pen   Other Visit Diagnoses       Constipation, unspecified constipation type         BMI 45.0-49.9, adult (HCC) Current BMI 46.3       Relevant Medications   metFORMIN  (GLUCOPHAGE -XR) 500 MG 24 hr tablet   tirzepatide  (MOUNJARO ) 10 MG/0.5ML Pen     Obesity Significant weight loss of 64 pounds over the past year, with a reduction in  BMI from 59 to 46. Improvement in adipose tissue percentage from 60.2% to 50.8% and visceral adipose rating from 25 to 17. The goal is to reduce adipose percentage to 35% or less and visceral adipose rating to 12. Emphasis on maintaining muscle mass while losing adipose tissue. - Continue current weight loss plan with emphasis on protein intake and mindful eating. - Discuss strategies for holiday eating, including portion control and mindful eating. - Monitor weight and adipose tissue percentage regularly.  Type 2 diabetes mellitus Improvement in glycemic control anticipated due to significant weight loss. A1c goal is less than 6. No current issues with metformin . Mounjaro  is being used with some constipation as a side effect, but no other significant adverse effects reported. Discussed potential need for long-term medication like Ozempic at a lower or less frequent dose depending on future weight loss and A1c results. - Order fasting labs including A1c, insulin , and other metabolic panels. - Continue/refill metformin  500 mg BID as it may help with constipation. - Continue/refill Mounjaro  10 mg weekly; consider dose adjustment based on weight loss and A1c results. - Monitor A1c and adjust diabetes medications as needed. Meds ordered this encounter  Medications   rosuvastatin  (CRESTOR ) 20 MG tablet    Sig: Take 1 tablet (20 mg total) by mouth daily.    Dispense:  90 tablet    Refill:  2   carvedilol  (COREG ) 25 MG tablet    Sig: Take 1 tablet (25 mg total) by mouth 2 (two) times daily with a meal.    Dispense:  60 tablet    Refill:  3   metFORMIN  (GLUCOPHAGE -XR) 500 MG 24 hr tablet    Sig: Take 1 tablet (500 mg total) by mouth 2 (two) times daily with a meal.    Dispense:  60 tablet    Refill:  2   tirzepatide  (MOUNJARO ) 10 MG/0.5ML Pen    Sig: Inject 10 mg into the skin once a week.    Dispense:  6 mL    Refill:  0    Hypertension Blood pressure was 138/83 two weeks ago, which is  acceptable. Current reading is borderline, possibly due to poor sleep. Continued weight loss is expected to improve blood pressure control. No issues with carvedilol  reported. - Continue carvedilol  25 mg BID at current dose. May add low dose of hydrochlorothiazide if BP not controlled at next visit.  Continue to work on nutrition plan to promote weight loss and improve BP control.   Hyperlipidemia No issues reported with Crestor . Continued weight loss is expected to improve lipid profile. - Continue Crestor  20 mg daily at current dose. - Order fasting lipid panel as part of lab work.  Constipation Constipation noted as a side effect of Mounjaro . Improved with increased water intake. No issues with metformin , which may help alleviate constipation. - Encourage increased water intake/fiber intake. - Continue metformin  as it may help with constipation.  Vitamin D  deficiency Vitamin D  levels to be  checked as part of fasting labs. Vitamin D  supplementation to be resumed if levels are low. - Order vitamin D  level as part of fasting labs. - Resume vitamin D  supplementation if levels are low perhaps at OTC dose.   Other fatigue Endorses less SOB and improvement in energy levels overall with significant weight loss.  Plan Recheck B 12, CBC and TSH along with labs as noted.,. and treat accordingly.  Vitals Temp: 98.5 F (36.9 C) BP: (!) 160/78 Pulse Rate: 68 SpO2: 99 %   Anthropometric Measurements Height: 5' 2 (1.575 m) Weight: 253 lb (114.8 kg) BMI (Calculated): 46.26 Weight at Last Visit: 266 lb Weight Lost Since Last Visit: 13 lb Weight Gained Since Last Visit: 0 Starting Weight: 315 lb Total Weight Loss (lbs): 64 lb (29 kg)   Body Composition  Body Fat %: 50.8 % Fat Mass (lbs): 128.6 lbs Muscle Mass (lbs): 118.2 lbs Total Body Water (lbs): 88.2 lbs Visceral Fat Rating : 17   Other Clinical Data Fasting: yes Labs: yes Today's Visit #: 16 Starting Date:  11/18/22     ASSESSMENT AND PLAN:  Diet: Amy Cardenas is currently in the action stage of change. As such, her goal is to continue with weight loss efforts. She has agreed to Category 3 Plan.  Exercise: Amy Cardenas has been instructed to work up to a goal of 150 minutes of combined cardio and strengthening exercise per week for weight loss and overall health benefits.   Behavior Modification:  We discussed the following Behavioral Modification Strategies today: increasing lean protein intake, decreasing simple carbohydrates, increasing vegetables, increase H2O intake, increase high fiber foods, no skipping meals, meal planning and cooking strategies, holiday eating strategies, avoiding temptations, and planning for success. We discussed various medication options to help Amy Cardenas with her weight loss efforts and we both agreed to continue current treatment plan.  Return in about 4 weeks (around 05/24/2024).SABRA She was informed of the importance of frequent follow up visits to maximize her success with intensive lifestyle modifications for her multiple health conditions.  Attestation Statements:   Reviewed by clinician on day of visit: allergies, medications, problem list, medical history, surgical history, family history, social history, and previous encounter notes.   Time spent on visit including pre-visit chart review and post-visit care and charting was 28 minutes.    Romano Stigger, PA-C

## 2024-04-26 ENCOUNTER — Encounter (INDEPENDENT_AMBULATORY_CARE_PROVIDER_SITE_OTHER): Payer: Self-pay | Admitting: Physician Assistant

## 2024-04-26 ENCOUNTER — Ambulatory Visit (INDEPENDENT_AMBULATORY_CARE_PROVIDER_SITE_OTHER): Admitting: Physician Assistant

## 2024-04-26 ENCOUNTER — Other Ambulatory Visit (HOSPITAL_COMMUNITY): Payer: Self-pay

## 2024-04-26 ENCOUNTER — Other Ambulatory Visit: Payer: Self-pay

## 2024-04-26 VITALS — BP 160/78 | HR 68 | Temp 98.5°F | Ht 62.0 in | Wt 253.0 lb

## 2024-04-26 DIAGNOSIS — R5383 Other fatigue: Secondary | ICD-10-CM | POA: Diagnosis not present

## 2024-04-26 DIAGNOSIS — E785 Hyperlipidemia, unspecified: Secondary | ICD-10-CM | POA: Diagnosis not present

## 2024-04-26 DIAGNOSIS — E1159 Type 2 diabetes mellitus with other circulatory complications: Secondary | ICD-10-CM | POA: Diagnosis not present

## 2024-04-26 DIAGNOSIS — I152 Hypertension secondary to endocrine disorders: Secondary | ICD-10-CM | POA: Diagnosis not present

## 2024-04-26 DIAGNOSIS — Z7985 Long-term (current) use of injectable non-insulin antidiabetic drugs: Secondary | ICD-10-CM

## 2024-04-26 DIAGNOSIS — E559 Vitamin D deficiency, unspecified: Secondary | ICD-10-CM | POA: Diagnosis not present

## 2024-04-26 DIAGNOSIS — E1169 Type 2 diabetes mellitus with other specified complication: Secondary | ICD-10-CM | POA: Diagnosis not present

## 2024-04-26 DIAGNOSIS — Z6841 Body Mass Index (BMI) 40.0 and over, adult: Secondary | ICD-10-CM

## 2024-04-26 DIAGNOSIS — K5900 Constipation, unspecified: Secondary | ICD-10-CM

## 2024-04-26 DIAGNOSIS — Z7984 Long term (current) use of oral hypoglycemic drugs: Secondary | ICD-10-CM

## 2024-04-26 MED ORDER — CARVEDILOL 25 MG PO TABS
25.0000 mg | ORAL_TABLET | Freq: Two times a day (BID) | ORAL | 3 refills | Status: DC
  Filled 2024-04-26 – 2024-05-12 (×2): qty 60, 30d supply, fill #0
  Filled 2024-06-15: qty 60, 30d supply, fill #1
  Filled 2024-07-20: qty 60, 30d supply, fill #2
  Filled 2024-08-14: qty 60, 30d supply, fill #3

## 2024-04-26 MED ORDER — TIRZEPATIDE 10 MG/0.5ML ~~LOC~~ SOAJ
10.0000 mg | SUBCUTANEOUS | 0 refills | Status: DC
  Filled 2024-04-26: qty 6, 84d supply, fill #0

## 2024-04-26 MED ORDER — ROSUVASTATIN CALCIUM 20 MG PO TABS
20.0000 mg | ORAL_TABLET | Freq: Every day | ORAL | 2 refills | Status: DC
  Filled 2024-04-26 – 2024-08-14 (×3): qty 90, 90d supply, fill #0

## 2024-04-26 MED ORDER — METFORMIN HCL ER 500 MG PO TB24
500.0000 mg | ORAL_TABLET | Freq: Two times a day (BID) | ORAL | 2 refills | Status: DC
  Filled 2024-04-26 – 2024-05-12 (×2): qty 60, 30d supply, fill #0
  Filled 2024-06-15: qty 60, 30d supply, fill #1

## 2024-04-27 ENCOUNTER — Ambulatory Visit (INDEPENDENT_AMBULATORY_CARE_PROVIDER_SITE_OTHER): Payer: Self-pay | Admitting: Physician Assistant

## 2024-04-27 LAB — CMP14+EGFR
ALT: 16 IU/L (ref 0–32)
AST: 15 IU/L (ref 0–40)
Albumin: 4.2 g/dL (ref 3.8–4.9)
Alkaline Phosphatase: 59 IU/L (ref 44–121)
BUN/Creatinine Ratio: 18 (ref 9–23)
BUN: 17 mg/dL (ref 6–24)
Bilirubin Total: 0.9 mg/dL (ref 0.0–1.2)
CO2: 23 mmol/L (ref 20–29)
Calcium: 9.3 mg/dL (ref 8.7–10.2)
Chloride: 102 mmol/L (ref 96–106)
Creatinine, Ser: 0.92 mg/dL (ref 0.57–1.00)
Globulin, Total: 2.6 g/dL (ref 1.5–4.5)
Glucose: 114 mg/dL — ABNORMAL HIGH (ref 70–99)
Potassium: 4.3 mmol/L (ref 3.5–5.2)
Sodium: 141 mmol/L (ref 134–144)
Total Protein: 6.8 g/dL (ref 6.0–8.5)
eGFR: 75 mL/min/1.73 (ref >59)

## 2024-04-27 LAB — HEMOGLOBIN A1C
Est. average glucose Bld gHb Est-mCnc: 123 mg/dL
Hgb A1c MFr Bld: 5.9 % — ABNORMAL HIGH (ref 4.8–5.6)

## 2024-04-27 LAB — CBC WITH DIFFERENTIAL/PLATELET
Basophils Absolute: 0 x10E3/uL (ref 0.0–0.2)
Basos: 1 %
EOS (ABSOLUTE): 0.1 x10E3/uL (ref 0.0–0.4)
Eos: 2 %
Hematocrit: 36.5 % (ref 34.0–46.6)
Hemoglobin: 10.9 g/dL — ABNORMAL LOW (ref 11.1–15.9)
Immature Grans (Abs): 0 x10E3/uL (ref 0.0–0.1)
Immature Granulocytes: 0 %
Lymphocytes Absolute: 2 x10E3/uL (ref 0.7–3.1)
Lymphs: 33 %
MCH: 22.5 pg — ABNORMAL LOW (ref 26.6–33.0)
MCHC: 29.9 g/dL — ABNORMAL LOW (ref 31.5–35.7)
MCV: 75 fL — ABNORMAL LOW (ref 79–97)
Monocytes Absolute: 0.5 x10E3/uL (ref 0.1–0.9)
Monocytes: 8 %
Neutrophils Absolute: 3.5 x10E3/uL (ref 1.4–7.0)
Neutrophils: 56 %
Platelets: 337 x10E3/uL (ref 150–450)
RBC: 4.84 x10E6/uL (ref 3.77–5.28)
RDW: 16.2 % — ABNORMAL HIGH (ref 11.7–15.4)
WBC: 6 x10E3/uL (ref 3.4–10.8)

## 2024-04-27 LAB — VITAMIN B12: Vitamin B-12: 633 pg/mL (ref 232–1245)

## 2024-04-27 LAB — LIPID PANEL WITH LDL/HDL RATIO
Cholesterol, Total: 113 mg/dL (ref 100–199)
HDL: 41 mg/dL (ref >39)
LDL Chol Calc (NIH): 53 mg/dL (ref 0–99)
LDL/HDL Ratio: 1.3 ratio (ref 0.0–3.2)
Triglycerides: 98 mg/dL (ref 0–149)
VLDL Cholesterol Cal: 19 mg/dL (ref 5–40)

## 2024-04-27 LAB — INSULIN, RANDOM: INSULIN: 7.1 u[IU]/mL (ref 2.6–24.9)

## 2024-04-27 LAB — TSH: TSH: 2.05 u[IU]/mL (ref 0.450–4.500)

## 2024-04-27 LAB — VITAMIN D 25 HYDROXY (VIT D DEFICIENCY, FRACTURES): Vit D, 25-Hydroxy: 34.6 ng/mL (ref 30.0–100.0)

## 2024-05-12 ENCOUNTER — Other Ambulatory Visit: Payer: Self-pay

## 2024-05-12 ENCOUNTER — Other Ambulatory Visit (HOSPITAL_COMMUNITY): Payer: Self-pay

## 2024-05-31 ENCOUNTER — Encounter (INDEPENDENT_AMBULATORY_CARE_PROVIDER_SITE_OTHER): Payer: Self-pay | Admitting: Physician Assistant

## 2024-05-31 ENCOUNTER — Ambulatory Visit (INDEPENDENT_AMBULATORY_CARE_PROVIDER_SITE_OTHER): Admitting: Physician Assistant

## 2024-05-31 VITALS — BP 161/81 | HR 69 | Temp 98.2°F | Ht 62.0 in | Wt 254.0 lb

## 2024-05-31 DIAGNOSIS — E785 Hyperlipidemia, unspecified: Secondary | ICD-10-CM

## 2024-05-31 DIAGNOSIS — I152 Hypertension secondary to endocrine disorders: Secondary | ICD-10-CM | POA: Diagnosis not present

## 2024-05-31 DIAGNOSIS — E1159 Type 2 diabetes mellitus with other circulatory complications: Secondary | ICD-10-CM | POA: Diagnosis not present

## 2024-05-31 DIAGNOSIS — N951 Menopausal and female climacteric states: Secondary | ICD-10-CM

## 2024-05-31 DIAGNOSIS — E1169 Type 2 diabetes mellitus with other specified complication: Secondary | ICD-10-CM | POA: Diagnosis not present

## 2024-05-31 DIAGNOSIS — Z6841 Body Mass Index (BMI) 40.0 and over, adult: Secondary | ICD-10-CM

## 2024-05-31 DIAGNOSIS — Z7984 Long term (current) use of oral hypoglycemic drugs: Secondary | ICD-10-CM

## 2024-05-31 DIAGNOSIS — D500 Iron deficiency anemia secondary to blood loss (chronic): Secondary | ICD-10-CM

## 2024-05-31 DIAGNOSIS — Z7985 Long-term (current) use of injectable non-insulin antidiabetic drugs: Secondary | ICD-10-CM

## 2024-05-31 DIAGNOSIS — E559 Vitamin D deficiency, unspecified: Secondary | ICD-10-CM

## 2024-05-31 MED ORDER — VITAMIN D3 50 MCG (2000 UT) PO CAPS: 2000.0000 [IU] | ORAL_CAPSULE | Freq: Every day | ORAL | Status: DC

## 2024-05-31 NOTE — Progress Notes (Signed)
 SUBJECTIVE: Discussed the use of AI scribe software for clinical note transcription with the patient, who gave verbal consent to proceed.  Chief Complaint: Obesity  Interim History: She is up 1 lb since her last visit.  Down 61 lbs overall TBW loss of 19.4%  Amy is here to discuss her progress with her obesity treatment plan. She is on the Category 3 Plan and states she is following her eating plan approximately 90 % of the time but got off track while traveling over the past few weeks. She states she is not exercising.  Amy Cardenas is a 53 year old female with obesity who presents for follow-up on her obesity treatment plan.  She is adhering to her category three obesity treatment plan approximately 90% of the time but has not been engaging in physical exercise. There is a weight gain of one pound since her last visit. She is currently on Mounjaro  10 mg weekly and metformin  500 mg twice daily for type 2 diabetes.  Her blood pressure at home ranges between 132-135/85-90 mmHg. She is on carvedilol  25 mg twice daily for hypertension management. She notes that her blood pressure is not very high when checked at home, but does seem to go up with her HWW appts.  She does not feel very hungry but acknowledges consuming unhealthy foods and not planning or prepping meals. Recent travel led to poor dietary choices such as consuming Twix and chips. She is not checking her blood sugars as frequently as she should but notes she hasn't been consuming a lot of sugar. She acknowledges not choosing the right foods.  She reports not sleeping as well as before, attributing it to a busy schedule involving travel and family obligations. She has been traveling to [place]  to assist her sick uncle, which has affected her sleep and routine.  She experiences some constipation, which she attributes to insufficient water intake. She has not been consuming as many fruits, vegetables, or protein shakes as  before.  She is on Crestor  20 mg daily for hyperlipidemia and ergocalciferol  50,000 units once weekly for vitamin D  deficiency. Her cholesterol levels have improved significantly with a total cholesterol of 113, triglycerides 98, HDL 41, and LDL 53.  She has a history of anemia and reports her hemoglobin is low at 10.9. She has not had a colonoscopy( she did have a negative Cologard result) and acknowledges a family history of anemia. She was previously on iron supplements but stopped due to constipation.  She is taking a supplement called MENOS for menopause symptoms, which has helped with night sweats and sleep.  OBJECTIVE: Visit Diagnoses: Problem List Items Addressed This Visit     Diabetes mellitus (HCC) - Primary   Vitamin D  deficiency   Relevant Medications   Cholecalciferol (VITAMIN D3) 50 MCG (2000 UT) capsule   Iron deficiency anemia   Hypertension associated with diabetes (HCC)   Hyperlipidemia associated with type 2 diabetes mellitus (HCC)   Morbid obesity (HCC) with starting BMI 57 (Chronic)   Other Visit Diagnoses       Menopausal symptoms         BMI 45.0-49.9, adult (HCC) Current BMI 46.6         Obesity Obesity management is ongoing with a category three plan. Adherence is approximately 90%, but there is a lack of exercise. Weight has increased by one pound since the last visit. Mounjaro  10 mg weekly is being used as part of the treatment plan. She has experienced constipation,  likely due to inadequate water intake. - Encourage increased water intake to address constipation. - Encourage resumption of exercise, including walking and use of a peddler or standing desk at work. - Encourage meal planning and preparation to improve dietary choices. - Encourage intake of fruits, vegetables, and protein-rich foods.  Type 2 diabetes mellitus Type 2 diabetes is being managed with Mounjaro  10 mg weekly and metformin  500 mg twice daily. Recent glucose level was 114, and A1c is  5.9, indicating significant improvement from previous levels. She is not regularly checking blood sugars but reports not consuming excessive sugar. Lab Results  Component Value Date   HGBA1C 5.9 (H) 04/26/2024   HGBA1C 6.1 (H) 07/03/2023   HGBA1C 6.4 (H) 03/17/2023   Lab Results  Component Value Date   MICROALBUR 7.0 (H) 10/11/2019   LDLCALC 53 04/26/2024   CREATININE 0.92 04/26/2024   She is working  on nutrition plan to decrease simple carbohydrates, increase lean proteins and exercise to promote weight loss and improve glycemic control .  - Continue Mounjaro  10 mg weekly and metformin  500 mg twice daily.- no refills needed this visit.  - Encourage regular blood sugar monitoring. - Reinforce dietary management to maintain glucose control.  Hyperlipidemia Hyperlipidemia is being managed with Crestor  20 mg daily. Recent lipid panel shows total cholesterol at 113, triglycerides at 98, HDL at 41, and LDL at 53, indicating significant improvement. - Continue Crestor  20 mg daily. - Encourage continued dietary modifications to maintain lipid levels. Continue to work on nutrition plan -decreasing simple carbohydrates, increasing lean proteins, decreasing saturated fats and cholesterol , avoiding trans fats and exercise as able to promote weight loss, improve lipids and decrease cardiovascular risks.   Hypertension Hypertension is being managed with carvedilol  25 mg twice daily. Home blood pressure readings range from 132-135/85-90, indicating controlled blood pressure. - Continue carvedilol  25 mg twice daily. - Encourage regular monitoring of blood pressure at home. Continue to work on nutrition plan to promote weight loss and improve BP control.    Iron deficiency anemia Chronic iron deficiency anemia is present, likely due to menstrual blood loss. Hemoglobin is 10.9, nearing normal range but not improving significantly. There is a family history of anemia. She has not had a colonoscopy,  which is recommended given her age and anemia. Discussed the importance of ruling out gastrointestinal causes, including colorectal cancer, due to increased risk in African Americans and family history. - Refer to primary care physician for gastroenterology consultation and potential colonoscopy. - Consider prenatal vitamin with iron if tolerated. Consider hematology evaluation  Vitamin D  deficiency Vitamin D  deficiency is being managed previously with ergocalciferol  50,000 units once weekly. Recent vitamin D  level was 34.6. No N/V or muscle weakness with vitamin D  Low vitamin D  levels can be associated with adiposity and may result in leptin resistance and weight gain. Also associated with fatigue.  Currently on vitamin D  supplementation without any adverse effects such as nausea, vomiting or muscle weakness.  - Switch to over-the-counter vitamin D  supplement, 2000 units once daily with food.  Menopausal symptoms Menopausal symptoms include night sweats and hot flashes. She is taking a supplement called MENO, which contains maca root and other natural ingredients, and reports improvement in symptoms. - Continue MENO supplement as it is helping with menopausal symptoms.  Vitals Temp: 98.2 F (36.8 C) BP: (!) 161/81 Pulse Rate: 69 SpO2: 100 %   Anthropometric Measurements Height: 5' 2 (1.575 m) Weight: 254 lb (115.2 kg) BMI (Calculated): 46.45 Weight at  Last Visit: 253lb Weight Lost Since Last Visit: 0 Weight Gained Since Last Visit: 1lb Starting Weight: 315lb Total Weight Loss (lbs): 61 lb (27.7 kg)   Body Composition  Body Fat %: 52.9 % Fat Mass (lbs): 134.8 lbs Muscle Mass (lbs): 114 lbs Visceral Fat Rating : 18   Other Clinical Data Fasting: no Labs: no Today's Visit #: 17 Starting Date: 11/18/22     ASSESSMENT AND PLAN:  Diet: Amy is currently in the action stage of change. As such, her goal is to get back to weight loss efforts. She has agreed to Category  3 Plan.  Exercise: Amy has been instructed to work up to a goal of 150 minutes of combined cardio and strengthening exercise per week, to try a geriatric exercise plan, and that some exercise is better than none for weight loss and overall health benefits.   Behavior Modification:  We discussed the following Behavioral Modification Strategies today: increasing lean protein intake, decreasing simple carbohydrates, increasing vegetables, increase H2O intake, increase high fiber foods, no skipping meals, meal planning and cooking strategies, better snacking choices, avoiding temptations, and planning for success. We discussed various medication options to help Amy with her weight loss efforts and we both agreed to continue current treatment plan.  Return in about 6 weeks (around 07/12/2024).SABRA She was informed of the importance of frequent follow up visits to maximize her success with intensive lifestyle modifications for her multiple health conditions.  Attestation Statements:   Reviewed by clinician on day of visit: allergies, medications, problem list, medical history, surgical history, family history, social history, and previous encounter notes.   Time spent on visit including pre-visit chart review and post-visit care and charting was 41 minutes.    Nilo Fallin, PA-C

## 2024-06-15 ENCOUNTER — Other Ambulatory Visit (HOSPITAL_COMMUNITY): Payer: Self-pay

## 2024-07-06 ENCOUNTER — Other Ambulatory Visit (INDEPENDENT_AMBULATORY_CARE_PROVIDER_SITE_OTHER): Payer: Self-pay | Admitting: Physician Assistant

## 2024-07-06 ENCOUNTER — Other Ambulatory Visit (HOSPITAL_COMMUNITY): Payer: Self-pay

## 2024-07-06 DIAGNOSIS — E1169 Type 2 diabetes mellitus with other specified complication: Secondary | ICD-10-CM

## 2024-07-06 MED ORDER — MOUNJARO 10 MG/0.5ML ~~LOC~~ SOAJ
10.0000 mg | SUBCUTANEOUS | 0 refills | Status: DC
  Filled 2024-07-06: qty 6, 84d supply, fill #0

## 2024-07-09 ENCOUNTER — Other Ambulatory Visit (INDEPENDENT_AMBULATORY_CARE_PROVIDER_SITE_OTHER): Payer: Self-pay | Admitting: Physician Assistant

## 2024-07-09 DIAGNOSIS — E1169 Type 2 diabetes mellitus with other specified complication: Secondary | ICD-10-CM

## 2024-07-11 ENCOUNTER — Other Ambulatory Visit (HOSPITAL_COMMUNITY): Payer: Self-pay

## 2024-07-13 NOTE — Progress Notes (Unsigned)
 SUBJECTIVE: Discussed the use of AI scribe software for clinical note transcription with the patient, who gave verbal consent to proceed.  Chief Complaint: Obesity  Interim History: She is up 2 lbs since her last visit.  Down 59 lbs overall TBW loss of 18.7%  Amy Cardenas is here to discuss her progress with her obesity treatment plan. She is on the Category 3 Plan and states she is following her eating plan approximately 70 % of the time. She states she is not exercising regularly.  Amy Cardenas is a 53 year old female with obesity who presents for follow-up of her obesity treatment plan.  She is adhering to her obesity treatment plan 70% of the time. Despite a recent weight gain of two pounds, she has achieved a total weight loss of 59 pounds. Her diet now includes more whole foods, but she is not meeting the recommended intake of protein or water consistently. She skips meals at times due to her busy schedule and does not consistently achieve 7 to 9 hours of sleep per night. Family obligations have hindered her ability to exercise and maintain her meal preparation routine. Due to time constraints, she occasionally consumes fast food but attempts to meal prep on Sundays.  Currently, her body composition is 52% adipose. She experiences increased hunger around her menstrual cycle, which she manages with protein shakes, string cheese, or apples. Her schedule sometimes leads to late-night eating.  Her current medications include Mounjaro  10 mg, which she has been taking for over five months, metformin , and vitamin D3. She uses Advair  as needed when ill. She lives in [place] and manages meal preparation for her family, including her husband, mother, daughter, and grandson. She has explored various meal options, such as [employer] prepared meats and cottage cheese-based dishes, to support her dietary goals.  OBJECTIVE: Visit Diagnoses: Problem List Items Addressed This Visit     Diabetes mellitus  (HCC) - Primary   Relevant Medications   metFORMIN  (GLUCOPHAGE -XR) 500 MG 24 hr tablet   tirzepatide  (MOUNJARO ) 12.5 MG/0.5ML Pen   Vitamin D  deficiency   Relevant Medications   Cholecalciferol (VITAMIN D3) 50 MCG (2000 UT) capsule   Hypertension associated with diabetes (HCC)   Relevant Medications   metFORMIN  (GLUCOPHAGE -XR) 500 MG 24 hr tablet   tirzepatide  (MOUNJARO ) 12.5 MG/0.5ML Pen   Hyperlipidemia associated with type 2 diabetes mellitus (HCC)   Relevant Medications   metFORMIN  (GLUCOPHAGE -XR) 500 MG 24 hr tablet   tirzepatide  (MOUNJARO ) 12.5 MG/0.5ML Pen   Morbid obesity (HCC) with starting BMI 57 (Chronic)   Relevant Medications   metFORMIN  (GLUCOPHAGE -XR) 500 MG 24 hr tablet   tirzepatide  (MOUNJARO ) 12.5 MG/0.5ML Pen   Other Visit Diagnoses       BMI 45.0-49.9, adult (HCC) Current BMI 46.9       Relevant Medications   metFORMIN  (GLUCOPHAGE -XR) 500 MG 24 hr tablet   tirzepatide  (MOUNJARO ) 12.5 MG/0.5ML Pen     Obesity She is on a category three obesity treatment plan, adhering to it 70% of the time. She has gained two pounds since the last visit but has lost a total of 59 pounds. Her body composition shows an increase in muscle mass and a slight decrease in adipose tissue. Current body adipose is at 52%, with a goal of 35% or less.  She reports challenges with meal planning and time management, leading to occasional fast food consumption. She is not meeting protein and water intake recommendations consistently and is not sleeping 7-9 hours nightly. -  Continue category three obesity treatment plan. - Encourage meal planning and preparation and provided strategies to assist and crock pot recipes to help with meal prepping - Consider using [employer] life meal prep services for convenience. - Incorporate crock pot recipes like white chicken chili and skinny enchiladas. - Explore high-protein options like [employer] prepared meats and Barilla high-protein pasta. - Focus on strength  training and cardio exercises, possibly at [employer].  Type 2 diabetes mellitus Currently on 10 mg of Mounjaro  for type 2 diabetes management. She has been on this dose for over five months. There is a consideration to increase the dose to 12.5 mg due to a decrease in appetite control.  She is concerned about the long-term management of her diabetes, especially given her family history of diabetes-related complications. Discussed the possibility of needing medication long-term to manage her diabetes effectively. Lab Results  Component Value Date   HGBA1C 5.9 (H) 04/26/2024   HGBA1C 6.1 (H) 07/03/2023   HGBA1C 6.4 (H) 03/17/2023   Lab Results  Component Value Date   MICROALBUR 7.0 (H) 10/11/2019   LDLCALC 53 04/26/2024   CREATININE 0.92 04/26/2024  She is working  on nutrition plan to decrease simple carbohydrates, increase lean proteins and exercise to promote weight loss and improve glycemic control .  - Increase Mounjaro  to 12.5 mg for one month and assess response. - Refill metformin  500 mg BID prescription. - Discuss the importance of not skipping meals to maintain metabolism. - Monitor for any adverse effects or changes in appetite control. Meds ordered this encounter  Medications   metFORMIN  (GLUCOPHAGE -XR) 500 MG 24 hr tablet    Sig: Take 1 tablet (500 mg total) by mouth 2 (two) times daily with a meal.    Dispense:  60 tablet    Refill:  2   Cholecalciferol (VITAMIN D3) 50 MCG (2000 UT) capsule    Sig: Take 1 capsule (2,000 Units total) by mouth daily.    Dispense:  30 capsule    Refill:  11   tirzepatide  (MOUNJARO ) 12.5 MG/0.5ML Pen    Sig: Inject 12.5 mg into the skin once a week.    Dispense:  2 mL    Refill:  2    Hypertension Hypertension improved, asymptomatic, reasonably well controlled, no significant medication side effects noted, and needs further observation.  Medication(s): carvedilol  25 mg BID  BP Readings from Last 3 Encounters:  07/14/24 136/80   05/31/24 (!) 161/81  04/26/24 (!) 160/78   Lab Results  Component Value Date   CREATININE 0.92 04/26/2024   CREATININE 0.99 07/03/2023   CREATININE 0.98 03/17/2023   No results found for: GFR  Plan: Continue Carvedilol  25 mg BID- no refill needed today.  Continue to work on nutrition plan to promote weight loss and improve BP control.  No hypotension with current weight loss with GLP medication- monitor   Hyperlipidemia LDL is at goal. Medication(s): Crestor  20 mg daily. No SE and Mounjaro  Cardiovascular risk factors: diabetes mellitus, dyslipidemia, hypertension, obesity (BMI >= 30 kg/m2), and sedentary lifestyle  Lab Results  Component Value Date   CHOL 113 04/26/2024   HDL 41 04/26/2024   LDLCALC 53 04/26/2024   TRIG 98 04/26/2024   CHOLHDL 5.7 (H) 03/17/2023   CHOLHDL 6.2 (H) 11/18/2022   CHOLHDL 4.9 07/27/2020   Lab Results  Component Value Date   ALT 16 04/26/2024   AST 15 04/26/2024   ALKPHOS 59 04/26/2024   BILITOT 0.9 04/26/2024   The ASCVD Risk  score (Arnett DK, et al., 2019) failed to calculate for the following reasons:   The valid total cholesterol range is 130 to 320 mg/dL  Plan: Continue Crestor  20 mg daily.  Continue to work on nutrition plan -decreasing simple carbohydrates, increasing lean proteins, decreasing saturated fats and cholesterol , avoiding trans fats and exercise as able to promote weight loss, improve lipids and decrease cardiovascular risks.     Vitamin D  deficiency On D 3 2000 units daily.  No SE Low vitamin D  levels can be associated with adiposity and may result in leptin resistance and weight gain. Also associated with fatigue.  Currently on vitamin D  supplementation without any adverse effects such as nausea, vomiting or muscle weakness.   Last vitamin D  Lab Results  Component Value Date   VD25OH 34.6 04/26/2024   Plan:  - Continue vitamin D3 supplementation at 2000 units daily.  Vitals Temp: 98.7 F (37.1  C) BP: 136/80 Pulse Rate: 76 SpO2: 99 %   Anthropometric Measurements Height: 5' 2 (1.575 m) Weight: 256 lb (116.1 kg) BMI (Calculated): 46.81 Weight at Last Visit: 254lb Weight Lost Since Last Visit: 0lb Weight Gained Since Last Visit: 2lb Starting Weight: 315lb Total Weight Loss (lbs): 59 lb (26.8 kg)   Body Composition  Body Fat %: 52.4 % Fat Mass (lbs): 134.4 lbs Muscle Mass (lbs): 116 lbs Total Body Water (lbs): 93 lbs Visceral Fat Rating : 18   Other Clinical Data Fasting: No Labs: No Today's Visit #: 18 Starting Date: 11/18/22     ASSESSMENT AND PLAN:  Diet: Amy Cardenas is currently in the action stage of change. As such, her goal is to continue with weight loss efforts. She has agreed to Category 3 Plan.  Exercise: Amy Cardenas has been instructed to try a geriatric exercise plan and that some exercise is better than none for weight loss and overall health benefits.   Behavior Modification:  We discussed the following Behavioral Modification Strategies today: increasing lean protein intake, decreasing simple carbohydrates, increasing vegetables, increase H2O intake, increase high fiber foods, no skipping meals, meal planning and cooking strategies, avoiding temptations, and planning for success. We discussed various medication options to help Amy Cardenas with her weight loss efforts and we both agreed to .  Return in about 6 weeks (around 08/25/2024).SABRA She was informed of the importance of frequent follow up visits to maximize her success with intensive lifestyle modifications for her multiple health conditions.  Attestation Statements:   Reviewed by clinician on day of visit: allergies, medications, problem list, medical history, surgical history, family history, social history, and previous encounter notes.   Time spent on visit including pre-visit chart review and post-visit care and charting was 43 minutes.    Mackenzey Crownover, PA-C

## 2024-07-14 ENCOUNTER — Other Ambulatory Visit (HOSPITAL_COMMUNITY): Payer: Self-pay

## 2024-07-14 ENCOUNTER — Ambulatory Visit (INDEPENDENT_AMBULATORY_CARE_PROVIDER_SITE_OTHER): Admitting: Physician Assistant

## 2024-07-14 ENCOUNTER — Other Ambulatory Visit: Payer: Self-pay

## 2024-07-14 ENCOUNTER — Encounter (INDEPENDENT_AMBULATORY_CARE_PROVIDER_SITE_OTHER): Payer: Self-pay | Admitting: Physician Assistant

## 2024-07-14 VITALS — BP 136/80 | HR 76 | Temp 98.7°F | Ht 62.0 in | Wt 256.0 lb

## 2024-07-14 DIAGNOSIS — E1159 Type 2 diabetes mellitus with other circulatory complications: Secondary | ICD-10-CM

## 2024-07-14 DIAGNOSIS — E559 Vitamin D deficiency, unspecified: Secondary | ICD-10-CM

## 2024-07-14 DIAGNOSIS — N951 Menopausal and female climacteric states: Secondary | ICD-10-CM

## 2024-07-14 DIAGNOSIS — I152 Hypertension secondary to endocrine disorders: Secondary | ICD-10-CM | POA: Diagnosis not present

## 2024-07-14 DIAGNOSIS — Z6841 Body Mass Index (BMI) 40.0 and over, adult: Secondary | ICD-10-CM

## 2024-07-14 DIAGNOSIS — D500 Iron deficiency anemia secondary to blood loss (chronic): Secondary | ICD-10-CM

## 2024-07-14 DIAGNOSIS — Z7985 Long-term (current) use of injectable non-insulin antidiabetic drugs: Secondary | ICD-10-CM

## 2024-07-14 DIAGNOSIS — E1169 Type 2 diabetes mellitus with other specified complication: Secondary | ICD-10-CM | POA: Diagnosis not present

## 2024-07-14 DIAGNOSIS — E785 Hyperlipidemia, unspecified: Secondary | ICD-10-CM | POA: Diagnosis not present

## 2024-07-14 DIAGNOSIS — Z7984 Long term (current) use of oral hypoglycemic drugs: Secondary | ICD-10-CM

## 2024-07-14 MED ORDER — VITAMIN D3 50 MCG (2000 UT) PO CAPS
2000.0000 [IU] | ORAL_CAPSULE | Freq: Every day | ORAL | 11 refills | Status: AC
  Filled 2024-07-14: qty 30, 30d supply, fill #0
  Filled 2024-08-14: qty 30, 30d supply, fill #1
  Filled 2024-09-27: qty 30, 30d supply, fill #2
  Filled 2024-10-29: qty 30, 30d supply, fill #3

## 2024-07-14 MED ORDER — TIRZEPATIDE 12.5 MG/0.5ML ~~LOC~~ SOAJ
12.5000 mg | SUBCUTANEOUS | 2 refills | Status: DC
  Filled 2024-07-14: qty 2, 28d supply, fill #0
  Filled 2024-08-14: qty 2, 28d supply, fill #1

## 2024-07-14 MED ORDER — METFORMIN HCL ER 500 MG PO TB24
500.0000 mg | ORAL_TABLET | Freq: Two times a day (BID) | ORAL | 2 refills | Status: DC
  Filled 2024-07-14: qty 60, 30d supply, fill #0
  Filled 2024-08-14: qty 60, 30d supply, fill #1

## 2024-07-15 ENCOUNTER — Other Ambulatory Visit (HOSPITAL_COMMUNITY): Payer: Self-pay

## 2024-07-21 ENCOUNTER — Other Ambulatory Visit: Payer: Self-pay

## 2024-07-29 ENCOUNTER — Other Ambulatory Visit (HOSPITAL_COMMUNITY): Payer: Self-pay

## 2024-08-16 ENCOUNTER — Other Ambulatory Visit: Payer: Self-pay

## 2024-08-16 ENCOUNTER — Other Ambulatory Visit (HOSPITAL_COMMUNITY): Payer: Self-pay

## 2024-08-17 ENCOUNTER — Other Ambulatory Visit: Payer: Self-pay

## 2024-08-24 ENCOUNTER — Ambulatory Visit (INDEPENDENT_AMBULATORY_CARE_PROVIDER_SITE_OTHER): Payer: Self-pay | Admitting: Physician Assistant

## 2024-09-06 NOTE — Progress Notes (Unsigned)
 SUBJECTIVE: Discussed the use of AI scribe software for clinical note transcription with the patient, who gave verbal consent to proceed.  Chief Complaint: Obesity  Interim History: She is down 1 lb since her last visit Down 60 lbs overall TBW loss of 19.0%  Amy Cardenas is here to discuss her progress with her obesity treatment plan. She is on the Category 3 Plan and states she is following her eating plan approximately 80 % of the time. She states she is not exercising.  Amy Cardenas is a 53 year old female who presents for follow-up on her obesity treatment plan.  She has lost sixty pounds, which is nineteen percent of her body weight. Recently lost one pound and thought she had gained weight. Challenges include meal preparation and planning, especially during busy periods like her daughter's basketball season. She has started meal prepping with oats, protein powder, and fruit. She joined [employer] but has only attended twice due to her schedule and plans to go regularly once her daughter's basketball season is finished.  Her blood pressure is elevated today 159/95, but she notes BP are better at home - usually around 135/85 mmHg, and it tends to rise when she is upset or tired. She is currently taking carvedilol  without SE.   Regarding her type 2 diabetes, she is on metformin  500 mg BID and Mounjaro  12.5 mg weekly. She does not check her blood sugars and has no issues with hypoglycemia. She experiences increased cravings for sweets around her menstrual cycle, particularly at work where snacks are readily available.  No gastrointestinal issues such as constipation or diarrhea. She reports only mild, manageable nausea at times, but does not limit her. She is on rosuvastatin  for hyperlipidemia with no side effects.  Socially, she is involved in caring for her aunt who recently broke her leg and is recovering from surgery. She alternates nights with her son to stay with her aunt, who is  experiencing depression and orders food frequently. Her daughter is active in basketball and she tries to attend her games. 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  OBJECTIVE: Visit Diagnoses: Problem List Items Addressed This Visit     Diabetes mellitus (HCC) - Primary   Relevant Medications   rosuvastatin  (CRESTOR ) 20 MG tablet   metFORMIN  (GLUCOPHAGE -XR) 500 MG 24 hr tablet   tirzepatide  (MOUNJARO ) 12.5 MG/0.5ML Pen   Hypertension associated with diabetes (HCC)   Relevant Medications   carvedilol  (COREG ) 25 MG tablet   rosuvastatin  (CRESTOR ) 20 MG tablet   metFORMIN  (GLUCOPHAGE -XR) 500 MG 24 hr tablet   tirzepatide  (MOUNJARO ) 12.5 MG/0.5ML Pen   Hyperlipidemia associated with type 2 diabetes mellitus (HCC)   Relevant Medications   carvedilol  (COREG ) 25 MG tablet   rosuvastatin  (CRESTOR ) 20 MG tablet   metFORMIN  (GLUCOPHAGE -XR) 500 MG 24 hr tablet   tirzepatide  (MOUNJARO ) 12.5 MG/0.5ML Pen   Morbid obesity (HCC) with starting BMI 57 (Chronic)   Relevant Medications   metFORMIN  (GLUCOPHAGE -XR) 500 MG 24 hr tablet   tirzepatide  (MOUNJARO ) 12.5 MG/0.5ML Pen   Other Visit Diagnoses       BMI 45.0-49.9, adult (HCC) Current BMI 46.6       Relevant Medications   metFORMIN  (GLUCOPHAGE -XR) 500 MG 24 hr tablet   tirzepatide  (MOUNJARO ) 12.5 MG/0.5ML Pen     Obesity Management is ongoing with a focus on meal planning and preparation. She has lost 60 pounds, approximately  19% of her body weight, but reports increased cravings for sweets, particularly around her menstrual cycle. She is on Mounjaro  12.5 mg and metformin  for primary indication of T2DM. No significant gastrointestinal side effects reported. She is encouraged to continue meal planning and preparation to maintain weight loss. Discussed the importance of lifestyle change rather than dieting. - Continue Mounjaro  12.5 mg weekly - Continue metformin  500 mg  BID - Encouraged meal planning and preparation - Encouraged regular physical activity, such as visiting [employer]  Type 2 diabetes mellitus Managed with Mounjaro  12.5 mg weekly and metformin  500 mg BID. Denies mass in neck, dysphagia, dyspepsia, persistent hoarseness, abdominal pain, or N/V/Constipation or diarrhea. Has annual eye exam. Mood is stable.  . No current blood sugar monitoring as hypoglycemia is unlikely with current medication regimen. No gastrointestinal upset reported with metformin . Lab Results  Component Value Date   HGBA1C 5.9 (H) 04/26/2024   HGBA1C 6.1 (H) 07/03/2023   HGBA1C 6.4 (H) 03/17/2023   Lab Results  Component Value Date   MICROALBUR 7.0 (H) 10/11/2019   LDLCALC 53 04/26/2024   CREATININE 0.92 04/26/2024   INSULIN   Date Value Ref Range Status  04/26/2024 7.1 2.6 - 24.9 uIU/mL Final  ]She is working  on nutrition plan to decrease simple carbohydrates, increase lean proteins and exercise to promote weight loss and improve glycemic control . Continue/refill Mounjaro  12.5 mg weekly - Continue/refill  metformin  500 mg BID Meds ordered this encounter  Medications   carvedilol  (COREG ) 25 MG tablet    Sig: Take 1 tablet (25 mg total) by mouth 2 (two) times daily with a meal.    Dispense:  60 tablet    Refill:  3   rosuvastatin  (CRESTOR ) 20 MG tablet    Sig: Take 1 tablet (20 mg total) by mouth daily.    Dispense:  90 tablet    Refill:  2   metFORMIN  (GLUCOPHAGE -XR) 500 MG 24 hr tablet    Sig: Take 1 tablet (500 mg total) by mouth 2 (two) times daily with a meal.    Dispense:  60 tablet    Refill:  2   tirzepatide  (MOUNJARO ) 12.5 MG/0.5ML Pen    Sig: Inject 12.5 mg into the skin once a week.    Dispense:  2 mL    Refill:  2    Hypertension Blood pressure is slightly elevated at 135/85 mmHg, possibly due to situational factors such as being in the clinic. No significant issues reported with current antihypertensive regimen. BP Readings from Last  3 Encounters:  09/07/24 (!) 159/95  07/14/24 136/80  05/31/24 (!) 161/81   Lab Results  Component Value Date   NA 141 04/26/2024   CL 102 04/26/2024   K 4.3 04/26/2024   CO2 23 04/26/2024   BUN 17 04/26/2024   CREATININE 0.92 04/26/2024   EGFR 75 04/26/2024   CALCIUM  9.3 04/26/2024   ALBUMIN 4.2 04/26/2024   GLUCOSE 114 (H) 04/26/2024   Continue to work on nutrition plan to promote weight loss and improve BP control.  - Continue/refill carvedilol  25 mg BID.  Consider adding hydrochlorothiazide if BP still above goal.   Hyperlipidemia Managed with rosuvastatin . No reported issues with medication. Last lipids Lab Results  Component Value Date   CHOL 113 04/26/2024   HDL 41 04/26/2024   LDLCALC 53 04/26/2024   TRIG 98 04/26/2024   CHOLHDL 5.7 (H) 03/17/2023   Continue to work on nutrition plan -decreasing simple carbohydrates, increasing lean proteins, decreasing saturated  fats and cholesterol , avoiding trans fats and exercise as able to promote weight loss, improve lipids and decrease cardiovascular risks. - Continue/refill rosuvastatin  20 mg daily  General Health Maintenance Discussed the importance of lifestyle changes over dieting. Encouraged to enjoy the holidays without feeling deprived, emphasizing a lifestyle change approach. - Encouraged lifestyle changes over dieting - Encouraged enjoyment of holidays without deprivation  Vitals Temp: 98 F (36.7 C) BP: (!) 159/95 Pulse Rate: 70 SpO2: 99 %   Anthropometric Measurements Height: 5' 2 (1.575 m) Weight: 255 lb (115.7 kg) BMI (Calculated): 46.63 Weight at Last Visit: 256 lb Weight Lost Since Last Visit: 1 lb Weight Gained Since Last Visit: 0 Starting Weight: 315 lb Total Weight Loss (lbs): 60 lb (27.2 kg)   Body Composition  Body Fat %: 53 % Fat Mass (lbs): 135.2 lbs Muscle Mass (lbs): 113.8 lbs Total Body Water (lbs): 92 lbs Visceral Fat Rating : 18   Other Clinical Data Fasting: No Labs:  No Today's Visit #: 19 Starting Date: 11/18/22     ASSESSMENT AND PLAN:  Diet: Magally is currently in the action stage of change. As such, her goal is to continue with weight loss efforts. She has agreed to Category 3 Plan.  Exercise: Teyla has been instructed to work up to a goal of 150 minutes of combined cardio and strengthening exercise per week, to try a geriatric exercise plan, and that some exercise is better than none for weight loss and overall health benefits.   Behavior Modification:  We discussed the following Behavioral Modification Strategies today: increasing lean protein intake, decreasing simple carbohydrates, increasing vegetables, increase H2O intake, increase high fiber foods, meal planning and cooking strategies, holiday eating strategies, avoiding temptations, and planning for success. We discussed various medication options to help Lougenia with her weight loss efforts and we both agreed to continue Mounjaro  12.5 mg weekly and metformin  500 mg BID for T2DM.  Return in about 4 weeks (around 10/05/2024).SABRA She was informed of the importance of frequent follow up visits to maximize her success with intensive lifestyle modifications for her multiple health conditions.  Attestation Statements:   Reviewed by clinician on day of visit: allergies, medications, problem list, medical history, surgical history, family history, social history, and previous encounter notes.   Time spent on visit including pre-visit chart review and post-visit care and charting was 28 minutes.    Ameet Sandy, PA-C

## 2024-09-07 ENCOUNTER — Other Ambulatory Visit (HOSPITAL_COMMUNITY): Payer: Self-pay

## 2024-09-07 ENCOUNTER — Encounter (INDEPENDENT_AMBULATORY_CARE_PROVIDER_SITE_OTHER): Payer: Self-pay | Admitting: Physician Assistant

## 2024-09-07 ENCOUNTER — Ambulatory Visit (INDEPENDENT_AMBULATORY_CARE_PROVIDER_SITE_OTHER): Admitting: Physician Assistant

## 2024-09-07 ENCOUNTER — Other Ambulatory Visit: Payer: Self-pay

## 2024-09-07 VITALS — BP 159/95 | HR 70 | Temp 98.0°F | Ht 62.0 in | Wt 255.0 lb

## 2024-09-07 DIAGNOSIS — K5900 Constipation, unspecified: Secondary | ICD-10-CM

## 2024-09-07 DIAGNOSIS — E785 Hyperlipidemia, unspecified: Secondary | ICD-10-CM | POA: Diagnosis not present

## 2024-09-07 DIAGNOSIS — E1159 Type 2 diabetes mellitus with other circulatory complications: Secondary | ICD-10-CM | POA: Diagnosis not present

## 2024-09-07 DIAGNOSIS — I152 Hypertension secondary to endocrine disorders: Secondary | ICD-10-CM | POA: Diagnosis not present

## 2024-09-07 DIAGNOSIS — D500 Iron deficiency anemia secondary to blood loss (chronic): Secondary | ICD-10-CM

## 2024-09-07 DIAGNOSIS — E1169 Type 2 diabetes mellitus with other specified complication: Secondary | ICD-10-CM

## 2024-09-07 DIAGNOSIS — Z6841 Body Mass Index (BMI) 40.0 and over, adult: Secondary | ICD-10-CM

## 2024-09-07 DIAGNOSIS — E559 Vitamin D deficiency, unspecified: Secondary | ICD-10-CM

## 2024-09-07 MED ORDER — METFORMIN HCL ER 500 MG PO TB24
500.0000 mg | ORAL_TABLET | Freq: Two times a day (BID) | ORAL | 2 refills | Status: AC
  Filled 2024-09-07 – 2024-09-27 (×2): qty 60, 30d supply, fill #0
  Filled 2024-10-29: qty 60, 30d supply, fill #1

## 2024-09-07 MED ORDER — TIRZEPATIDE 12.5 MG/0.5ML ~~LOC~~ SOAJ
12.5000 mg | SUBCUTANEOUS | 2 refills | Status: AC
  Filled 2024-09-07: qty 2, 28d supply, fill #0
  Filled 2024-10-05: qty 2, 28d supply, fill #1

## 2024-09-07 MED ORDER — CARVEDILOL 25 MG PO TABS
25.0000 mg | ORAL_TABLET | Freq: Two times a day (BID) | ORAL | 3 refills | Status: AC
  Filled 2024-09-07 – 2024-09-27 (×2): qty 60, 30d supply, fill #0
  Filled 2024-10-29: qty 60, 30d supply, fill #1

## 2024-09-07 MED ORDER — ROSUVASTATIN CALCIUM 20 MG PO TABS
20.0000 mg | ORAL_TABLET | Freq: Every day | ORAL | 2 refills | Status: AC
  Filled 2024-09-07 – 2024-10-29 (×3): qty 90, 90d supply, fill #0

## 2024-09-27 ENCOUNTER — Other Ambulatory Visit: Payer: Self-pay

## 2024-09-27 ENCOUNTER — Other Ambulatory Visit (HOSPITAL_COMMUNITY): Payer: Self-pay

## 2024-09-28 ENCOUNTER — Other Ambulatory Visit: Payer: Self-pay

## 2024-10-05 ENCOUNTER — Other Ambulatory Visit (HOSPITAL_COMMUNITY): Payer: Self-pay

## 2024-10-11 ENCOUNTER — Ambulatory Visit (INDEPENDENT_AMBULATORY_CARE_PROVIDER_SITE_OTHER): Admitting: Physician Assistant

## 2024-10-11 NOTE — Progress Notes (Deleted)
" ° °  SUBJECTIVE: Discussed the use of AI scribe software for clinical note transcription with the patient, who gave verbal consent to proceed.  Chief Complaint: Obesity  Interim History: ***  Amy Cardenas is here to discuss her progress with her obesity treatment plan. She is on the {HWW Weight Loss Plan:210964005} and states she {CHL AMB IS/IS NOT:210130109} following her eating plan approximately *** % of the time. She states she {CHL AMB IS/IS NOT:210130109} exercising *** minutes *** times per week.   OBJECTIVE: Visit Diagnoses: Problem List Items Addressed This Visit     Diabetes mellitus (HCC) - Primary   Vitamin D  deficiency   Iron deficiency anemia   Hypertension associated with diabetes (HCC)   Hyperlipidemia associated with type 2 diabetes mellitus (HCC)   Morbid obesity (HCC) with starting BMI 57 (Chronic)    No data recorded No data recorded No data recorded No data recorded   ASSESSMENT AND PLAN:  Diet: Amy Cardenas {CHL AMB IS/IS NOT:210130109} currently in the action stage of change. As such, her goal is to {HWW Weight Loss Efforts:210964006}. She {HAS HAS WNU:81165} agreed to {HWW Weight Loss Plan:210964005}.  Exercise: Amy Cardenas has been instructed {HWW Exercise:210964007} for weight loss and overall health benefits.   Behavior Modification:  We discussed the following Behavioral Modification Strategies today: {HWW Behavior Modification:210964008}. We discussed various medication options to help Amy Cardenas with her weight loss efforts and we both agreed to ***.  No follow-ups on file.SABRA She was informed of the importance of frequent follow up visits to maximize her success with intensive lifestyle modifications for her multiple health conditions.  Attestation Statements:   Reviewed by clinician on day of visit: allergies, medications, problem list, medical history, surgical history, family history, social history, and previous encounter notes.   Time spent on visit including  pre-visit chart review and post-visit care and charting was *** minutes.    Ace Bergfeld, PA-C  "

## 2024-10-29 ENCOUNTER — Other Ambulatory Visit: Payer: Self-pay

## 2024-11-10 ENCOUNTER — Ambulatory Visit (INDEPENDENT_AMBULATORY_CARE_PROVIDER_SITE_OTHER): Admitting: Physician Assistant

## 2025-03-22 ENCOUNTER — Telehealth: Admitting: Adult Health
